# Patient Record
Sex: Female | Born: 1960
Health system: Southern US, Community
[De-identification: ages and names within clinical notes are randomized; demographics above are authoritative.]

## PROBLEM LIST (undated history)

## (undated) DIAGNOSIS — J449 Chronic obstructive pulmonary disease, unspecified: Secondary | ICD-10-CM

## (undated) DIAGNOSIS — I10 Essential (primary) hypertension: Secondary | ICD-10-CM

## (undated) DIAGNOSIS — I471 Supraventricular tachycardia, unspecified: Secondary | ICD-10-CM

## (undated) DIAGNOSIS — F32A Depression, unspecified: Secondary | ICD-10-CM

## (undated) DIAGNOSIS — J189 Pneumonia, unspecified organism: Secondary | ICD-10-CM

## (undated) DIAGNOSIS — R011 Cardiac murmur, unspecified: Secondary | ICD-10-CM

## (undated) DIAGNOSIS — Z86711 Personal history of pulmonary embolism: Secondary | ICD-10-CM

## (undated) DIAGNOSIS — F419 Anxiety disorder, unspecified: Secondary | ICD-10-CM

## (undated) DIAGNOSIS — F329 Major depressive disorder, single episode, unspecified: Secondary | ICD-10-CM

## (undated) DIAGNOSIS — I739 Peripheral vascular disease, unspecified: Secondary | ICD-10-CM

## (undated) DIAGNOSIS — I8290 Acute embolism and thrombosis of unspecified vein: Secondary | ICD-10-CM

## (undated) DIAGNOSIS — M545 Low back pain, unspecified: Secondary | ICD-10-CM

## (undated) DIAGNOSIS — M47817 Spondylosis without myelopathy or radiculopathy, lumbosacral region: Secondary | ICD-10-CM

## (undated) DIAGNOSIS — Z8739 Personal history of other diseases of the musculoskeletal system and connective tissue: Secondary | ICD-10-CM

## (undated) DIAGNOSIS — K219 Gastro-esophageal reflux disease without esophagitis: Secondary | ICD-10-CM

## (undated) DIAGNOSIS — M7661 Achilles tendinitis, right leg: Secondary | ICD-10-CM

## (undated) DIAGNOSIS — R740 Nonspecific elevation of levels of transaminase and lactic acid dehydrogenase [LDH]: Secondary | ICD-10-CM

## (undated) DIAGNOSIS — G8929 Other chronic pain: Secondary | ICD-10-CM

## (undated) DIAGNOSIS — R7401 Elevation of levels of liver transaminase levels: Secondary | ICD-10-CM

## (undated) DIAGNOSIS — I499 Cardiac arrhythmia, unspecified: Secondary | ICD-10-CM

## (undated) HISTORY — DX: Spondylosis without myelopathy or radiculopathy, lumbosacral region: M47.817

## (undated) HISTORY — DX: Nonspecific elevation of levels of transaminase and lactic acid dehydrogenase (ldh): R74.0

## (undated) HISTORY — DX: Low back pain: M54.5

## (undated) HISTORY — DX: Elevation of levels of liver transaminase levels: R74.01

## (undated) HISTORY — PX: TONSILLECTOMY: SUR1361

## (undated) HISTORY — PX: OTHER SURGICAL HISTORY: SHX169

## (undated) HISTORY — DX: Supraventricular tachycardia, unspecified: I47.10

## (undated) HISTORY — DX: Chronic obstructive pulmonary disease, unspecified: J44.9

## (undated) HISTORY — DX: Other chronic pain: G89.29

## (undated) HISTORY — PX: ELBOW SURGERY: SHX618

## (undated) HISTORY — DX: Achilles tendinitis, right leg: M76.61

## (undated) HISTORY — PX: ABDOMINAL HYSTERECTOMY: SHX81

## (undated) HISTORY — DX: Personal history of pulmonary embolism: Z86.711

## (undated) HISTORY — DX: Supraventricular tachycardia: I47.1

## (undated) HISTORY — DX: Low back pain, unspecified: M54.50

## (undated) HISTORY — DX: Personal history of other diseases of the musculoskeletal system and connective tissue: Z87.39

---

## 2002-02-18 ENCOUNTER — Emergency Department (HOSPITAL_COMMUNITY): Admission: EM | Admit: 2002-02-18 | Discharge: 2002-02-19 | Payer: Self-pay | Admitting: *Deleted

## 2005-05-14 ENCOUNTER — Emergency Department: Payer: Self-pay | Admitting: Emergency Medicine

## 2005-05-14 ENCOUNTER — Other Ambulatory Visit: Payer: Self-pay

## 2005-11-15 ENCOUNTER — Emergency Department: Payer: Self-pay | Admitting: Unknown Physician Specialty

## 2005-11-15 ENCOUNTER — Other Ambulatory Visit: Payer: Self-pay

## 2006-10-26 ENCOUNTER — Emergency Department: Payer: Self-pay | Admitting: Unknown Physician Specialty

## 2006-10-29 ENCOUNTER — Inpatient Hospital Stay: Payer: Self-pay | Admitting: Internal Medicine

## 2006-10-29 ENCOUNTER — Other Ambulatory Visit: Payer: Self-pay

## 2006-10-30 ENCOUNTER — Other Ambulatory Visit: Payer: Self-pay

## 2006-11-01 ENCOUNTER — Inpatient Hospital Stay: Payer: Self-pay | Admitting: Unknown Physician Specialty

## 2007-06-05 ENCOUNTER — Emergency Department: Payer: Self-pay | Admitting: Emergency Medicine

## 2007-06-05 ENCOUNTER — Other Ambulatory Visit: Payer: Self-pay

## 2007-06-10 ENCOUNTER — Emergency Department: Payer: Self-pay | Admitting: Emergency Medicine

## 2007-06-19 ENCOUNTER — Ambulatory Visit: Payer: Self-pay | Admitting: Pain Medicine

## 2007-08-02 ENCOUNTER — Ambulatory Visit: Payer: Self-pay | Admitting: Family Medicine

## 2008-09-25 ENCOUNTER — Emergency Department: Payer: Self-pay

## 2009-01-31 ENCOUNTER — Emergency Department: Payer: Self-pay | Admitting: Internal Medicine

## 2009-07-23 ENCOUNTER — Emergency Department: Payer: Self-pay | Admitting: Emergency Medicine

## 2009-09-03 ENCOUNTER — Ambulatory Visit: Payer: Self-pay | Admitting: Family Medicine

## 2010-08-17 ENCOUNTER — Inpatient Hospital Stay: Payer: Self-pay | Admitting: Internal Medicine

## 2010-09-04 ENCOUNTER — Emergency Department: Payer: Self-pay | Admitting: Emergency Medicine

## 2010-09-14 ENCOUNTER — Ambulatory Visit: Payer: Self-pay | Admitting: Internal Medicine

## 2010-10-21 ENCOUNTER — Ambulatory Visit: Payer: Self-pay | Admitting: Obstetrics and Gynecology

## 2010-11-02 ENCOUNTER — Ambulatory Visit: Payer: Self-pay

## 2010-12-24 ENCOUNTER — Emergency Department: Payer: Self-pay | Admitting: Unknown Physician Specialty

## 2011-01-20 ENCOUNTER — Ambulatory Visit: Payer: Self-pay | Admitting: Internal Medicine

## 2011-01-20 ENCOUNTER — Emergency Department: Payer: Self-pay | Admitting: Emergency Medicine

## 2011-05-02 ENCOUNTER — Ambulatory Visit: Payer: Self-pay | Admitting: Internal Medicine

## 2011-05-02 ENCOUNTER — Ambulatory Visit: Payer: Self-pay

## 2011-05-12 ENCOUNTER — Emergency Department: Payer: Self-pay | Admitting: Emergency Medicine

## 2011-05-16 ENCOUNTER — Ambulatory Visit: Payer: Self-pay | Admitting: Internal Medicine

## 2011-06-15 ENCOUNTER — Ambulatory Visit: Payer: Self-pay | Admitting: Internal Medicine

## 2011-07-16 ENCOUNTER — Ambulatory Visit: Payer: Self-pay | Admitting: Internal Medicine

## 2011-08-03 ENCOUNTER — Observation Stay: Payer: Self-pay | Admitting: Specialist

## 2011-09-14 ENCOUNTER — Ambulatory Visit: Payer: Self-pay | Admitting: Internal Medicine

## 2011-09-15 ENCOUNTER — Ambulatory Visit: Payer: Self-pay | Admitting: Internal Medicine

## 2011-10-15 ENCOUNTER — Ambulatory Visit: Payer: Self-pay | Admitting: Internal Medicine

## 2011-11-15 DIAGNOSIS — I739 Peripheral vascular disease, unspecified: Secondary | ICD-10-CM

## 2011-11-15 HISTORY — DX: Peripheral vascular disease, unspecified: I73.9

## 2012-05-17 ENCOUNTER — Emergency Department: Payer: Self-pay | Admitting: *Deleted

## 2012-05-20 ENCOUNTER — Emergency Department: Payer: Self-pay | Admitting: Emergency Medicine

## 2012-05-20 LAB — COMPREHENSIVE METABOLIC PANEL
Anion Gap: 7 (ref 7–16)
BUN: 9 mg/dL (ref 7–18)
Bilirubin,Total: 0.4 mg/dL (ref 0.2–1.0)
Chloride: 109 mmol/L — ABNORMAL HIGH (ref 98–107)
Creatinine: 1.13 mg/dL (ref 0.60–1.30)
EGFR (African American): >60
Osmolality: 276 (ref 275–301)
Potassium: 3.3 mmol/L — ABNORMAL LOW (ref 3.5–5.1)
SGPT (ALT): 39 U/L
Sodium: 139 mmol/L (ref 136–145)
Total Protein: 7 g/dL (ref 6.4–8.2)

## 2012-05-20 LAB — CBC
HCT: 35.1 % (ref 35.0–47.0)
MCHC: 34.1 g/dL (ref 32.0–36.0)
RBC: 3.88 10*6/uL (ref 3.80–5.20)
RDW: 13.9 % (ref 11.5–14.5)
WBC: 7.6 10*3/uL (ref 3.6–11.0)

## 2012-08-01 ENCOUNTER — Ambulatory Visit: Payer: Self-pay | Admitting: Urology

## 2012-08-02 ENCOUNTER — Ambulatory Visit: Payer: Self-pay

## 2012-08-10 ENCOUNTER — Ambulatory Visit: Payer: Self-pay | Admitting: Urology

## 2012-10-03 ENCOUNTER — Emergency Department: Payer: Self-pay | Admitting: Internal Medicine

## 2012-10-03 LAB — CBC
HCT: 36.4 % (ref 35.0–47.0)
HGB: 12.1 g/dL (ref 12.0–16.0)
MCH: 27.3 pg (ref 26.0–34.0)
MCHC: 33.3 g/dL (ref 32.0–36.0)
MCV: 82 fL (ref 80–100)
Platelet: 277 10*3/uL (ref 150–440)
RBC: 4.44 10*6/uL (ref 3.80–5.20)
RDW: 15.6 % — ABNORMAL HIGH (ref 11.5–14.5)
WBC: 8.3 10*3/uL (ref 3.6–11.0)

## 2012-10-03 LAB — URINALYSIS, COMPLETE
Nitrite: NEGATIVE
Protein: NEGATIVE
RBC,UR: 3 /HPF (ref 0–5)
Specific Gravity: 1.01 (ref 1.003–1.030)
WBC UR: 8 /HPF (ref 0–5)

## 2012-10-03 LAB — COMPREHENSIVE METABOLIC PANEL
Albumin: 3.7 g/dL (ref 3.4–5.0)
Alkaline Phosphatase: 130 U/L (ref 50–136)
BUN: 6 mg/dL — ABNORMAL LOW (ref 7–18)
Bilirubin,Total: 0.4 mg/dL (ref 0.2–1.0)
Co2: 21 mmol/L (ref 21–32)
Glucose: 128 mg/dL — ABNORMAL HIGH (ref 65–99)
Osmolality: 271 (ref 275–301)
SGOT(AST): 27 U/L (ref 15–37)
Sodium: 136 mmol/L (ref 136–145)
Total Protein: 7.9 g/dL (ref 6.4–8.2)

## 2012-10-03 LAB — DRUG SCREEN, URINE
Amphetamines, Ur Screen: NEGATIVE (ref ?–1000)
Barbiturates, Ur Screen: NEGATIVE (ref ?–200)
Cannabinoid 50 Ng, Ur ~~LOC~~: NEGATIVE (ref ?–50)
Methadone, Ur Screen: NEGATIVE (ref ?–300)
Opiate, Ur Screen: POSITIVE (ref ?–300)
Phencyclidine (PCP) Ur S: NEGATIVE (ref ?–25)
Tricyclic, Ur Screen: NEGATIVE (ref ?–1000)

## 2012-10-04 LAB — URINE CULTURE

## 2012-10-24 ENCOUNTER — Ambulatory Visit: Payer: Self-pay | Admitting: Urology

## 2012-10-24 LAB — POTASSIUM: Potassium: 4.1 mmol/L (ref 3.5–5.1)

## 2012-10-29 ENCOUNTER — Ambulatory Visit: Payer: Self-pay | Admitting: Urology

## 2012-11-02 ENCOUNTER — Emergency Department: Payer: Self-pay | Admitting: Emergency Medicine

## 2012-11-02 LAB — URINALYSIS, COMPLETE
Bilirubin,UR: NEGATIVE
Glucose,UR: NEGATIVE mg/dL (ref 0–75)
Ketone: NEGATIVE
Nitrite: POSITIVE
Protein: 30
RBC,UR: 105 /HPF (ref 0–5)
WBC UR: 56 /HPF (ref 0–5)

## 2012-11-02 LAB — COMPREHENSIVE METABOLIC PANEL
Albumin: 3.6 g/dL (ref 3.4–5.0)
Alkaline Phosphatase: 101 U/L (ref 50–136)
Anion Gap: 6 — ABNORMAL LOW (ref 7–16)
BUN: 9 mg/dL (ref 7–18)
Calcium, Total: 8.8 mg/dL (ref 8.5–10.1)
Co2: 21 mmol/L (ref 21–32)
EGFR (Non-African Amer.): >60
Glucose: 99 mg/dL (ref 65–99)
Osmolality: 280 (ref 275–301)
SGPT (ALT): 27 U/L (ref 12–78)

## 2012-11-02 LAB — CBC
MCV: 82 fL (ref 80–100)
Platelet: 375 10*3/uL (ref 150–440)
RBC: 4.14 10*6/uL (ref 3.80–5.20)
WBC: 8.3 10*3/uL (ref 3.6–11.0)

## 2012-11-02 LAB — LIPASE, BLOOD: Lipase: 89 U/L (ref 73–393)

## 2013-01-08 ENCOUNTER — Ambulatory Visit: Payer: Medicare Other | Admitting: Family Medicine

## 2013-01-08 DIAGNOSIS — Z0289 Encounter for other administrative examinations: Secondary | ICD-10-CM

## 2013-05-21 ENCOUNTER — Emergency Department: Payer: Self-pay | Admitting: Emergency Medicine

## 2013-05-21 LAB — URINALYSIS, COMPLETE
Bilirubin,UR: NEGATIVE
Glucose,UR: NEGATIVE mg/dL (ref 0–75)
Ketone: NEGATIVE
Ph: 5 (ref 4.5–8.0)
Protein: 30
RBC,UR: 4 /HPF (ref 0–5)
Specific Gravity: 1.017 (ref 1.003–1.030)
Squamous Epithelial: 2

## 2013-05-21 LAB — TROPONIN I: Troponin-I: <0.02 ng/mL

## 2013-05-21 LAB — COMPREHENSIVE METABOLIC PANEL
Albumin: 3.4 g/dL (ref 3.4–5.0)
Alkaline Phosphatase: 86 U/L (ref 50–136)
BUN: 10 mg/dL (ref 7–18)
Bilirubin,Total: 0.3 mg/dL (ref 0.2–1.0)
Co2: 26 mmol/L (ref 21–32)
EGFR (African American): >60
Glucose: 108 mg/dL — ABNORMAL HIGH (ref 65–99)
SGOT(AST): 17 U/L (ref 15–37)
SGPT (ALT): 15 U/L (ref 12–78)
Sodium: 138 mmol/L (ref 136–145)
Total Protein: 7.5 g/dL (ref 6.4–8.2)

## 2013-05-21 LAB — CBC
HCT: 26.3 % — ABNORMAL LOW (ref 35.0–47.0)
HGB: 7.9 g/dL — ABNORMAL LOW (ref 12.0–16.0)
MCH: 21.8 pg — ABNORMAL LOW (ref 26.0–34.0)
Platelet: 383 10*3/uL (ref 150–440)
RBC: 3.6 10*6/uL — ABNORMAL LOW (ref 3.80–5.20)
RDW: 18.7 % — ABNORMAL HIGH (ref 11.5–14.5)
WBC: 15.2 10*3/uL — ABNORMAL HIGH (ref 3.6–11.0)

## 2013-05-23 LAB — URINE CULTURE

## 2013-07-18 ENCOUNTER — Emergency Department: Payer: Self-pay | Admitting: Emergency Medicine

## 2013-07-18 LAB — BASIC METABOLIC PANEL
Anion Gap: 7 (ref 7–16)
BUN: 13 mg/dL (ref 7–18)
Calcium, Total: 7.8 mg/dL — ABNORMAL LOW (ref 8.5–10.1)
Chloride: 107 mmol/L (ref 98–107)
Co2: 25 mmol/L (ref 21–32)
EGFR (African American): >60
Glucose: 104 mg/dL — ABNORMAL HIGH (ref 65–99)
Potassium: 3.3 mmol/L — ABNORMAL LOW (ref 3.5–5.1)
Sodium: 139 mmol/L (ref 136–145)

## 2013-07-18 LAB — CBC
MCV: 69 fL — ABNORMAL LOW (ref 80–100)
Platelet: 351 10*3/uL (ref 150–440)
RBC: 3.38 10*6/uL — ABNORMAL LOW (ref 3.80–5.20)

## 2013-07-18 LAB — TROPONIN I: Troponin-I: <0.02 ng/mL

## 2013-08-16 ENCOUNTER — Emergency Department: Payer: Self-pay | Admitting: Emergency Medicine

## 2013-09-16 ENCOUNTER — Emergency Department: Payer: Self-pay | Admitting: Emergency Medicine

## 2013-09-17 LAB — ETHANOL
Ethanol %: 0.173 % — ABNORMAL HIGH (ref 0.000–0.080)
Ethanol: 173 mg/dL

## 2013-09-17 LAB — URINALYSIS, COMPLETE
Bilirubin,UR: NEGATIVE
Glucose,UR: NEGATIVE mg/dL (ref 0–75)
Ketone: NEGATIVE
Leukocyte Esterase: NEGATIVE
Ph: 5 (ref 4.5–8.0)
Protein: NEGATIVE
RBC,UR: <1 /HPF (ref 0–5)
Specific Gravity: 1.008 (ref 1.003–1.030)
Squamous Epithelial: <1
WBC UR: 1 /HPF (ref 0–5)

## 2013-09-17 LAB — COMPREHENSIVE METABOLIC PANEL
Albumin: 3.6 g/dL (ref 3.4–5.0)
Alkaline Phosphatase: 83 U/L (ref 50–136)
Anion Gap: 6 — ABNORMAL LOW (ref 7–16)
BUN: 8 mg/dL (ref 7–18)
Bilirubin,Total: 0.1 mg/dL — ABNORMAL LOW (ref 0.2–1.0)
Co2: 22 mmol/L (ref 21–32)
EGFR (African American): >60
Glucose: 82 mg/dL (ref 65–99)
Osmolality: 282 (ref 275–301)
Potassium: 3.8 mmol/L (ref 3.5–5.1)
SGPT (ALT): 20 U/L (ref 12–78)

## 2013-09-17 LAB — CBC
HCT: 24.9 % — ABNORMAL LOW (ref 35.0–47.0)
HGB: 8 g/dL — ABNORMAL LOW (ref 12.0–16.0)
MCH: 21 pg — ABNORMAL LOW (ref 26.0–34.0)
Platelet: 352 10*3/uL (ref 150–440)
RBC: 3.82 10*6/uL (ref 3.80–5.20)
WBC: 5.7 10*3/uL (ref 3.6–11.0)

## 2013-11-25 ENCOUNTER — Emergency Department: Payer: Self-pay | Admitting: Emergency Medicine

## 2013-11-25 LAB — CBC WITH DIFFERENTIAL/PLATELET
Basophil #: 0.1 10*3/uL (ref 0.0–0.1)
Basophil %: 1.1 %
Eosinophil #: 0 10*3/uL (ref 0.0–0.7)
Eosinophil %: 0.1 %
HCT: 26 % — AB (ref 35.0–47.0)
HGB: 8.2 g/dL — AB (ref 12.0–16.0)
Lymphocyte #: 3.1 10*3/uL (ref 1.0–3.6)
Lymphocyte %: 32.9 %
MCH: 20.1 pg — ABNORMAL LOW (ref 26.0–34.0)
MCHC: 31.6 g/dL — AB (ref 32.0–36.0)
MCV: 64 fL — ABNORMAL LOW (ref 80–100)
Monocyte #: 0.6 x10 3/mm (ref 0.2–0.9)
Monocyte %: 6.3 %
NEUTROS ABS: 5.6 10*3/uL (ref 1.4–6.5)
NEUTROS PCT: 59.6 %
PLATELETS: 354 10*3/uL (ref 150–440)
RBC: 4.1 10*6/uL (ref 3.80–5.20)
RDW: 20.2 % — ABNORMAL HIGH (ref 11.5–14.5)
WBC: 9.4 10*3/uL (ref 3.6–11.0)

## 2013-11-25 LAB — BASIC METABOLIC PANEL
ANION GAP: 6 — AB (ref 7–16)
BUN: 9 mg/dL (ref 7–18)
CALCIUM: 8.3 mg/dL — AB (ref 8.5–10.1)
Chloride: 109 mmol/L — ABNORMAL HIGH (ref 98–107)
Co2: 24 mmol/L (ref 21–32)
Creatinine: 0.73 mg/dL (ref 0.60–1.30)
EGFR (African American): >60
EGFR (Non-African Amer.): >60
Glucose: 102 mg/dL — ABNORMAL HIGH (ref 65–99)
OSMOLALITY: 276 (ref 275–301)
Potassium: 3.7 mmol/L (ref 3.5–5.1)
Sodium: 139 mmol/L (ref 136–145)

## 2013-11-25 LAB — RAPID INFLUENZA A&B ANTIGENS

## 2013-11-25 LAB — TROPONIN I

## 2013-11-27 ENCOUNTER — Emergency Department: Payer: Self-pay | Admitting: Emergency Medicine

## 2013-11-27 LAB — URINALYSIS, COMPLETE
BACTERIA: NONE SEEN
Bilirubin,UR: NEGATIVE
Blood: NEGATIVE
Glucose,UR: NEGATIVE mg/dL (ref 0–75)
Ketone: NEGATIVE
LEUKOCYTE ESTERASE: NEGATIVE
NITRITE: NEGATIVE
Ph: 6 (ref 4.5–8.0)
Protein: NEGATIVE
RBC,UR: <1 /HPF (ref 0–5)
Specific Gravity: 1.005 (ref 1.003–1.030)
Squamous Epithelial: 2
WBC UR: NONE SEEN /HPF (ref 0–5)

## 2013-11-27 LAB — CBC WITH DIFFERENTIAL/PLATELET
Basophil #: 0.1 10*3/uL (ref 0.0–0.1)
Basophil %: 0.5 %
Eosinophil #: 0 10*3/uL (ref 0.0–0.7)
Eosinophil %: 0 %
HCT: 24.9 % — ABNORMAL LOW (ref 35.0–47.0)
HGB: 7.7 g/dL — ABNORMAL LOW (ref 12.0–16.0)
LYMPHS ABS: 1.6 10*3/uL (ref 1.0–3.6)
Lymphocyte %: 10.9 %
MCH: 19.7 pg — ABNORMAL LOW (ref 26.0–34.0)
MCHC: 31 g/dL — AB (ref 32.0–36.0)
MCV: 64 fL — ABNORMAL LOW (ref 80–100)
MONOS PCT: 1.9 %
Monocyte #: 0.3 x10 3/mm (ref 0.2–0.9)
Neutrophil #: 13 10*3/uL — ABNORMAL HIGH (ref 1.4–6.5)
Neutrophil %: 86.7 %
Platelet: 424 10*3/uL (ref 150–440)
RBC: 3.92 10*6/uL (ref 3.80–5.20)
RDW: 20.2 % — ABNORMAL HIGH (ref 11.5–14.5)
WBC: 15 10*3/uL — ABNORMAL HIGH (ref 3.6–11.0)

## 2013-11-27 LAB — BASIC METABOLIC PANEL
Anion Gap: 7 (ref 7–16)
BUN: 7 mg/dL (ref 7–18)
CO2: 22 mmol/L (ref 21–32)
Calcium, Total: 8.8 mg/dL (ref 8.5–10.1)
Chloride: 108 mmol/L — ABNORMAL HIGH (ref 98–107)
Creatinine: 0.75 mg/dL (ref 0.60–1.30)
EGFR (African American): >60
Glucose: 88 mg/dL (ref 65–99)
OSMOLALITY: 271 (ref 275–301)
Potassium: 4.1 mmol/L (ref 3.5–5.1)
Sodium: 137 mmol/L (ref 136–145)

## 2013-11-27 LAB — TROPONIN I: Troponin-I: <0.02 ng/mL

## 2014-01-15 ENCOUNTER — Ambulatory Visit: Payer: Self-pay | Admitting: Internal Medicine

## 2014-01-15 LAB — CANCER CENTER HEMOGLOBIN: HGB: 8 g/dL — ABNORMAL LOW (ref 12.0–16.0)

## 2014-01-15 LAB — FERRITIN: Ferritin (ARMC): 4 ng/mL — ABNORMAL LOW (ref 8–388)

## 2014-01-24 ENCOUNTER — Emergency Department: Payer: Self-pay | Admitting: Emergency Medicine

## 2014-01-24 LAB — CBC WITH DIFFERENTIAL/PLATELET
BASOS ABS: 0.1 10*3/uL (ref 0.0–0.1)
BASOS PCT: 1.4 %
EOS PCT: 0 %
Eosinophil #: 0 10*3/uL (ref 0.0–0.7)
HCT: 29.5 % — AB (ref 35.0–47.0)
HGB: 9.3 g/dL — AB (ref 12.0–16.0)
Lymphocyte #: 1.8 10*3/uL (ref 1.0–3.6)
Lymphocyte %: 39.8 %
MCH: 21.8 pg — ABNORMAL LOW (ref 26.0–34.0)
MCHC: 31.4 g/dL — AB (ref 32.0–36.0)
MCV: 70 fL — ABNORMAL LOW (ref 80–100)
Monocyte #: 0.3 x10 3/mm (ref 0.2–0.9)
Monocyte %: 7.7 %
Neutrophil #: 2.3 10*3/uL (ref 1.4–6.5)
Neutrophil %: 51.1 %
PLATELETS: 359 10*3/uL (ref 150–440)
RBC: 4.24 10*6/uL (ref 3.80–5.20)
RDW: 28 % — AB (ref 11.5–14.5)
WBC: 4.5 10*3/uL (ref 3.6–11.0)

## 2014-01-24 LAB — COMPREHENSIVE METABOLIC PANEL
ALT: 21 U/L (ref 12–78)
Albumin: 4 g/dL (ref 3.4–5.0)
Alkaline Phosphatase: 73 U/L
Anion Gap: 6 — ABNORMAL LOW (ref 7–16)
BUN: 9 mg/dL (ref 7–18)
Bilirubin,Total: 0.2 mg/dL (ref 0.2–1.0)
CHLORIDE: 111 mmol/L — AB (ref 98–107)
Calcium, Total: 8.3 mg/dL — ABNORMAL LOW (ref 8.5–10.1)
Co2: 24 mmol/L (ref 21–32)
Creatinine: 0.78 mg/dL (ref 0.60–1.30)
EGFR (African American): >60
EGFR (Non-African Amer.): >60
Glucose: 84 mg/dL (ref 65–99)
OSMOLALITY: 279 (ref 275–301)
Potassium: 3.9 mmol/L (ref 3.5–5.1)
SGOT(AST): 21 U/L (ref 15–37)
Sodium: 141 mmol/L (ref 136–145)
Total Protein: 7.4 g/dL (ref 6.4–8.2)

## 2014-01-24 LAB — URINALYSIS, COMPLETE
Bilirubin,UR: NEGATIVE
Glucose,UR: NEGATIVE mg/dL (ref 0–75)
KETONE: NEGATIVE
Nitrite: NEGATIVE
PH: 5 (ref 4.5–8.0)
Protein: NEGATIVE
RBC,UR: 1 /HPF (ref 0–5)
Specific Gravity: 1.023 (ref 1.003–1.030)
Squamous Epithelial: 7

## 2014-01-24 LAB — CANCER CENTER HEMOGLOBIN: HGB: 8.6 g/dL — ABNORMAL LOW (ref 12.0–16.0)

## 2014-01-24 LAB — FERRITIN: Ferritin (ARMC): 104 ng/mL (ref 8–388)

## 2014-01-26 LAB — URINE CULTURE

## 2014-02-03 LAB — CANCER CENTER HEMOGLOBIN: HGB: 9.3 g/dL — AB (ref 12.0–16.0)

## 2014-02-03 LAB — FERRITIN: FERRITIN (ARMC): 37 ng/mL (ref 8–388)

## 2014-02-04 LAB — CBC CANCER CENTER
Basophil #: 0.1 x10 3/mm (ref 0.0–0.1)
Basophil %: 1.7 %
EOS PCT: 0 %
Eosinophil #: 0 x10 3/mm (ref 0.0–0.7)
HCT: 32.1 % — AB (ref 35.0–47.0)
HGB: 9.8 g/dL — ABNORMAL LOW (ref 12.0–16.0)
LYMPHS PCT: 42.6 %
Lymphocyte #: 2.3 x10 3/mm (ref 1.0–3.6)
MCH: 21.8 pg — AB (ref 26.0–34.0)
MCHC: 30.5 g/dL — ABNORMAL LOW (ref 32.0–36.0)
MCV: 72 fL — ABNORMAL LOW (ref 80–100)
Monocyte #: 0.4 x10 3/mm (ref 0.2–0.9)
Monocyte %: 7.2 %
NEUTROS PCT: 48.5 %
Neutrophil #: 2.6 x10 3/mm (ref 1.4–6.5)
Platelet: 538 x10 3/mm — ABNORMAL HIGH (ref 150–440)
RBC: 4.49 10*6/uL (ref 3.80–5.20)
RDW: 30 % — ABNORMAL HIGH (ref 11.5–14.5)
WBC: 5.4 x10 3/mm (ref 3.6–11.0)

## 2014-02-11 LAB — PATHOLOGY REPORT

## 2014-02-12 ENCOUNTER — Ambulatory Visit: Payer: Self-pay | Admitting: Internal Medicine

## 2014-02-13 ENCOUNTER — Ambulatory Visit: Payer: Self-pay | Admitting: Internal Medicine

## 2014-02-13 LAB — CANCER CENTER HEMOGLOBIN: HGB: 10.3 g/dL — ABNORMAL LOW (ref 12.0–16.0)

## 2014-02-13 LAB — FERRITIN: FERRITIN (ARMC): 14 ng/mL (ref 8–388)

## 2014-03-05 ENCOUNTER — Ambulatory Visit: Payer: Self-pay | Admitting: Gynecologic Oncology

## 2014-03-05 LAB — BASIC METABOLIC PANEL
Anion Gap: 6 — ABNORMAL LOW (ref 7–16)
BUN: 6 mg/dL — ABNORMAL LOW (ref 7–18)
CREATININE: 0.74 mg/dL (ref 0.60–1.30)
Calcium, Total: 9.4 mg/dL (ref 8.5–10.1)
Chloride: 109 mmol/L — ABNORMAL HIGH (ref 98–107)
Co2: 25 mmol/L (ref 21–32)
EGFR (Non-African Amer.): >60
GLUCOSE: 82 mg/dL (ref 65–99)
Osmolality: 276 (ref 275–301)
POTASSIUM: 3.7 mmol/L (ref 3.5–5.1)
Sodium: 140 mmol/L (ref 136–145)

## 2014-03-05 LAB — CBC
HCT: 37.6 % (ref 35.0–47.0)
HGB: 11.7 g/dL — ABNORMAL LOW (ref 12.0–16.0)
MCH: 23.2 pg — ABNORMAL LOW (ref 26.0–34.0)
MCHC: 31.1 g/dL — ABNORMAL LOW (ref 32.0–36.0)
MCV: 75 fL — ABNORMAL LOW (ref 80–100)
PLATELETS: 395 10*3/uL (ref 150–440)
RBC: 5.05 10*6/uL (ref 3.80–5.20)
RDW: 24.4 % — ABNORMAL HIGH (ref 11.5–14.5)
WBC: 7 10*3/uL (ref 3.6–11.0)

## 2014-03-11 ENCOUNTER — Ambulatory Visit: Payer: Self-pay | Admitting: Gynecologic Oncology

## 2014-03-13 LAB — PATHOLOGY REPORT

## 2014-03-14 ENCOUNTER — Ambulatory Visit: Payer: Self-pay | Admitting: Internal Medicine

## 2014-03-25 ENCOUNTER — Ambulatory Visit: Payer: Self-pay | Admitting: Gynecologic Oncology

## 2014-03-25 LAB — CBC
HCT: 33.7 % — AB (ref 35.0–47.0)
HGB: 10.6 g/dL — AB (ref 12.0–16.0)
MCH: 24 pg — ABNORMAL LOW (ref 26.0–34.0)
MCHC: 31.5 g/dL — ABNORMAL LOW (ref 32.0–36.0)
MCV: 76 fL — AB (ref 80–100)
Platelet: 354 10*3/uL (ref 150–440)
RBC: 4.43 10*6/uL (ref 3.80–5.20)
RDW: 21.8 % — AB (ref 11.5–14.5)
WBC: 5.7 10*3/uL (ref 3.6–11.0)

## 2014-03-25 LAB — BASIC METABOLIC PANEL
Anion Gap: 6 — ABNORMAL LOW (ref 7–16)
BUN: 8 mg/dL (ref 7–18)
CALCIUM: 8.6 mg/dL (ref 8.5–10.1)
CO2: 25 mmol/L (ref 21–32)
Chloride: 108 mmol/L — ABNORMAL HIGH (ref 98–107)
Creatinine: 1.01 mg/dL (ref 0.60–1.30)
EGFR (Non-African Amer.): >60
GLUCOSE: 85 mg/dL (ref 65–99)
Osmolality: 275 (ref 275–301)
POTASSIUM: 4.4 mmol/L (ref 3.5–5.1)
SODIUM: 139 mmol/L (ref 136–145)

## 2014-03-26 LAB — HEMOGLOBIN: HGB: 10 g/dL — AB (ref 12.0–16.0)

## 2014-03-31 LAB — PATHOLOGY REPORT

## 2014-04-08 ENCOUNTER — Ambulatory Visit: Payer: Self-pay | Admitting: Internal Medicine

## 2014-04-08 LAB — URINALYSIS, COMPLETE
BACTERIA: NONE SEEN
BILIRUBIN, UR: NEGATIVE
Glucose,UR: NEGATIVE mg/dL (ref 0–75)
KETONE: NEGATIVE
Nitrite: NEGATIVE
Ph: 5 (ref 4.5–8.0)
Protein: NEGATIVE
RBC,UR: 13 /HPF (ref 0–5)
Specific Gravity: 1.024 (ref 1.003–1.030)
Squamous Epithelial: 5

## 2014-04-10 LAB — URINE CULTURE

## 2014-04-14 ENCOUNTER — Ambulatory Visit: Payer: Self-pay | Admitting: Internal Medicine

## 2014-05-15 ENCOUNTER — Ambulatory Visit: Payer: Self-pay | Admitting: Internal Medicine

## 2014-06-09 ENCOUNTER — Observation Stay: Payer: Self-pay | Admitting: Internal Medicine

## 2014-06-09 LAB — BASIC METABOLIC PANEL
Anion Gap: 10 (ref 7–16)
BUN: 8 mg/dL (ref 7–18)
Calcium, Total: 8.6 mg/dL (ref 8.5–10.1)
Chloride: 107 mmol/L (ref 98–107)
Co2: 24 mmol/L (ref 21–32)
Creatinine: 0.77 mg/dL (ref 0.60–1.30)
EGFR (African American): >60
EGFR (Non-African Amer.): >60
GLUCOSE: 94 mg/dL (ref 65–99)
Osmolality: 279 (ref 275–301)
Potassium: 3.9 mmol/L (ref 3.5–5.1)
Sodium: 141 mmol/L (ref 136–145)

## 2014-06-09 LAB — TROPONIN I
Troponin-I: <0.02 ng/mL
Troponin-I: <0.02 ng/mL
Troponin-I: <0.02 ng/mL

## 2014-06-09 LAB — CBC
HCT: 33.3 % — ABNORMAL LOW (ref 35.0–47.0)
HGB: 10.7 g/dL — ABNORMAL LOW (ref 12.0–16.0)
MCH: 25.2 pg — ABNORMAL LOW (ref 26.0–34.0)
MCHC: 32.1 g/dL (ref 32.0–36.0)
MCV: 79 fL — ABNORMAL LOW (ref 80–100)
Platelet: 377 10*3/uL (ref 150–440)
RBC: 4.24 10*6/uL (ref 3.80–5.20)
RDW: 19.6 % — ABNORMAL HIGH (ref 11.5–14.5)
WBC: 10.5 10*3/uL (ref 3.6–11.0)

## 2014-06-09 LAB — PROTIME-INR
INR: 1.2
PROTHROMBIN TIME: 15.2 s — AB (ref 11.5–14.7)

## 2014-06-09 LAB — PRO B NATRIURETIC PEPTIDE: B-Type Natriuretic Peptide: 425 pg/mL — ABNORMAL HIGH (ref 0–125)

## 2014-06-10 DIAGNOSIS — I369 Nonrheumatic tricuspid valve disorder, unspecified: Secondary | ICD-10-CM

## 2014-06-10 LAB — BASIC METABOLIC PANEL
Anion Gap: 11 (ref 7–16)
BUN: 9 mg/dL (ref 7–18)
CHLORIDE: 106 mmol/L (ref 98–107)
Calcium, Total: 8 mg/dL — ABNORMAL LOW (ref 8.5–10.1)
Co2: 23 mmol/L (ref 21–32)
Creatinine: 0.91 mg/dL (ref 0.60–1.30)
EGFR (African American): >60
EGFR (Non-African Amer.): >60
Glucose: 87 mg/dL (ref 65–99)
Osmolality: 277 (ref 275–301)
POTASSIUM: 3.5 mmol/L (ref 3.5–5.1)
SODIUM: 140 mmol/L (ref 136–145)

## 2014-06-10 LAB — CBC WITH DIFFERENTIAL/PLATELET
BASOS ABS: 0.1 10*3/uL (ref 0.0–0.1)
BASOS PCT: 0.9 %
Eosinophil #: 0.1 10*3/uL (ref 0.0–0.7)
Eosinophil %: 0.9 %
HCT: 32.5 % — ABNORMAL LOW (ref 35.0–47.0)
HGB: 10.4 g/dL — ABNORMAL LOW (ref 12.0–16.0)
LYMPHS ABS: 2.3 10*3/uL (ref 1.0–3.6)
LYMPHS PCT: 35.6 %
MCH: 25.2 pg — ABNORMAL LOW (ref 26.0–34.0)
MCHC: 31.9 g/dL — ABNORMAL LOW (ref 32.0–36.0)
MCV: 79 fL — ABNORMAL LOW (ref 80–100)
Monocyte #: 0.4 x10 3/mm (ref 0.2–0.9)
Monocyte %: 6.6 %
NEUTROS PCT: 56 %
Neutrophil #: 3.7 10*3/uL (ref 1.4–6.5)
PLATELETS: 325 10*3/uL (ref 150–440)
RBC: 4.11 10*6/uL (ref 3.80–5.20)
RDW: 19.9 % — AB (ref 11.5–14.5)
WBC: 6.5 10*3/uL (ref 3.6–11.0)

## 2014-07-29 ENCOUNTER — Ambulatory Visit: Payer: Self-pay | Admitting: Internal Medicine

## 2014-09-19 ENCOUNTER — Inpatient Hospital Stay: Payer: Self-pay | Admitting: Internal Medicine

## 2014-09-19 LAB — COMPREHENSIVE METABOLIC PANEL
ALBUMIN: 3.8 g/dL (ref 3.4–5.0)
AST: 186 U/L — AB (ref 15–37)
Alkaline Phosphatase: 292 U/L — ABNORMAL HIGH
Anion Gap: 8 (ref 7–16)
BILIRUBIN TOTAL: 0.6 mg/dL (ref 0.2–1.0)
BUN: 8 mg/dL (ref 7–18)
CHLORIDE: 107 mmol/L (ref 98–107)
Calcium, Total: 8.8 mg/dL (ref 8.5–10.1)
Co2: 25 mmol/L (ref 21–32)
Creatinine: 0.94 mg/dL (ref 0.60–1.30)
EGFR (African American): >60
EGFR (Non-African Amer.): >60
GLUCOSE: 117 mg/dL — AB (ref 65–99)
Osmolality: 279 (ref 275–301)
Potassium: 3.7 mmol/L (ref 3.5–5.1)
SGPT (ALT): 275 U/L — ABNORMAL HIGH
SODIUM: 140 mmol/L (ref 136–145)
Total Protein: 7.9 g/dL (ref 6.4–8.2)

## 2014-09-19 LAB — CBC WITH DIFFERENTIAL/PLATELET
BASOS ABS: 0 10*3/uL (ref 0.0–0.1)
Basophil %: 0.3 %
Eosinophil #: 0 10*3/uL (ref 0.0–0.7)
Eosinophil %: 0.1 %
HCT: 42.8 % (ref 35.0–47.0)
HGB: 13.9 g/dL (ref 12.0–16.0)
LYMPHS ABS: 0.9 10*3/uL — AB (ref 1.0–3.6)
Lymphocyte %: 8 %
MCH: 27.8 pg (ref 26.0–34.0)
MCHC: 32.5 g/dL (ref 32.0–36.0)
MCV: 85 fL (ref 80–100)
MONO ABS: 0.3 x10 3/mm (ref 0.2–0.9)
Monocyte %: 2.9 %
Neutrophil #: 10 10*3/uL — ABNORMAL HIGH (ref 1.4–6.5)
Neutrophil %: 88.7 %
Platelet: 369 10*3/uL (ref 150–440)
RBC: 5.01 10*6/uL (ref 3.80–5.20)
RDW: 17.5 % — ABNORMAL HIGH (ref 11.5–14.5)
WBC: 11.3 10*3/uL — ABNORMAL HIGH (ref 3.6–11.0)

## 2014-09-19 LAB — PROTIME-INR
INR: 1
PROTHROMBIN TIME: 13.5 s (ref 11.5–14.7)

## 2014-09-19 LAB — TROPONIN I: Troponin-I: <0.02 ng/mL

## 2014-09-19 LAB — URINALYSIS, COMPLETE
BILIRUBIN, UR: NEGATIVE
Blood: NEGATIVE
GLUCOSE, UR: NEGATIVE mg/dL (ref 0–75)
Nitrite: NEGATIVE
PH: 7 (ref 4.5–8.0)
Protein: 30
Specific Gravity: 1.027 (ref 1.003–1.030)
Squamous Epithelial: 11
WBC UR: 5 /HPF (ref 0–5)

## 2014-09-19 LAB — LIPASE, BLOOD: LIPASE: 186 U/L (ref 73–393)

## 2014-09-19 LAB — APTT: Activated PTT: 28.8 secs (ref 23.6–35.9)

## 2014-09-19 LAB — ACETAMINOPHEN LEVEL: Acetaminophen: <2 ug/mL

## 2014-09-20 LAB — COMPREHENSIVE METABOLIC PANEL
ALT: 179 U/L — AB
ANION GAP: 9 (ref 7–16)
AST: 77 U/L — AB (ref 15–37)
Albumin: 3.3 g/dL — ABNORMAL LOW (ref 3.4–5.0)
Alkaline Phosphatase: 211 U/L — ABNORMAL HIGH
BUN: 7 mg/dL (ref 7–18)
Bilirubin,Total: 0.7 mg/dL (ref 0.2–1.0)
Calcium, Total: 8.1 mg/dL — ABNORMAL LOW (ref 8.5–10.1)
Chloride: 107 mmol/L (ref 98–107)
Co2: 23 mmol/L (ref 21–32)
Creatinine: 0.77 mg/dL (ref 0.60–1.30)
EGFR (African American): >60
EGFR (Non-African Amer.): >60
GLUCOSE: 103 mg/dL — AB (ref 65–99)
OSMOLALITY: 276 (ref 275–301)
Potassium: 3.1 mmol/L — ABNORMAL LOW (ref 3.5–5.1)
SODIUM: 139 mmol/L (ref 136–145)
Total Protein: 6.9 g/dL (ref 6.4–8.2)

## 2014-09-20 LAB — CBC WITH DIFFERENTIAL/PLATELET
Basophil #: 0 10*3/uL (ref 0.0–0.1)
Basophil %: 0.4 %
EOS ABS: 0 10*3/uL (ref 0.0–0.7)
Eosinophil %: 0.1 %
HCT: 39.5 % (ref 35.0–47.0)
HGB: 12.7 g/dL (ref 12.0–16.0)
Lymphocyte #: 1.3 10*3/uL (ref 1.0–3.6)
Lymphocyte %: 12.9 %
MCH: 27.6 pg (ref 26.0–34.0)
MCHC: 32.2 g/dL (ref 32.0–36.0)
MCV: 86 fL (ref 80–100)
MONOS PCT: 3.5 %
Monocyte #: 0.4 x10 3/mm (ref 0.2–0.9)
NEUTROS PCT: 83.1 %
Neutrophil #: 8.6 10*3/uL — ABNORMAL HIGH (ref 1.4–6.5)
Platelet: 334 10*3/uL (ref 150–440)
RBC: 4.61 10*6/uL (ref 3.80–5.20)
RDW: 17.4 % — AB (ref 11.5–14.5)
WBC: 10.4 10*3/uL (ref 3.6–11.0)

## 2014-09-20 LAB — MAGNESIUM: Magnesium: 2.1 mg/dL

## 2014-09-20 LAB — HEPARIN LEVEL (UNFRACTIONATED): ANTI-XA(UNFRACTIONATED): 0.6 [IU]/mL (ref 0.30–0.70)

## 2014-09-20 LAB — APTT
Activated PTT: 47.9 secs — ABNORMAL HIGH (ref 23.6–35.9)
Activated PTT: 69.4 secs — ABNORMAL HIGH (ref 23.6–35.9)

## 2014-09-21 LAB — CBC WITH DIFFERENTIAL/PLATELET
BASOS ABS: 0.1 10*3/uL (ref 0.0–0.1)
BASOS PCT: 0.7 %
EOS PCT: 2.5 %
Eosinophil #: 0.2 10*3/uL (ref 0.0–0.7)
HCT: 35.6 % (ref 35.0–47.0)
HGB: 11.8 g/dL — ABNORMAL LOW (ref 12.0–16.0)
Lymphocyte #: 2.4 10*3/uL (ref 1.0–3.6)
Lymphocyte %: 30.9 %
MCH: 28.1 pg (ref 26.0–34.0)
MCHC: 33.2 g/dL (ref 32.0–36.0)
MCV: 85 fL (ref 80–100)
MONO ABS: 0.5 x10 3/mm (ref 0.2–0.9)
MONOS PCT: 6.9 %
NEUTROS ABS: 4.5 10*3/uL (ref 1.4–6.5)
Neutrophil %: 59 %
Platelet: 285 10*3/uL (ref 150–440)
RBC: 4.19 10*6/uL (ref 3.80–5.20)
RDW: 17.2 % — AB (ref 11.5–14.5)
WBC: 7.6 10*3/uL (ref 3.6–11.0)

## 2014-09-21 LAB — HEPARIN LEVEL (UNFRACTIONATED): ANTI-XA(UNFRACTIONATED): 0.6 [IU]/mL (ref 0.30–0.70)

## 2014-09-22 LAB — CBC WITH DIFFERENTIAL/PLATELET
BASOS PCT: 0.8 %
Basophil #: 0.1 10*3/uL (ref 0.0–0.1)
Eosinophil #: 0.2 10*3/uL (ref 0.0–0.7)
Eosinophil %: 2.5 %
HCT: 32.5 % — ABNORMAL LOW (ref 35.0–47.0)
HGB: 10.9 g/dL — ABNORMAL LOW (ref 12.0–16.0)
Lymphocyte #: 2.7 10*3/uL (ref 1.0–3.6)
Lymphocyte %: 41.6 %
MCH: 28.2 pg (ref 26.0–34.0)
MCHC: 33.5 g/dL (ref 32.0–36.0)
MCV: 84 fL (ref 80–100)
Monocyte #: 0.4 x10 3/mm (ref 0.2–0.9)
Monocyte %: 6.5 %
Neutrophil #: 3.2 10*3/uL (ref 1.4–6.5)
Neutrophil %: 48.6 %
Platelet: 274 10*3/uL (ref 150–440)
RBC: 3.86 10*6/uL (ref 3.80–5.20)
RDW: 17.1 % — ABNORMAL HIGH (ref 11.5–14.5)
WBC: 6.6 10*3/uL (ref 3.6–11.0)

## 2014-09-22 LAB — HEPARIN LEVEL (UNFRACTIONATED): ANTI-XA(UNFRACTIONATED): 0.59 [IU]/mL (ref 0.30–0.70)

## 2014-09-22 LAB — COMPREHENSIVE METABOLIC PANEL
ALK PHOS: 214 U/L — AB
ALT: 106 U/L — AB
AST: 51 U/L — AB (ref 15–37)
Albumin: 3.1 g/dL — ABNORMAL LOW (ref 3.4–5.0)
Anion Gap: 8 (ref 7–16)
BUN: 5 mg/dL — ABNORMAL LOW (ref 7–18)
Bilirubin,Total: 0.2 mg/dL (ref 0.2–1.0)
CALCIUM: 8 mg/dL — AB (ref 8.5–10.1)
Chloride: 106 mmol/L (ref 98–107)
Co2: 28 mmol/L (ref 21–32)
Creatinine: 0.93 mg/dL (ref 0.60–1.30)
EGFR (African American): >60
EGFR (Non-African Amer.): >60
Glucose: 73 mg/dL (ref 65–99)
Osmolality: 279 (ref 275–301)
POTASSIUM: 3 mmol/L — AB (ref 3.5–5.1)
Sodium: 142 mmol/L (ref 136–145)
TOTAL PROTEIN: 5.6 g/dL — AB (ref 6.4–8.2)

## 2014-09-22 LAB — VANCOMYCIN, TROUGH: Vancomycin, Trough: 7 ug/mL — ABNORMAL LOW (ref 10–20)

## 2014-09-23 LAB — CREATININE, SERUM
CREATININE: 0.79 mg/dL (ref 0.60–1.30)
EGFR (African American): >60
EGFR (Non-African Amer.): >60

## 2014-09-23 LAB — CBC WITH DIFFERENTIAL/PLATELET
Basophil #: 0.1 10*3/uL (ref 0.0–0.1)
Basophil %: 0.9 %
Eosinophil #: 0.1 10*3/uL (ref 0.0–0.7)
Eosinophil %: 2.5 %
HCT: 31.9 % — ABNORMAL LOW (ref 35.0–47.0)
HGB: 10.6 g/dL — AB (ref 12.0–16.0)
LYMPHS ABS: 2.8 10*3/uL (ref 1.0–3.6)
LYMPHS PCT: 50 %
MCH: 28.1 pg (ref 26.0–34.0)
MCHC: 33.2 g/dL (ref 32.0–36.0)
MCV: 85 fL (ref 80–100)
MONOS PCT: 6.6 %
Monocyte #: 0.4 x10 3/mm (ref 0.2–0.9)
NEUTROS PCT: 40 %
Neutrophil #: 2.2 10*3/uL (ref 1.4–6.5)
Platelet: 252 10*3/uL (ref 150–440)
RBC: 3.77 10*6/uL — ABNORMAL LOW (ref 3.80–5.20)
RDW: 17 % — ABNORMAL HIGH (ref 11.5–14.5)
WBC: 5.5 10*3/uL (ref 3.6–11.0)

## 2014-09-23 LAB — HEPARIN LEVEL (UNFRACTIONATED): Anti-Xa(Unfractionated): 0.45 IU/mL (ref 0.30–0.70)

## 2014-09-24 LAB — COMPREHENSIVE METABOLIC PANEL
ALBUMIN: 3.1 g/dL — AB (ref 3.4–5.0)
ALK PHOS: 149 U/L — AB
ALT: 73 U/L — AB
AST: 23 U/L (ref 15–37)
Anion Gap: 9 (ref 7–16)
BILIRUBIN TOTAL: 0.3 mg/dL (ref 0.2–1.0)
BUN: 8 mg/dL (ref 7–18)
CALCIUM: 8.6 mg/dL (ref 8.5–10.1)
CHLORIDE: 106 mmol/L (ref 98–107)
CREATININE: 0.85 mg/dL (ref 0.60–1.30)
Co2: 26 mmol/L (ref 21–32)
EGFR (Non-African Amer.): >60
GLUCOSE: 104 mg/dL — AB (ref 65–99)
OSMOLALITY: 280 (ref 275–301)
POTASSIUM: 3.3 mmol/L — AB (ref 3.5–5.1)
Sodium: 141 mmol/L (ref 136–145)
TOTAL PROTEIN: 6.7 g/dL (ref 6.4–8.2)

## 2014-09-24 LAB — HEPARIN LEVEL (UNFRACTIONATED): Anti-Xa(Unfractionated): 0.23 IU/mL — ABNORMAL LOW (ref 0.30–0.70)

## 2014-10-04 ENCOUNTER — Emergency Department: Payer: Self-pay | Admitting: Emergency Medicine

## 2014-10-04 LAB — CBC WITH DIFFERENTIAL/PLATELET
BASOS ABS: 0.1 10*3/uL (ref 0.0–0.1)
Basophil %: 0.9 %
EOS ABS: 0.1 10*3/uL (ref 0.0–0.7)
Eosinophil %: 1.6 %
HCT: 37.9 % (ref 35.0–47.0)
HGB: 12.5 g/dL (ref 12.0–16.0)
LYMPHS PCT: 24.1 %
Lymphocyte #: 2.3 10*3/uL (ref 1.0–3.6)
MCH: 27.8 pg (ref 26.0–34.0)
MCHC: 32.9 g/dL (ref 32.0–36.0)
MCV: 84 fL (ref 80–100)
MONO ABS: 0.6 x10 3/mm (ref 0.2–0.9)
Monocyte %: 6 %
Neutrophil #: 6.4 10*3/uL (ref 1.4–6.5)
Neutrophil %: 67.4 %
PLATELETS: 341 10*3/uL (ref 150–440)
RBC: 4.49 10*6/uL (ref 3.80–5.20)
RDW: 16.8 % — ABNORMAL HIGH (ref 11.5–14.5)
WBC: 9.5 10*3/uL (ref 3.6–11.0)

## 2014-10-04 LAB — URINALYSIS, COMPLETE
Bacteria: NONE SEEN
Bilirubin,UR: NEGATIVE
Glucose,UR: NEGATIVE mg/dL (ref 0–75)
Ketone: NEGATIVE
Leukocyte Esterase: NEGATIVE
NITRITE: NEGATIVE
Ph: 6 (ref 4.5–8.0)
Protein: NEGATIVE
RBC,UR: 681 /HPF (ref 0–5)
Specific Gravity: 1.009 (ref 1.003–1.030)
Squamous Epithelial: 6

## 2014-10-04 LAB — COMPREHENSIVE METABOLIC PANEL
ALT: 28 U/L
AST: 19 U/L (ref 15–37)
Albumin: 3.4 g/dL (ref 3.4–5.0)
Alkaline Phosphatase: 83 U/L
Anion Gap: 9 (ref 7–16)
BUN: 11 mg/dL (ref 7–18)
Bilirubin,Total: 0.1 mg/dL — ABNORMAL LOW (ref 0.2–1.0)
CALCIUM: 8 mg/dL — AB (ref 8.5–10.1)
Chloride: 113 mmol/L — ABNORMAL HIGH (ref 98–107)
Co2: 22 mmol/L (ref 21–32)
Creatinine: 0.94 mg/dL (ref 0.60–1.30)
EGFR (African American): >60
EGFR (Non-African Amer.): >60
Glucose: 109 mg/dL — ABNORMAL HIGH (ref 65–99)
Osmolality: 287 (ref 275–301)
Potassium: 3.6 mmol/L (ref 3.5–5.1)
SODIUM: 144 mmol/L (ref 136–145)
TOTAL PROTEIN: 6.8 g/dL (ref 6.4–8.2)

## 2014-10-04 LAB — TROPONIN I

## 2015-01-09 ENCOUNTER — Observation Stay: Payer: Self-pay | Admitting: Internal Medicine

## 2015-01-22 ENCOUNTER — Encounter: Payer: Self-pay | Admitting: Internal Medicine

## 2015-01-26 ENCOUNTER — Emergency Department: Payer: Self-pay | Admitting: Emergency Medicine

## 2015-02-19 ENCOUNTER — Inpatient Hospital Stay: Payer: Medicare Other | Admitting: Internal Medicine

## 2015-02-19 ENCOUNTER — Encounter: Payer: Self-pay | Admitting: *Deleted

## 2015-03-03 NOTE — Op Note (Signed)
PATIENT NAME:  Rebecca Ramos, Rebecca Ramos MR#:  [id number] DATE OF BIRTH:  1961/02/04  DATE OF PROCEDURE:  10/29/2012  PREOPERATIVE DIAGNOSES: 1.  Chronic cystitis.  2.  Recurrent urinary tract infections. 3.  Pseudomembranous trigonitis.  4.  Urethral polyps. 5.  Urethral stenosis.   POSTOPERATIVE DIAGNOSIS:  1.  Chronic cystitis.  2.  Recurrent urinary tract infections. 3.  Pseudomembranous trigonitis.  4.  Urethral polyps. 5.  Urethral stenosis.   OPERATION: 1.  Cystoscopy.  2.  Hydrodilation.  3.  Fulguration of trigone.  4.  Fulguration of urethral polyps.  5.  Urethral dilatation under anesthesia.   SURGEON: Rica KoyanagiJohn Ramos. Talissa Apple, MD  ANESTHESIA:  General.  INDICATIONS: This 54 year old patient has recurrent UTIs which have not responded well to medication. On evaluation, she was found to have urethral stricture which was quite uncomfortable. Adequate dilatation was not possible without further anesthesia or analgesia. She was also noted to have pseudomembranous trigonitis and prominent urethral polyps. There is hyperemia and irritation of the bladder wall reminiscent of IC or allergic cystitis.   DESCRIPTION OF PROCEDURE: In the dorsal lithotomy position under general anesthesia, the lower abdomen and genital area was prepped and draped for endoscopic work. Calibration revealed hang to #22. A 22 cystoscope was introduced and the previously noted areas inspected. There is  hyperemia and injection of the bladder wall initially. Hydrodilation was then done, the bladder accepting 800, 825 and 900 mL, respectively. Glomerulations were prominent following dilatation; however, the number of glomerulations was not rampant. Following hydrodilation, the Tim[place]lake electrode was introduced and the trigone area carefully cauterized. The urethral polyps were then cauterized down to the mucosal surface. Following this, female sounds were used to dilate the urethra to 6[address] [place]. A Foley catheter was placed in the  bladder and left indwelling. The patient tolerated the procedure well. A B and O suppository was placed at the conclusion of the procedure. The patient returned to the recovery area in satisfactory condition.   ____________________________ Rica KoyanagiJohn Ramos. Hilary Milks, MD jsh:ct D: 10/29/2012 14:14:00 ET T: 10/30/2012 12:26:02 ET JOB#: [id number]  cc: Rica KoyanagiJohn Ramos. Tu Bayle, MD, <Dictator> Rica KoyanagiJOHN Ramos Nivea Wojdyla MD ELECTRONICALLY SIGNED 11/01/2012 13:48

## 2015-03-07 NOTE — Consult Note (Signed)
Chief Complaint:  Subjective/Chief Complaint Still with diffuse abdominal pain and distension. However, patient did have few hard stools last night and this AM, which makes SBO unlikely. Had a normal colonoscopy many years ago. Has been on oxycodone for decades. So, the question is what made her suddenly sick? Pt hungry.   VITAL SIGNS/ANCILLARY NOTES: **Vital Signs.:   08-Nov-15 08:00  Vital Signs Type Q 8hr  Temperature Temperature (F) 97.7  Celsius 36.5  Temperature Source oral  Pulse Pulse 57  Respirations Respirations 17  Systolic BP Systolic BP 114  Diastolic BP (mmHg) Diastolic BP (mmHg) 76  Mean BP 88  Pulse Ox % Pulse Ox % 98  Pulse Ox Activity Level  At rest  Oxygen Delivery Room Air/ 21 %   Brief Assessment:  GEN no acute distress   Cardiac Regular   Respiratory clear BS   Gastrointestinal distended with diffuse abdominal tenderness   Lab Results: Routine Hem:  08-Nov-15 05:16   WBC (CBC) 7.6  RBC (CBC) 4.19  Hemoglobin (CBC)  11.8  Hematocrit (CBC) 35.6  Platelet Count (CBC) 285  MCV 85  MCH 28.1  MCHC 33.2  RDW  17.2  Neutrophil % 59.0  Lymphocyte % 30.9  Monocyte % 6.9  Eosinophil % 2.5  Basophil % 0.7  Neutrophil # 4.5  Lymphocyte # 2.4  Monocyte # 0.5  Eosinophil # 0.2  Basophil # 0.1 (Result(s) reported on 21 Sep 2014 at [facility]06:02AM.)   Assessment/Plan:  Assessment/Plan:  Assessment Dilated CBD, Elevated LFT. Likely ileus, since patient having BM's now.   Plan Will start clear liquids. Give tap water enemas. Start miralax by mouth and see if we can start cleaning her out. If patient can be prepped, then perhaps we will start with colonoscopy 1st to evaluate cecum and TI. Can arrange ERCP afterwards. No urgency in ERCP since T.bili remains normal.   Electronic Signatures: Lutricia Feilh, Bonney Berres (MD)  (Signed [phone]-Nov-15 10:04)  Authored: Chief Complaint, VITAL SIGNS/ANCILLARY NOTES, Brief Assessment, Lab Results, Assessment/Plan   Last Updated: 08-Nov-15  10:04 by Lutricia Feilh, Shifa Brisbon (MD)

## 2015-03-07 NOTE — Op Note (Signed)
PATIENT NAME:  Rebecca Ramos, Rebecca Ramos MR#:  [id number] DATE OF BIRTH:  04-04-1961  DATE OF PROCEDURE:  03/11/2014  SURGEON: Maxine GlennBrigitte E. Timothey Dahlstrom, M.D.   PREOPERATIVE DIAGNOSIS: Severe uterine bleeding leading to anemia.   POSTOPERATIVE DIAGNOSIS: Irregular shedding endometrium.   PROCEDURES PERFORMED: Examination under anesthesia. Hysteroscopy, D and C.   ANESTHESIA: General.   COMPLICATIONS: None.   ESTIMATED BLOOD LOSS: 10 mL.   INDICATION FOR SURGERY: [name] is a 54 year old patient who has several months long history of severe dysfunctional uterine bleeding leading to severe anemia. Hormonal treatment was not effective. Initial endometrial biopsy revealed atypical cells and, therefore, decision was made to proceed with hysteroscopy, D and C.   FINDINGS AT TIME OF SURGERY: Normal external genitalia, urethral meatus, urethra, bladder and vagina. Cervix without lesion. Uterus of normal size to slightly enlarged, in midposition, mobile and tender. No adnexal masses or tenderness. Endometrial cavity with irregular shedding endometrium. No specific lesions were seen.   OPERATIVE REPORT: After adequate general anesthesia had been obtained, the patient was prepped and draped in high lithotomy position. Examination was done with the above-mentioned findings. The cervix was visualized and grasped with a single-tooth tenaculum. The uterus was then sounded to about 8 cm. The cervix was dilated.   The hysteroscope was introduced, and inspection was done with the above-mentioned findings. Then, using the sharp curette, a D and C was performed without complication. A moderate amount of tissue was received.   The tenaculum was removed. Adequate hemostasis was noted. Palpation after the procedure was unremarkable.   The patient tolerated the procedure well and was taken to the recovery room in stable condition. Lap, sponge, needle and instrument counts were correct x 2.    ____________________________ Maxine GlennBrigitte E. Haward Pope, MD bem:gb D: 03/18/2014 17:33:30 ET T: 03/19/2014 02:52:01 ET JOB#: [id number]  cc: Maxine GlennBrigitte E. Nhyira Leano, MD, <Dictator> Maxine GlennBRIGITTE E Ramata Strothman MD ELECTRONICALLY SIGNED 03/25/2014 10:31
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
## 2015-03-07 NOTE — Discharge Summary (Signed)
PATIENT NAME:  Rebecca Ramos, Rebecca Ramos MR#:  [id number] DATE OF BIRTH:  Nov 02, 1961  DATE OF ADMISSION:  09/19/2014 DATE OF DISCHARGE:  09/24/2014  ADMITTING PHYSICIAN: Dr. Luberta Mutter.    DISCHARGING PHYSICIAN: Dr. Enid Baas.    PRIMARY CARE PHYSICIAN:  [facility].    CONSULTATIONS IN THE HOSPITAL:  1.  GI consultation with Dr. Bluford Kaufmann.   2.  Surgical consultation by Dr. Anda Kraft.     DISCHARGE DIAGNOSES:  1.  Small bowel obstruction/ileus.  2.  Severe constipation which is chronic.   3.  Pulmonary embolism history.  4.  Elevated LFTs possibly secondary to gallstone ileus.  5.  Hypertension.  6.  Chronic pain syndrome.  7.  Anemia of chronic disease and B12 deficiency.  8.  Hypothyroidism.    MEDICATIONS ON DISCHARGE:   1. Ambien 10 mg p.o. at bedtime.  2. Amlodipine 5 mg p.o. daily.  3. Levothyroxine 25 mcg p.o. daily.   4. Omeprazole 40 mg p.o. daily.  5. Hydroxyzine 50 mg 4 times a day as needed.  6. Xanax 1 mg p.o. 4 times a day as needed for anxiety.  7. Eliquis 5 mg p.o. b.i.d.  8. Ferrous sulfate 325 mg p.o. daily.  9. Promethazine 25 mg q. 6 hours p.r.n.  10. Topamax 50 mg p.o. b.i.d.  11. Fluoxetine 40 mg p.o. daily.  12. Cyanocobalamin 1000 mcg per mL injectable solution 1 mL injected once a month.  13. Oxycodone 10 mg up to 3 times a day as needed for pain.  14. Prednisone taper over 4 days.  15. Colace 100 mg p.o. b.i.d.   16. Dulcolax 5 mg p.o. daily.  17. MiraLax powder daily p.r.n. for constipation.  18. Combivent Respimat 4 times a day as needed for shortness of breath and wheezing.   DISCHARGE DIET: Low-sodium diet.   DISCHARGE ACTIVITY: As tolerated.    FOLLOWUP INSTRUCTIONS:  1.  GI followup with Dr. Bluford Kaufmann in one week.  2.  PCP follow-up in 1-2 weeks.   3.  Liver function test check in 1 week. Advised to follow up with Dr. Bluford Kaufmann or PCP  for this blood work.   LABORATORY AND IMAGING STUDIES PRIOR TO DISCHARGE:  1.  Sodium 141, potassium 3.3,  chloride 106, bicarbonate 26, BUN 8, creatinine 0.85, glucose of 104, and calcium of 8.6.  2.  ALT 73, AST 23, alkaline phosphatase 149, total bilirubin 0.3, and albumin of 3.1.  3.  KUB showing mild distention of small bowel, stomach is nondistended, colonic gas pattern is unremarkable.  4.  WBC 5.4, hemoglobin 10.6, hematocrit 31.9, platelet count 252,000.   5.  On admission LFTs were elevated, alkaline phosphatase was as high as 292, AST 186, and ALT was 275.   BRIEF HOSPITAL COURSE: Miss Leppla is a 54 year old Caucasian female with past medical history significant for history of chronic pain on oxycodone and also history of pulmonary emboli, started on Eliquis a few months ago, comes to the hospital secondary to abdominal pain, nausea, and vomiting. X-ray showed possible ileus versus small bowel obstruction and the patient had elevated LFTs. Her CT of the abdomen also showed common bile duct dilatation with gradual tapering.    1.  Elevated LFTs. Could have been gallstone ileus. The patient is on medications, none of which were changed recently, less likely to be secondary to any medications. She still has her gallbladder and though there are no gallstones it could be gallstone ileus. However it is resolving by itself  and GI and surgery have been following the patient. She will need an outpatient ERCP once her ileus is resolved. She will follow up with Dr. Bluford Kaufmannh as an outpatient.    2.  Chronic severe constipation secondary to chronically being on pain medications. Required enemas in the hospital. Her bowel regimen has been changed to Colace b.i.d., Dulcolax every day, and MiraLax p.r.n. while she takes oxycodone. Initial x-ray showed large amounts of hard stool, after enemas that has resolved and the patient's abdominal pain, nausea, and vomiting improved and she is able to tolerate regular diet without any trouble. She will need a colonoscopy as an outpatient, for which she will follow up with Dr. Bluford Kaufmannh.    3.  Chronic anemia secondary to B12 deficiency and also anemia of chronic disease on iron supplements, B12 supplements, and baseline hemoglobin is around 10. She has been stable while in the hospital.   4.  Hypertension on Norvasc.  5.  Bipolar anxiety. The patient on Topamax and Xanax and fluoxetine, which will be continued without any changes.  6.  Reactive airway disease while in the hospital. She is on Combivent inhaler and improved with steroids, so she is being discharged on prednisone taper.  7.  Her course has been otherwise uneventful in the hospital.   DISCHARGE CONDITION: Stable.   DISCHARGE DISPOSITION: Home.   TIME SPENT ON DISCHARGE: 45 minutes.    ____________________________ Enid Baasadhika Leopoldo Mazzie, MD rk:bu D: 09/24/2014 13:19:52 ET T: 09/24/2014 14:07:08 ET JOB#: [id number]  cc: Enid Baasadhika Brayson Livesey, MD, <Dictator> Ezzard StandingPaul Y. Bluford Kaufmannh, MD [facility] Enid BaasADHIKA Annemarie Sebree MD ELECTRONICALLY SIGNED 10/04/2014 14:53

## 2015-03-07 NOTE — Consult Note (Signed)
Pt seen and examined. Full consult to follow. Pt with intractable nausea, vomiting, and generalized abdominal pain, though more noticeable in RUQ area. LFT elevated except for t.bili. CBD quite dilated. Pt with increasing abdominal distension. NO BM in 2 days. Normally, has BM's daily. Developed PE post hysterectomy earlier this year. CT suggests possible distal SB obstruction though no transition site seen. Abdomen is distended with diffuse abdominal tenderness. Decreased bowel sounds. With her abdominal surgery hx, she could have adhesions. She could potentially have gallstone ileus as well. Agree that patient will eventually need ERCP, which I described in detail with patient. However, eliquis need to be held for few days, heparin stopped for min 6 hours, AND this ileus/SBO resolved 1st. Otherwise, GI symptoms will worsen during ERCP when signif air insufflation has to be performed. Will await surgical input. Will follow. Thanks.  Electronic Signatures: Lutricia Feilh, Adriahna Shearman (MD)  (Signed on 07-Nov-15 09:02)  Authored  Last Updated: 07-Nov-15 09:02 by Lutricia Feilh, Kieran Nachtigal (MD)

## 2015-03-07 NOTE — Op Note (Signed)
PATIENT NAME:  Rebecca Ramos, Rebecca Ramos MR#:  [id number] DATE OF BIRTH:  03/25/1961  DATE OF PROCEDURE:  03/25/2014  PREOPERATIVE DIAGNOSIS: Persistent dysfunctional uterine bleeding not responding to conservative management.   POSTOPERATIVE DIAGNOSES: 1.  Moderate adhesions. 2.  Uterus of normal size with small cervical fibroid.   OPERATIVE REPORT: After adequate general anesthesia had been obtained, the patient was prepped and draped in ski position. The cervix was visualized, grasped with a single-tooth tenaculum and dilated. Holding stitch was placed.  VCare was inserted into the uterus and around the cervix, then a Foley catheter was inserted.  Attention was then directed towards the abdomen. A 1 cm incision was placed above the umbilicus. This was carried down to the fascia which was then incised. The peritoneum was identified and entered bluntly followed by the blunt trocar. Inspection was done with the above-mentioned findings. A robotic trocar was inserted into the left upper abdomen. Then, using the Harmonic scalpel, the adhesions between omentum and abdominal wall were lysed. Then the second robotic trocar was inserted into the right upper abdomen and a fourth one into the left lower quadrant. Finally, a VersaStep assistant port was inserted into the right lower quadrant. The patient was placed in deeper Trendelenburg position and attached to the robot.  The adhesions between large and small bowel and pelvis were lysed. The round ligament on the right side was cauterized and transected. The pelvic sidewall was entered. The fallopian tube was freed from the ovary. Then the utero-ovarian ligament was cauterized and transected. The same procedure was performed on the contralateral side. Then the anterior fold of the peritoneum was incised. The bladder was freed from uterus and upper vagina all the way around the Parks[place]. Dissection was carried around the fibroid on the lower cervix. The posterior peritoneum  was incised. The uterine vessels were identified on either side, cauterized and transected. Finally, the vaginal apex was completely freed and incised all the way around so that uterus and tubes could be removed through the vagina. The vaginal cuff was closed with a figure-of-eight stitch using 0 Vicryl on the left side and a running V-Loc suture starting from the right and ending on the left side.   Adequate hemostasis was confirmed in all areas. The patient was undocked from the robot.  The camera port was closed using 0 Vicryl for the fascia and 2 interrupted sutures. The subcutaneous tissue of all ports was reapproximated using 3-0 Vicryl and the skin was closed with Steri-Strips.  The patient tolerated the procedure well and was taken to recovery room in stable condition. Postoperative urine was clear. Pad, sponge, needle and instrument counts were correct x 2.   ____________________________ Rebecca GlennBrigitte E. Elizabth Palka, MD bem:ce D: 03/25/2014 16:25:26 ET T: 03/25/2014 18:59:50 ET JOB#: [id number]  cc: Rebecca GlennBrigitte E. Sunnie Odden, MD, <Dictator> Rebecca GlennBRIGITTE E Darrelle Wiberg MD ELECTRONICALLY SIGNED 04/01/2014 18:17

## 2015-03-07 NOTE — H&P (Signed)
PATIENT NAME:  Rebecca Ramos, Rebecca Ramos MR#:  [id number] DATE OF BIRTH:  [birth year]  DATE OF ADMISSION:  06/09/2014  PRIMARY CARE PHYSICIAN[facility]    REFERRING ER PHYSICIAN:  Loraine LericheMark R. Quale, MD  CHIEF COMPLAINT: Dizziness and chest pain.   HISTORY OF PRESENT ILLNESS: This is a 54 year old with past history of hypertension, chronic obstructive pulmonary disease, gastroesophageal reflux disease, hyperlipidemia, panic disorder, migraine, anemia and pulmonary embolism [facility]. [facility] and found having pulmonary embolism. They started her on Lovenox and discharged her on Lovenox and Coumadin. She continued taking it for almost 5 days and then she has a followup appointment with her primary care which was on last Thursday, 4 days ago, when PMD checked her INR level which was acceptable and normal so  told her to stop taking her Lovenox and just continue taking Coumadin, which she was doing for 3 days. Today, morning, she suddenly had an episode of feeling dizzy and had some chest pain which was more on the right side, started having panicky and short of breath and so called EMS. When EMS arrived, they told her to relax, gave her some oxygen and gave her nitroglycerin and aspirin tablets and brought her to the Emergency Room, and she felt a little better after that but she started having severe headache after receiving nitroglycerin. ER physician did not do pulmonary embolism workup again as she is already known for that but her INR level was 1.2 and so given to hospitalist team for further management.   REVIEW OF SYSTEMS:    CONSTITUTIONAL: Negative for fever, fatigue, weakness, pain or weight loss.  EYES: No blurring, double vision, discharge or redness.  EARS, NOSE, THROAT: No tinnitus, ear pain or hearing loss.  RESPIRATORY: The patient has shortness of breath. No cough or wheezing.  CARDIOVASCULAR: No chest pain but has pain on  the right side of the chest. No palpitations, edema, arrhythmia.  GASTROINTESTINAL: No nausea, vomiting, diarrhea, abdominal pain.  GENITOURINARY: No dysuria, hematuria or increased frequency.  ENDOCRINE: No heat or cold intolerance. No excessive sweating.  SKIN: No acne, rashes or lesions.  MUSCULOSKELETAL: No pain or swelling in the joints.  NEUROLOGICAL: No numbness, weakness, tremor or vertigo.  PSYCHIATRIC: The patient had panic attacks with this shortness of breath today.   PAST MEDICAL HISTORY: 1.  Hypertension.  2.  Chronic obstructive pulmonary disease.  3.  Gastroesophageal reflux disease.   4.  Hyperlipidemia.  5.  Panic disorder.  6.  Migraine.  7.  Anemia.  8.  Pulmonary embolism.   SOCIAL HISTORY: She denies any smoking. No alcohol. No illicit drug use. She is disabled because of fracture and surgeries in the back.   PAST SURGICAL HISTORY: Multiple joint surgeries and back surgery and had total hysterectomy 2 months ago because of bleeding.   FAMILY HISTORY: Negative for pulmonary embolus.   HOME MEDICATIONS:  1.  Symbicort 1 puff once a day.  2.  Promethazine 25 mg oral every 6 hours.  3.   ProAir 2 puffs inhalation 4 times a day.  4.  Omeprazole 40 mg oral once a day.  5.   Lovaza 1000 mg oral tablet once a day.  6.  Levothyroxine 25 mcg oral once a day.  7.  Hydroxyzine 1 tablet 4 times a day.  8.  Ferrous sulfate 324 mg oral 4 times a day.  9.  EpiPen  as needed.  10.  Diazepam 5 mg oral tablet once a day.  11.  Coumadin 7.5 mg oral once a day.  12.  Amlodipine 5 mg oral once a day.  13.  Ambien 10 mg oral once a day.  14.  Alprazolam 1 mg oral 4 times a day.  15.  Albuterol 3 mL inhalation 3 times a day.   PHYSICAL EXAMINATION: VITAL SIGNS: In ER, temperature 97.9, pulse 58, respirations 22, blood pressure 147/87, pulse oximetry 100% on room air.  GENERAL: The patient is fully alert and oriented.  HEENT: Head and neck atraumatic. Conjunctivae pink. Oral  mucosa moist.  NECK: Supple. No JVD.  RESPIRATORY: Bilateral equal and clear air entry.  CARDIOVASCULAR: S1, S2 present, regular. No murmur.  ABDOMEN: Soft, nontender. Bowel sounds present. No organomegaly.  SKIN: No rashes.  LEGS: No edema.  NEUROLOGICAL: Power 5 out of 5. Follows commands.  Move all 4 limbs. No numbness, tremor or rigidity.  PSYCHIATRIC: Does not appear in any acute psychiatric illness.   LABORATORY, DIAGNOSTIC AND RADIOLOGICAL DATA:   1.  Chest x-ray, portable, single view no acute cardiopulmonary abnormality.  2.  BNP is 425. BUN 8, creatinine 0.77, sodium 141, potassium is 3.9, chloride is 107, CO2 of 24, anion gap 10, calcium 8.6.  3.  Troponin less than 0.02.  4.  WBC 10.5, hemoglobin 10.7, platelet count 377, MCV 79.  5.  INR is 1.2, prothrombin time 15.2.  ASSESSMENT AND PLAN: A 54 year old female recently diagnosed with pulmonary embolism, came to hospital because of panic episode with dizziness and chest pain. INR is subtherapeutic.  1.  Pulmonary embolism with subtherapeutic INR on Coumadin. I counseled the patient and she finally, agreed after a long discussion, to switch from Coumadin to Eliquis and so I will start her on that now. Meanwhile, as she has complaint of right-sided chest pain and some dizziness, we will admit her on observation with telemetry and continue monitoring. We will also get echocardiogram to check strain on the heart.  2.  Complaint of chronic obstructive pulmonary disease. We will continue albuterol and Symbicort.  3.  Hypertension. Continue amlodipine.  4.  Anxiety and panic disorder. Continue alprazolam.  5.  Anemia. Continue ferrous sulfate and she is [facility] with [facility] for this issue.   TOTAL TIME SPENT ON THIS ADMISSION: 50 minutes.   ____________________________ Hope Pigeon Elisabeth Pigeon, MD vgv:cs D: 06/09/2014 17:08:00 ET T: 06/09/2014 19:07:48 ET JOB#: [id number]  cc: Hope Pigeon. Elisabeth Pigeon, MD, <Dictator> Knute Neu. Lorre Nick, MD Altamese Dilling MD ELECTRONICALLY SIGNED 06/10/2014 16:16

## 2015-03-07 NOTE — Discharge Summary (Signed)
PATIENT NAME:  Rebecca Ramos, Kerline S MR#:  [id number] DATE OF BIRTH:  Mar 18, 1961  DATE OF ADMISSION:  06/09/2014 DATE OF DISCHARGE:  06/10/2014  PRESENTING COMPLAINT: Dizziness along with chest pain.   DISCHARGE DIAGNOSES:  1.  Known history of pulmonary embolism.  2.  Dizziness, resolved.   Oxygen saturation on discharge was 98% on room air.   CODE STATUS: Full.   DISCHARGE MEDICATIONS:  1.  Ambien 10 mg p.o. at bedtime.  2.  Amlodipine 5 mg daily.  3.  Lovaza 1000 mg p.o. daily.  4.  Diazepam 5 mg at bedtime.  5.  Fluticasone 1 spray twice a day as needed.  6.  ProAir 2 puffs 4 times a day as needed.  7.  Symbicort 160/4.5 1 puff daily.  8.  Levothyroxine 25 mcg daily.  9.  Ferrous sulfate 324 mg p.o. 4 times a day.  10. Alprazolam 1 mg 4 times a day as needed.  11. Omeprazole 40 mg daily.  12. Hydroxyzine/hydrochloride 50 mg 1 tablet 4 times a day.  13. EpiPen as needed.  14. Promethazine 25 mg 1 tablet every 6 hours as needed.  15. Albuterol nebulizer 3 mL 3 times a day.  16. MiraLax once a day as needed.  17. Eliquis 5 mg 2 tablets b.i.d. for 7 days, then 1 tablet b.i.d.  18. Acetaminophen/oxycodone 325/5 mg 1 tablet every 8 hours as needed.   FOLLOWUP: [facility] appointment on this coming Friday.   LABORATORY DATA AND DIAGNOSTIC STUDIES:  Echo Doppler essentially was normal. No right ventricular strain.   H and H 10.4 and 32.5, platelet count was 325,000. Cardiac enzymes x 3 negative.   HOSPITAL COURSE:  Rebecca Ramos is a 54 year old female with recently-diagnosed pulmonary embolism came to the hospital with panic episode along with dizziness and chest pain. Her INR was subtherapeutic. She was admitted with:  1.  Known new diagnosis of right-sided pulmonary embolism with subtherapeutic INR on Coumadin. The patient was counseled and she agreed after a long discussion to switch from Coumadin to Eliquis. She was given a 30 day free trial card.  [facility]. Her oxygen saturation is 98% on room air. She was explained the importance of taking her Eliquis as recommended in order to avoid any more complications from pulmonary embolism.  2.  Chronic obstructive pulmonary disease, on albuterol and Symbicort.  3.  Hypertension. Continue amlodipine.  4.  Anxiety and panic disorder. Continue alprazolam and diazepam.  5.  Anemia. Continue ferrous sulfate. [facility].    Hospital stay otherwise remained stable. She remained a full code.   TIME SPENT: 40 minutes.     ____________________________ Wylie HailSona A. Allena KatzPatel, MD sap:lt D: 06/10/2014 14:48:42 ET T: 06/10/2014 15:14:36 ET JOB#: [id number]  cc: Chaniyah Jahr A. Allena KatzPatel, MD, <Dictator> [facility] [place] Willow OraSONA A Yasseen Salls MD ELECTRONICALLY SIGNED 06/25/2014 11:34
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
## 2015-03-07 NOTE — Consult Note (Signed)
Chief Complaint:  Subjective/Chief Complaint Feeling better. Less abdominal pain. More BM's. Wanted diet to be advanced. WOuld like to go home soon.   VITAL SIGNS/ANCILLARY NOTES: **Vital Signs.:   10-Nov-15 08:37  Vital Signs Type Q 8hr  Temperature Temperature (F) 97.3  Celsius 36.2  Temperature Source oral  Pulse Pulse 60  Respirations Respirations 18  Systolic BP Systolic BP 253  Diastolic BP (mmHg) Diastolic BP (mmHg) 72  Mean BP 87  Pulse Ox % Pulse Ox % 97  Pulse Ox Activity Level  At rest  Oxygen Delivery Room Air/ 21 %   Brief Assessment:  GEN no acute distress   Cardiac Regular   Respiratory clear BS   Gastrointestinal mild diffuse tenderness   Lab Results: Routine Chem:  10-Nov-15 04:51   Creatinine (comp) 0.79  eGFR (African American) >60  eGFR (Non-African American) >60 (eGFR values <28m/min/1.73 m2 may be an indication of chronic kidney disease (CKD). Calculated eGFR, using the MRDR Study equation, is useful in  patients with stable renal function. The eGFR calculation will not be reliable in acutely ill patients when serum creatinine is changing rapidly. It is not useful in patients on dialysis. The eGFR calculation may not be applicable to patients at the low and high extremes of body sizes, pregnant women, and vegetarians.)  Routine Hem:  10-Nov-15 04:51   WBC (CBC) 5.5  RBC (CBC)  3.77  Hemoglobin (CBC)  10.6  Hematocrit (CBC)  31.9  Platelet Count (CBC) 252  MCV 85  MCH 28.1  MCHC 33.2  RDW  17.0  Neutrophil % 40.0  Lymphocyte % 50.0  Monocyte % 6.6  Eosinophil % 2.5  Basophil % 0.9  Neutrophil # 2.2  Lymphocyte # 2.8  Monocyte # 0.4  Eosinophil # 0.1  Basophil # 0.1 (Result(s) reported on 23 Sep 2014 at 05:29AM.)   Radiology Results: XRay:    10-Nov-15 07:01, KUB - Kidney Ureter Bladder  KUB - Kidney Ureter Bladder   REASON FOR EXAM:    f/u constipation, bowel obstruction  COMMENTS:       PROCEDURE: DXR - DXR KIDNEY URETER  BLADDER  - Sep 23 2014  7:01AM     CLINICAL DATA:  Constipation.  Bowel obstruction.  Abdominal pain.    EXAM:  ABDOMEN - 1 VIEW    COMPARISON:  09/21/2014.  CT 09/19/2014.    FINDINGS:  Soft tissue structures are unremarkable. Calcified pelvic  phleboliths. Persistent mild distention of small bowel. Colonic gas  pattern is normal. No free air. Degenerative changes thoracolumbar  spine.     IMPRESSION:  Persistent mild distention of small bowel. Stomach is nondistended.  Colonic gas pattern is unremarkable. No free air.      Electronically Signed    By: TMarcello Moores Register    On: 09/23/2014 07:38         Verified By: TOsa Craver M.D., MD   Assessment/Plan:  Assessment/Plan:  Assessment Dilated CBD, elevated LDFT, ileus/constipation.   Plan Ok for discharge by tomorrow if tolerates solids. Have patient f/u in office in 1-2 weeks. Discharge on either miralax bid or miralax qd plus colace bid. If patient needs to go back on eliquis on discharge, will set up colonscopy and ERCP on back to back days off xarelto x min 48hrs. I will be out tomorrow. Thanks.   Electronic Signatures: OVerdie Shire(MD)  (Signed [phone])  Authored: Chief Complaint, VITAL SIGNS/ANCILLARY NOTES, Brief Assessment, Lab Results, Radiology Results, Assessment/Plan   Last  Updated: 10-Nov-15 12:45 by Verdie Shire (MD)

## 2015-03-07 NOTE — Consult Note (Signed)
Chief Complaint:  Subjective/Chief Complaint Some more BM's with tap water enemas and miralax. Less abdominal pain. Abdomen still distended.   VITAL SIGNS/ANCILLARY NOTES: **Vital Signs.:   09-Nov-15 08:14  Vital Signs Type Q 8hr  Temperature Temperature (F) 97.3  Celsius 36.2  Temperature Source oral  Pulse Pulse 64  Respirations Respirations 18  Systolic BP Systolic BP 116  Diastolic BP (mmHg) Diastolic BP (mmHg) 73  Mean BP 87  Pulse Ox % Pulse Ox % 97  Pulse Ox Activity Level  At rest  Oxygen Delivery Room Air/ 21 %   Brief Assessment:  GEN no acute distress   Cardiac Regular   Respiratory clear BS   Gastrointestinal distended with diffuse tenderness   Lab Results: Routine Hem:  08-Nov-15 05:16   WBC (CBC) 7.6  RBC (CBC) 4.19  Hemoglobin (CBC)  11.8  Hematocrit (CBC) 35.6  Platelet Count (CBC) 285  MCV 85  MCH 28.1  MCHC 33.2  RDW  17.2  Neutrophil % 59.0  Lymphocyte % 30.9  Monocyte % 6.9  Eosinophil % 2.5  Basophil % 0.7  Neutrophil # 4.5  Lymphocyte # 2.4  Monocyte # 0.5  Eosinophil # 0.2  Basophil # 0.1 (Result(s) reported on 21 Sep 2014 at [facility]:02AM.)   Radiology Results: XRay:    08-Nov-15 11:01, Abdomen Flat and Erect  Abdomen Flat and Erect   REASON FOR EXAM:    Ileus  COMMENTS:       PROCEDURE: DXR - DXR ABDOMEN 2 V FLAT AND ERECT  - Sep 21 2014 11:01AM     CLINICAL DATA:  Epigastric abdominal pain    EXAM:  ABDOMEN - 2 VIEW    COMPARISON:  09/19/2014 CT    FINDINGS:  Moderate stool burden throughout nondilated colon. No dilated  gas-filled loop of colon is identified. Left mid abdominal loops of  small bowel measure upper limits of normal in size, 2.8 cm maximally  over the left mid abdomen. No differential air-fluid level. No  abnormal radiopacity.     IMPRESSION:  Upper limits of normal nonspecific 1-2 loops of small bowel but no  overall pattern suggesting obstruction otherwise.      Electronically Signed    By: Christiana PellantGretchen   Green M.D.    On: 09/21/2014 11:10         Verified By: Harrel LemonGRETCHEN E. GREEN, M.D.,   Assessment/Plan:  Assessment/Plan:  Assessment Severe constipation. Ileus. Dilated CBD.   Plan Add colace bid and dulcolax daily to daily regimen. If she can tolerate, then will order bowel prep later.   Electronic Signatures: Lutricia Feilh, Amere Iott (MD)  (Signed [phone]-Nov-15 13:04)  Authored: Chief Complaint, VITAL SIGNS/ANCILLARY NOTES, Brief Assessment, Lab Results, Radiology Results, Assessment/Plan   Last Updated: 09-Nov-15 13:04 by Lutricia Feilh, Daisee Centner (MD)

## 2015-03-09 ENCOUNTER — Inpatient Hospital Stay: Payer: Medicare Other | Admitting: Internal Medicine

## 2015-03-15 NOTE — H&P (Signed)
PATIENT NAME:  Rebecca Ramos, Rebecca Ramos MR#:  [id number] DATE OF BIRTH:  [birth year]  DATE OF ADMISSION:  01/09/2015  REFERRING PHYSICIAN: Enedina Finnerandolph N. Manson PasseyBrown, MD  PRIMARY CARE PHYSICIAN: Vickie F. Reed BreechIngledue, MD, at [facility].   ADMISSION DIAGNOSIS: Acute on chronic respiratory failure with hypoxemia.   HISTORY OF PRESENT ILLNESS: This is a 54 year old Caucasian female who presents to the Emergency Department complaining of worsening cough for 2 days. The patient states that she has had subjective fevers at home and a cough productive of thick white phlegm. She complains of some pain between her ribs when she coughs and she has had a few episodes of posttussive emesis. Both episodes were nonbloody and nonbilious. The patient states that she has been feeling more short of breath, particularly after paroxysm of cough. In the Emergency Department, the patient was found to have significant wheezing and did not improve significantly after 3 breathing treatments, which prompted them to call for admission.   REVIEW OF SYSTEMS:  CONSTITUTIONAL: The patient admits to subjective fever and generalized malaise.  EYES: Denies blurred vision or inflammation.  EARS, NOSE AND THROAT: Denies tinnitus or sore throat.  RESPIRATORY: Admits to cough and shortness of breath.  CARDIOVASCULAR: Admits to chest wall pain but denies orthopnea, palpitations, or paroxysmal nocturnal dyspnea.  GASTROINTESTINAL: Admits to nausea and vomiting but denies abdominal pain or diarrhea.  GENITOURINARY: Denies dysuria, increased frequency, or hesitancy of urination.  ENDOCRINE: Denies polyuria or polydipsia.  HEMATOLOGIC AND LYMPHATIC: Denies easy bruising or bleeding.  INTEGUMENTARY: Denies rashes or lesions.  MUSCULOSKELETAL: Denies arthralgias or myalgias.  NEUROLOGIC: Denies numbness in her extremities or dysarthria.  PSYCHIATRIC: Denies depression or suicidal ideation.   PAST MEDICAL HISTORY: COPD, hypertension, and  hypothyroidism.   PAST SURGICAL HISTORY: Hysterectomy, cesarean section, tonsillectomy and adenoidectomy, and bilateral tubal ligation.   SOCIAL HISTORY: The patient does not smoke, drink, or do any drugs. She admits that she has been exposed a significant amount of secondhand smoke throughout most of her life.   FAMILY HISTORY: Her mother is deceased of a heart disease.   MEDICATIONS:  1.  Albuterol with ipratropium inhaler 100 mcg/20 mcg/inhalation 1 puff inhaled 4 times a day as needed for shortness of breath.  2.  Alprazolam 2 mg 1/2 tablet p.o. 4 times a day as needed for anxiety.  3.  Ambien 10 mg 1 tablet p.o. at bedtime.  4.  Amlodipine 5 mg 1 tablet p.o. daily.  5.  Bentyl 20 mg 1 tablet p.o. 4 times a day as needed for abdominal cramping.  6.  B12 injections 1 injection per month.  7.  Ferrous sulfate 324 mg delayed-release tablet 1 tablet p.o. daily.  8.  Fluoxetine 40 mg 1 capsule p.o. daily.  9.  Hydroxyzine 50 mg 1 tablet p.o. 4 times a day as needed.  10.  Levothyroxine 25 mcg 1 tablet p.o. daily.  11.  Omeprazole 40 mg 1 tablet p.o. daily.  12.  Oxycodone 10 mg 1 tablet p.o. t.i.d. as needed for pain.  13.  Polyethylene glycol 17 grams p.o. daily as needed for constipation.  14.  Promethazine 25 mg 1 tablet p.o. every 6 hours as needed.  15.  Topiramate 50 mg 1 tablet p.o. b.i.d.   ALLERGIES: CODEINE, LEVAQUIN, SEPTRA, AND SULFA DRUGS.   PERTINENT LABORATORY RESULTS AND RADIOGRAPHIC FINDINGS: Serum glucose is 88, BUN is 24, creatinine 1.73, serum sodium 133, potassium is 3.4, chloride is 99, bicarbonate is 23, calcium is 8.2. Serum  albumin is 3.6, alkaline phosphatase 68, AST is 31, ALT is 22. Troponin is negative. White blood cell count is 7.1, hemoglobin is 11.2, hematocrit 34.1, platelet count 222,000, MCV is 85. Arterial blood gas shows a pH of 7.41, pCO2 of 36, pO2 of 57 on 21% FiO2 with a base excess of -1. Chest x-ray shows no active cardiopulmonary disease.    PHYSICAL EXAMINATION:  VITAL SIGNS: Temperature is 98.2, pulse 69 respirations 18, blood pressure 89/58, pulse oximetry is 95% on room air.  GENERAL: The patient is alert, oriented x 3 in no apparent distress.  HEENT: Normocephalic, atraumatic. Pupils equal, round, and reactive to light and accommodation. Extraocular movements are intact. Mucous membranes are moist.  NECK: Trachea is midline. No adenopathy. Thyroid is nonpalpable and nontender.  CHEST: Symmetric and atraumatic.  CARDIOVASCULAR: Regular rate and rhythm. Normal S1, S2. No rubs, clicks, or murmurs appreciated.  LUNGS: There are wheezes in bilaterally with rhonchi that clear with cough. There is moderate air movement.  ABDOMEN: Positive bowel sounds. Soft, nontender, nondistended. No hepatosplenomegaly.  GENITOURINARY: Deferred.  MUSCULOSKELETAL: The patient moves all 4 extremities equally. I have not tested the patient'Ramos gait.  SKIN: No rashes or lesions. It is warm and dry.  EXTREMITIES: No clubbing, cyanosis, but there is trace edema of lower extremities bilaterally.  NEUROLOGIC: Cranial nerves II-XII are grossly intact.  PSYCHIATRIC: Mood is normal. Affect is congruent. The patient has fair judgment and insight into her medical condition.   ASSESSMENT AND PLAN: This is a 54 year old female admitted for acute on chronic respiratory failure with hypoxemia.  1.  Acute on chronic respiratory failure: The patient is only mildly hypoxemic, which is actually within acceptable range for chronic obstructive pulmonary disease. Her cough and wheezing are consistent with chronic obstructive pulmonary disease exacerbation. I have started the patient on a prednisone taper as well as azithromycin.  2.  Chronic obstructive pulmonary disease: We will do albuterol breathing treatments as needed. I will continue her inhaled corticosteroid and add Spiriva to her inhaler regimen.  3.  Acute kidney injury: I will gently hydrate the patient and  replace her potassium. We will avoid nephrotoxic agents.  4.  Hypothyroidism: Continue Synthroid.  5.  Hypertension: Continue amlodipine.  6.  Deep vein thrombosis prophylaxis: Subcutaneous heparin.  7.  Gastrointestinal prophylaxis: Ranitidine as needed.   CODE STATUS: The patient is a full code.   TIME SPENT ON ADMISSION ORDERS AND PATIENT CARE: Approximately 35 minutes.     ____________________________ Kelton Pillar. Sheryle Hail, MD msd:bm D: 01/09/2015 02:39:11 ET T: 01/09/2015 03:22:17 ET JOB#: [id number]  cc: Kelton Pillar. Sheryle Hail, MD, <Dictator> Kelton Pillar Jacques Fife MD ELECTRONICALLY SIGNED 01/10/2015 19:23

## 2015-03-15 NOTE — Discharge Summary (Signed)
PATIENT NAME:  Rebecca Ramos, Titania S MR#:  [id number] DATE OF BIRTH:  07/03/1961  DATE OF ADMISSION:  01/09/2015 DATE OF DISCHARGE:  01/11/2015  DISCHARGE DIAGNOSES:  1.  Acute on chronic respiratory failure.  2.  Community-acquired pneumonia.   CONSULTATIONS: Pulmonology, Dr. Dema SeverinMungal.    BRIEF HISTORY AND HOSPITAL COURSE BASED ON THE PROBLEM: Please review history and physical for details. Acute on chronic respiratory failure secondary to community-acquired pneumonia, probably underlying asthmatic COPD. The patient is a 54 year old Caucasian female who came into the ED with a chief complaint of worsening of cough for 2 days. Also, she had subjective fevers at home. The patient was given 3 breathing treatments in the ED. She was still wheezing. The patient was admitted to the hospitalist team.  1.  Acute on chronic respiratory failure. The patient was mildly hypoxemic. She was placed on oxygen. It was thought to be from COPD exacerbation. The patient was placed on steroids and nebulizer treatments. She was also seen by pulmonology, Dr. Dema SeverinMungal. The patient had a CT chest. Pulmonary embolism was ruled out, but it has revealed right upper lobe pneumonia. Rocephin and Zithromax were given for pneumonia and her steroids were tapered. The patient's Symbicort was stopped and she was started on Pulmicort nebulizer treatments during the hospital course. Also, DuoNeb treatments were given. CAT scan of the chest has revealed small nodule and lymphadenopathy, which can be acute phase from underlying inflammation and they have recommended to repeat the CT chest in 3 months. The patient seemed significantly improved and decision is made to discharge her home with Augmentin 500 mg p.o. b.i.d. for 7 days and azithromycin for 5 more days. Steroids were switched to p.o. prednisone; a tapering course of steroids was given for approximately 10 days at the time of discharge. Though sputum culture was ordered, the patient was unable to  bring up any phlegm, so hypertonic saline induced sputum culture was ordered to prior to discharge by respiratory therapist. At the time of discharge, home inhaler, Symbicort and albuterol, were started. The patient was recommended to follow up with [facility] in 1 month after discharge.  2.  Asthmatic COPD versus sarcoidosis versus underlying lymphoma per CAT scan of the chest. The plan is to follow up with pulmonology as an outpatient and to repeat CAT scan of the chest in 2-3 months at pulmonology discretion.  3.  Acute kidney injury. IV fluids were given. Her potassium was replaced. Nephrotoxic agents were avoided and acute kidney injury as was resolved. Overall, her condition was stable at the time of discharge.   PHYSICAL EXAMINATION:  VITAL SIGNS: Temperature 97.6, pulse 60, respirations 18, blood pressure of 109/70, pulse oximetry 90% on room air with exertion.  GENERAL: The patient is not in any acute distress. Moderately built and nourished.  HEENT: Normocephalic, atraumatic. Pupils are equally reactive to light and accommodation. No scleral icterus. No conjunctivitis. No sinus tenderness. No postnasal drip. Moist mucous membranes.  NECK: Supple. No JVD. No thyromegaly. Range of motion is intact.  LUNGS: Positive crackles. No rhonchi.  CARDIAC: S1, S2 normal. Regular rate and rhythm. No murmurs.  GASTROINTESTINAL: Soft. Bowel sounds are positive in all 4 quadrants. Nontender, nondistended. No masses felt.  NEUROLOGIC: Awake, alert, and oriented x 3. Cranial nerves II-XII are grossly intact. Motor and sensory are intact. Reflexes are 2+.  EXTREMITIES: No edema. No cyanosis. No clubbing.  SKIN: Warm to touch. Normal turgor. No rashes. No lesions.  MUSCULOSKELETAL: No joint effusion, tenderness, or edema.  PSYCHIATRIC: Normal mood and affect.   LABORATORY AND IMAGING STUDIES: Creatinine at the time of admission on February 26 was 1.73, on February 27 it was back to normal. Magnesium  level was normal. LFTs and troponin were normal. WBC 7.1, hemoglobin and hematocrit are 11.2 and 34.1, platelet count is normal. CAT scan of the chest with CT angiogram has revealed no evidence of PE, mediastinal and left hilar adenopathy are associated with central lobular nodules in the superior segment of the left lower lobe with tree-in-bud opacities. Findings suggest inflammatory process such as mycobacterium infection. Malignancy such as lymphoma and sarcoidosis are not excluded, consider a fungal disorder.   MEDICATIONS AT THE TIME OF DISCHARGE: Levothyroxine 25 mcg p.o. once daily; hydroxyzine/hydrochloride 50 mg 1 tablet p.o. 4 times a day as needed; alprazolam 2 mg 1/2 tablet p.o. 4 times a day as needed and 1/2 tablet at bedtime as needed for anxiety; apixaban 5 mg p.o. 2 times a day; iron sulfate 325 mg 1 tablet p.o. once daily; promethazine 25 mg p.o. q. 6 hours as needed; cyanocobalamin 1000 mcg/1 mL at 1 mL injection once a month; oxycodone 10 mg p.o. 3 times a day as needed; Colace 100 mg p.o. 2 times a day; polyethylene glycol 17 grams p.o. once daily as needed for constipation; Bentyl 20 mg 1 tablet p.o. 3 times a day with meals as needed for abdominal spasm; fluoxetine 20 mg capsule 3 capsules p.o. once daily; omeprazole 40 mg 1 capsule p.o. 2 times a day; albuterol with ipratropium 1 puff inhalation 2 times a day as needed for shortness of breath or wheezing; prednisone tapering 50 mg p.o. once a day for 2 days, then 40 mg p.o. once a day for 2 days, 30 mg p.o. once a day for 2 days, then 20 mg p.o. once a day for 2 days, then 10 mg p.o. once a day for 2 days, and stop; topiramate 50 mg 1 tablet p.o. 2 times a day; azithromycin 250 mg 1 tablet p.o. once daily; codeine with guaifenesin 10 mL p.o. every 8 hours as needed for cough; care management was consulted and the coupon was provided for medication assistance; budesonide formoterol 2 puff inhalation 2 times a day; Augmentin 500/125 one  tablet p.o. q. 8 hours; Florastor 1 capsule p.o. 2 times a day; Proventil 2 puff inhalation 4 times a day as needed for wheezing.   DIET: Low-sodium, regular consistency.   ACTIVITY: As tolerated.   FOLLOWUP: With primary care primary care physician in a week and [place] pulmonology in 1 month. Repeat CAT scan of the chest in 3 months.   The plan of care was discussed in detail with the patient. All questions were answered. She verbalized understanding of the plan.   TOTAL TIME SPENT ON DISCHARGE: 45 minutes.    ____________________________ Ramonita Lab, MD ag:bm D: 01/15/2015 16:35:10 ET T: 01/16/2015 00:29:47 ET JOB#: [id number]  cc: Ramonita Lab, MD, <Dictator> Stephanie Acre, MD Primary Care Physician  Ramonita Lab MD ELECTRONICALLY SIGNED 01/27/2015 20:42

## 2015-03-18 ENCOUNTER — Emergency Department
Admission: EM | Admit: 2015-03-18 | Discharge: 2015-03-18 | Disposition: A | Discharge status: Home / Self Care | Payer: Medicare Other | Source: Home / Self Care | Attending: Emergency Medicine | Admitting: Emergency Medicine

## 2015-03-18 ENCOUNTER — Encounter: Payer: Self-pay | Admitting: Emergency Medicine

## 2015-03-18 DIAGNOSIS — Z79899 Other long term (current) drug therapy: Secondary | ICD-10-CM

## 2015-03-18 DIAGNOSIS — Z7952 Long term (current) use of systemic steroids: Secondary | ICD-10-CM

## 2015-03-18 DIAGNOSIS — L03319 Cellulitis of trunk, unspecified: Secondary | ICD-10-CM | POA: Diagnosis present

## 2015-03-18 DIAGNOSIS — K219 Gastro-esophageal reflux disease without esophagitis: Secondary | ICD-10-CM | POA: Diagnosis present

## 2015-03-18 DIAGNOSIS — Z87891 Personal history of nicotine dependence: Secondary | ICD-10-CM

## 2015-03-18 DIAGNOSIS — L03115 Cellulitis of right lower limb: Principal | ICD-10-CM | POA: Diagnosis present

## 2015-03-18 DIAGNOSIS — I10 Essential (primary) hypertension: Secondary | ICD-10-CM

## 2015-03-18 DIAGNOSIS — N764 Abscess of vulva: Secondary | ICD-10-CM

## 2015-03-18 DIAGNOSIS — E039 Hypothyroidism, unspecified: Secondary | ICD-10-CM | POA: Diagnosis present

## 2015-03-18 DIAGNOSIS — Z8249 Family history of ischemic heart disease and other diseases of the circulatory system: Secondary | ICD-10-CM

## 2015-03-18 DIAGNOSIS — F419 Anxiety disorder, unspecified: Secondary | ICD-10-CM | POA: Diagnosis present

## 2015-03-18 DIAGNOSIS — J449 Chronic obstructive pulmonary disease, unspecified: Secondary | ICD-10-CM | POA: Diagnosis present

## 2015-03-18 HISTORY — DX: Gastro-esophageal reflux disease without esophagitis: K21.9

## 2015-03-18 HISTORY — DX: Essential (primary) hypertension: I10

## 2015-03-18 MED ORDER — TRAMADOL HCL 50 MG PO TABS: 50.0000 mg | ORAL_TABLET | Freq: Four times a day (QID) | ORAL | Status: DC | PRN

## 2015-03-18 MED ORDER — DOXYCYCLINE HYCLATE 100 MG PO TABS: 100.0000 mg | ORAL_TABLET | Freq: Two times a day (BID) | ORAL | Status: DC

## 2015-03-18 NOTE — Discharge Instructions (Signed)
Abscess An abscess is an infected area that contains a collection of pus and debris.It can occur in almost any part of the body. An abscess is also known as a furuncle or boil. CAUSES  An abscess occurs when tissue gets infected. This can occur from blockage of oil or sweat glands, infection of hair follicles, or a minor injury to the skin. As the body tries to fight the infection, pus collects in the area and creates pressure under the skin. This pressure causes pain. People with weakened immune systems have difficulty fighting infections and get certain abscesses more often.  SYMPTOMS Usually an abscess develops on the skin and becomes a painful mass that is red, warm, and tender. If the abscess forms under the skin, you may feel a moveable soft area under the skin. Some abscesses break open (rupture) on their own, but most will continue to get worse without care. The infection can spread deeper into the body and eventually into the bloodstream, causing you to feel ill.  DIAGNOSIS  Your caregiver will take your medical history and perform a physical exam. A sample of fluid may also be taken from the abscess to determine what is causing your infection. TREATMENT  Your caregiver may prescribe antibiotic medicines to fight the infection. However, taking antibiotics alone usually does not cure an abscess. Your caregiver may need to make a small cut (incision) in the abscess to drain the pus. In some cases, gauze is packed into the abscess to reduce pain and to continue draining the area. HOME CARE INSTRUCTIONS   Only take over-the-counter or prescription medicines for pain, discomfort, or fever as directed by your caregiver.  If you were prescribed antibiotics, take them as directed. Finish them even if you start to feel better.  If gauze is used, follow your caregiver's directions for changing the gauze.  To avoid spreading the infection:  Keep your draining abscess covered with a  bandage.  Wash your hands well.  Do not share personal care items, towels, or whirlpools with others.  Avoid skin contact with others.  Keep your skin and clothes clean around the abscess.  Keep all follow-up appointments as directed by your caregiver. SEEK MEDICAL CARE IF:   You have increased pain, swelling, redness, fluid drainage, or bleeding.  You have muscle aches, chills, or a general ill feeling.  You have a fever. MAKE SURE YOU:   Understand these instructions.  Will watch your condition.  Will get help right away if you are not doing well or get worse. Document Released: 08/10/2005 Document Revised: 05/01/2012 Document Reviewed: 01/13/2012 [facility] Patient Information 2015 [place]. This information is not intended to replace advice given to you by your health care provider. Make sure you discuss any questions you have with your health care provider.  Your abscess was not surgically treated today.  Continue to apply warm compresses and take the prescription meds as directed.  Return to the ED or your provider in 3-5 days for wound check and probable incision & drainage.

## 2015-03-18 NOTE — ED Notes (Signed)
Boils, rash x2 weeks , ares will clear up and leave a scar, pt feels she may have MRSA

## 2015-03-18 NOTE — ED Provider Notes (Signed)
[facility] Emergency Department Provider Note  ____________________________________________  Time seen: [id number]  I have reviewed the triage vital signs and the nursing notes.  HISTORY  Chief Complaint Recurrent Skin Infections  HPI Rebecca Ramos is a 54 y.o. female with a two-week complaining of a left vulvar tender area. She has been applying Neosporin without improvement. She gives a history of previous skin infections including MRSA. She notes that this firm lesion on the left labia has an stable and unchanged in 2 weeks. She denies spontaneous rupture, drainage, or open lesion. She denies fever, chills, or sweats.  Past Medical History  Diagnosis Date  . Hypertension   . Acid reflux     There are no active problems to display for this patient.   Past Surgical History  Procedure Laterality Date  . Abdominal hysterectomy    . Cesarean section Bilateral   . Tonsillectomy    . Cesarean section Bilateral     Current Outpatient Rx  Name  Route  Sig  Dispense  Refill  . alprazolam (XANAX) 2 MG tablet   Oral   Take 2 mg by mouth at bedtime as needed for sleep.         Marland Kitchen. amLODipine (NORVASC) 10 MG tablet   Oral   Take 10 mg by mouth daily.         . cyanocobalamin 1000 MCG tablet   Intramuscular   Inject 1,000 mcg into the muscle every 30 (thirty) days.         Marland Kitchen. esomeprazole (NEXIUM) 40 MG capsule   Oral   Take 40 mg by mouth daily at 12 noon.         . fluticasone (FLONASE) 50 MCG/ACT nasal spray   Each Nare   Place into both nostrils daily.         Marland Kitchen. lisinopril (PRINIVIL,ZESTRIL) 10 MG tablet   Oral   Take 10 mg by mouth daily.         Marland Kitchen. zolpidem (AMBIEN) 10 MG tablet   Oral   Take 10 mg by mouth at bedtime as needed for sleep.         Marland Kitchen. doxycycline (VIBRA-TABS) 100 MG tablet   Oral   Take 1 tablet (100 mg total) by mouth 2 (two) times daily.   20 tablet   0   . traMADol (ULTRAM) 50 MG tablet   Oral   Take 1 tablet  (50 mg total) by mouth every 6 (six) hours as needed.   10 tablet   0     Allergies Sulfa antibiotics  History reviewed. No pertinent family history.  Social History History  Substance Use Topics  . Smoking status: Former Games developermoker  . Smokeless tobacco: Not on file  . Alcohol Use: No    Review of Systems  Constitutional: Negative for fever. Eyes: Negative for visual changes. ENT: Negative for sore throat. Cardiovascular: Negative for chest pain. Respiratory: Negative for shortness of breath. Gastrointestinal: Negative for abdominal pain, vomiting and diarrhea. Genitourinary: Negative for dysuria. Musculoskeletal: Negative for back pain. Skin: Negative for rash. Neurological: Negative for headaches, focal weakness or numbness.  10-point ROS otherwise negative.  ____________________________________________   PHYSICAL EXAM:  VITAL SIGNS: ED Triage Vitals  Enc Vitals Group     BP 03/18/15 0817 110/73 mmHg     Pulse Rate 03/18/15 0817 70     Resp 03/18/15 0817 18     Temp 03/18/15 0817 98 F (36.7 C)  Temp Source 03/18/15 0817 Oral     SpO2 03/18/15 0817 100 %     Weight 03/18/15 0817 200 lb (90.719 kg)     Height 03/18/15 0817 5\' 2"  (1.575 m)     Head Cir --      Peak Flow --      Pain Score 03/18/15 0819 8     Pain Loc --      Pain Edu? --      Excl. in GC? --    Constitutional: Alert and oriented. Well appearing and in no distress. Eyes: Conjunctivae are normal. PERRL. Normal extraocular movements. ENT   Head: Normocephalic and atraumatic.   Nose: No congestion/rhinnorhea.   Mouth/Throat: Mucous membranes are moist.   Neck: No stridor. Hematological/Lymphatic/Immunilogical: No cervical lymphadenopathy. Cardiovascular: Normal rate, regular rhythm. Normal and symmetric distal pulses are present in all extremities. No murmurs, rubs, or gallops. Respiratory: Normal respiratory effort without tachypnea nor retractions. Breath sounds are clear  and equal bilaterally. No wheezes/rales/rhonchi. Gastrointestinal: Soft and nontender. No distention. No abdominal bruits. There is no CVA tenderness. Genitourinary: normal external GU exam. Left labia with deep, firm, mobile, cystic lesion.  No overlying erythema, punctum, or pointing fluctuance. Musculoskeletal: Nontender with normal range of motion in all extremities. No joint effusions.  No lower extremity tenderness nor edema. Neurologic:  Normal speech and language. No gross focal neurologic deficits are appreciated. Speech is normal. No gait instability. Skin:  Skin is warm, dry and intact. No rash noted. Psychiatric: Mood and affect are normal. Speech and behavior are normal. Patient exhibits appropriate insight and judgment. ____________________________________________   PROCEDURES  Procedure(s) performed: None  Critical Care performed: No  ____________________________________________   INITIAL IMPRESSION / ASSESSMENT AND PLAN / ED COURSE  Patient with firm, deep, abscess without erythema, fluctuance, or pointing.  Suggest medication management and close follow-up for presumed I&D.   Pertinent labs & imaging results that were available during my care of the patient were reviewed by me and considered in my medical decision making (see chart for details). ___________________________________________  FINAL CLINICAL IMPRESSION(S) / ED DIAGNOSES  Final diagnoses:  Vulvar abscess    Lissa HoardJenise V Bacon Darrol Brandenburg, PA-C 03/18/15 1648  Governor Rooksebecca Lord, MD 03/20/15 [phone]

## 2015-03-20 ENCOUNTER — Inpatient Hospital Stay
Admission: EM | Admit: 2015-03-20 | Discharge: 2015-03-20 | DRG: 603 | Discharge status: Against Medical Advice | Payer: Medicare Other | Attending: Internal Medicine | Admitting: Internal Medicine

## 2015-03-20 ENCOUNTER — Emergency Department: Payer: Medicare Other

## 2015-03-20 DIAGNOSIS — K219 Gastro-esophageal reflux disease without esophagitis: Secondary | ICD-10-CM | POA: Diagnosis present

## 2015-03-20 DIAGNOSIS — Z8249 Family history of ischemic heart disease and other diseases of the circulatory system: Secondary | ICD-10-CM | POA: Diagnosis not present

## 2015-03-20 DIAGNOSIS — L03115 Cellulitis of right lower limb: Secondary | ICD-10-CM | POA: Diagnosis present

## 2015-03-20 DIAGNOSIS — I10 Essential (primary) hypertension: Secondary | ICD-10-CM | POA: Diagnosis present

## 2015-03-20 DIAGNOSIS — R234 Changes in skin texture: Secondary | ICD-10-CM

## 2015-03-20 DIAGNOSIS — L03319 Cellulitis of trunk, unspecified: Secondary | ICD-10-CM | POA: Diagnosis present

## 2015-03-20 DIAGNOSIS — Z79899 Other long term (current) drug therapy: Secondary | ICD-10-CM | POA: Diagnosis not present

## 2015-03-20 DIAGNOSIS — J449 Chronic obstructive pulmonary disease, unspecified: Secondary | ICD-10-CM | POA: Diagnosis present

## 2015-03-20 DIAGNOSIS — F419 Anxiety disorder, unspecified: Secondary | ICD-10-CM | POA: Diagnosis present

## 2015-03-20 DIAGNOSIS — Z87891 Personal history of nicotine dependence: Secondary | ICD-10-CM | POA: Diagnosis not present

## 2015-03-20 DIAGNOSIS — E039 Hypothyroidism, unspecified: Secondary | ICD-10-CM | POA: Diagnosis present

## 2015-03-20 DIAGNOSIS — L0390 Cellulitis, unspecified: Secondary | ICD-10-CM

## 2015-03-20 HISTORY — DX: Essential (primary) hypertension: I10

## 2015-03-20 LAB — COMPREHENSIVE METABOLIC PANEL
ALBUMIN: 4 g/dL (ref 3.5–5.0)
ALT: 22 U/L (ref 14–54)
AST: 18 U/L (ref 15–41)
Alkaline Phosphatase: 81 U/L (ref 38–126)
Anion gap: 8 (ref 5–15)
BUN: 9 mg/dL (ref 6–20)
CALCIUM: 9.1 mg/dL (ref 8.9–10.3)
CO2: 26 mmol/L (ref 22–32)
CREATININE: 0.91 mg/dL (ref 0.44–1.00)
Chloride: 104 mmol/L (ref 101–111)
GFR calc non Af Amer: >60 mL/min (ref >60)
Glucose, Bld: 95 mg/dL (ref 65–99)
Potassium: 3.4 mmol/L — ABNORMAL LOW (ref 3.5–5.1)
SODIUM: 138 mmol/L (ref 135–145)
Total Bilirubin: 0.4 mg/dL (ref 0.3–1.2)
Total Protein: 7.4 g/dL (ref 6.5–8.1)

## 2015-03-20 LAB — CBC WITH DIFFERENTIAL/PLATELET
BASOS ABS: 0.1 10*3/uL (ref 0–0.1)
Basophils Relative: 0 %
EOS PCT: 1 %
Eosinophils Absolute: 0.2 10*3/uL (ref 0–0.7)
HEMATOCRIT: 35.6 % (ref 35.0–47.0)
Hemoglobin: 12 g/dL (ref 12.0–16.0)
LYMPHS PCT: 16 %
Lymphs Abs: 2.2 10*3/uL (ref 1.0–3.6)
MCH: 29 pg (ref 26.0–34.0)
MCHC: 33.7 g/dL (ref 32.0–36.0)
MCV: 85.9 fL (ref 80.0–100.0)
MONOS PCT: 5 %
Monocytes Absolute: 0.7 10*3/uL (ref 0.2–0.9)
NEUTROS ABS: 10.7 10*3/uL — AB (ref 1.4–6.5)
Neutrophils Relative %: 78 %
Platelets: 314 10*3/uL (ref 150–440)
RBC: 4.14 MIL/uL (ref 3.80–5.20)
RDW: 14.8 % — AB (ref 11.5–14.5)
WBC: 13.8 10*3/uL — AB (ref 3.6–11.0)

## 2015-03-20 MED ORDER — ONDANSETRON HCL 4 MG/2ML IJ SOLN
INTRAMUSCULAR | Status: AC
  Filled 2015-03-20: qty 2

## 2015-03-20 MED ORDER — ONDANSETRON HCL 4 MG/2ML IJ SOLN
4.0000 mg | Freq: Once | INTRAMUSCULAR | Status: AC
  Administered 2015-03-20: 4 mg via INTRAVENOUS

## 2015-03-20 MED ORDER — HYDROCODONE-ACETAMINOPHEN 5-325 MG PO TABS
ORAL_TABLET | ORAL | Status: AC
  Administered 2015-03-20: 1 via ORAL
  Filled 2015-03-20: qty 1

## 2015-03-20 MED ORDER — MORPHINE SULFATE 4 MG/ML IJ SOLN
4.0000 mg | Freq: Once | INTRAMUSCULAR | Status: AC
  Administered 2015-03-20: 4 mg via INTRAVENOUS

## 2015-03-20 MED ORDER — CLINDAMYCIN PHOSPHATE 600 MG/50ML IV SOLN
600.0000 mg | Freq: Once | INTRAVENOUS | Status: AC
  Administered 2015-03-20: 600 mg via INTRAVENOUS

## 2015-03-20 MED ORDER — CLINDAMYCIN PHOSPHATE 600 MG/50ML IV SOLN
INTRAVENOUS | Status: AC
  Filled 2015-03-20: qty 50

## 2015-03-20 MED ORDER — OXYCODONE-ACETAMINOPHEN 5-325 MG PO TABS
ORAL_TABLET | ORAL | Status: AC
  Filled 2015-03-20: qty 1

## 2015-03-20 MED ORDER — MORPHINE SULFATE 4 MG/ML IJ SOLN
INTRAMUSCULAR | Status: AC
  Filled 2015-03-20: qty 1

## 2015-03-20 MED ORDER — HYDROCODONE-ACETAMINOPHEN 5-325 MG PO TABS
1.0000 | ORAL_TABLET | ORAL | Status: DC | PRN
  Administered 2015-03-20: 1 via ORAL

## 2015-03-20 MED ORDER — SODIUM CHLORIDE 0.9 % IV SOLN
Freq: Once | INTRAVENOUS | Status: AC
  Administered 2015-03-20: 16:00:00 via INTRAVENOUS

## 2015-03-20 NOTE — H&P (Signed)
[facility] [place] [facility]   PATIENT NAME: Rebecca Ramos    MR#:  [id number]  DATE OF BIRTH:  [birth year]  DATE OF ADMISSION:  03/20/2015  PRIMARY CARE PHYSICIAN: Jackson LatinoINGLEDUE,VICKI F, MD   REQUESTING/REFERRING PHYSICIAN: Chari ManningErica Gayle  CHIEF COMPLAINT:   Chief Complaint  Patient presents with  . Abscess    HISTORY OF PRESENT ILLNESS: Rebecca Ramos  is a 54 y.o. female with a known history of COPD hypertension and hypothyroidism. The last 2 weeks she started having multiple boils on the skin in different areas which was spreading all over. She came to emergency room 3 days ago and was given doxycycline. At that time she had reddening and severe pain on her inner aspect of right thigh. Then she started having more pain and redness on her pubic area also on her labia and it was severely painful. She tried to go to her PMD but could not get in touch, called urgent care Center but they told she already had been to the emergency room so she should go back to Hospital. Social he came to the emergency room, given the hospitalist for admission as cellulitis and failed outpatient therapy. ER physician also spoke to GYN on-call doctor and also after having an ultrasound of her left side labia which did not show any localized fluid collection but just soft tissue swelling.  GYN doctor suggested as the patient have multiple superficial cellulitis she needed to be admitted to medicine.  PAST MEDICAL HISTORY:   Past Medical History  Diagnosis Date  . Hypertension   . Acid reflux   . HTN (hypertension) 03/20/2015    PAST SURGICAL HISTORY:  Past Surgical History  Procedure Laterality Date  . Abdominal hysterectomy    . Cesarean section Bilateral   . Tonsillectomy    . Cesarean section Bilateral     SOCIAL HISTORY:  History  Substance Use Topics  . Smoking status: Former Games developermoker  . Smokeless tobacco: Not on file  . Alcohol Use: No    FAMILY HISTORY:Mother-  CAD. DRUG ALLERGIES:  Allergies  Allergen Reactions  . Sulfa Antibiotics Hives and Swelling    REVIEW OF SYSTEMS:   CONSTITUTIONAL: No fever, fatigue or weakness.  EYES: No blurred or double vision.  EARS, NOSE, AND THROAT: No tinnitus or ear pain.  RESPIRATORY: No cough, shortness of breath, wheezing or hemoptysis.  CARDIOVASCULAR: No chest pain, orthopnea, edema.  GASTROINTESTINAL: No nausea, vomiting, diarrhea or abdominal pain.  GENITOURINARY: No dysuria, hematuria. Pain on labia. ENDOCRINE: No polyuria, nocturia,  HEMATOLOGY: No anemia, easy bruising or bleeding SKIN: lesions as above, itching and painful- multiple sites. MUSCULOSKELETAL: No joint pain or arthritis.   NEUROLOGIC: No tingling, numbness, weakness.  PSYCHIATRY: No anxiety or depression.   MEDICATIONS AT HOME:  Prior to Admission medications   Medication Sig Start Date End Date Taking? Authorizing Provider  alprazolam Prudy Feeler(XANAX) 2 MG tablet Take 2 mg by mouth at bedtime as needed for sleep.    Historical Provider, MD  amLODipine (NORVASC) 10 MG tablet Take 10 mg by mouth daily.    Historical Provider, MD  doxycycline (VIBRA-TABS) 100 MG tablet Take 1 tablet (100 mg total) by mouth 2 (two) times daily. 03/18/15   Jenise V Bacon Menshew, PA-C  esomeprazole (NEXIUM) 40 MG capsule Take 40 mg by mouth daily at 12 noon.    Historical Provider, MD  fluticasone (FLONASE) 50 MCG/ACT nasal spray Place into both nostrils daily.    Historical Provider,  MD  lisinopril (PRINIVIL,ZESTRIL) 10 MG tablet Take 10 mg by mouth daily.    Historical Provider, MD  traMADol (ULTRAM) 50 MG tablet Take 1 tablet (50 mg total) by mouth every 6 (six) hours as needed. 03/18/15 03/17/16  Jenise V Bacon Menshew, PA-C  zolpidem (AMBIEN) 10 MG tablet Take 10 mg by mouth at bedtime as needed for sleep.    Historical Provider, MD      PHYSICAL EXAMINATION:   VITAL SIGNS: Blood pressure 105/69, pulse 65, temperature 97.4 F (36.3 C), temperature source  Oral, resp. rate 16, height 5\' 2"  (1.575 m), weight 200 lb (90.719 kg), SpO2 100 %.  GENERAL:  54 y.o.-year-old patient lying in the bed with no acute distress.  EYES: Pupils equal, round, reactive to light and accommodation. No scleral icterus. Extraocular muscles intact.  HEENT: Head atraumatic, normocephalic. Oropharynx and nasopharynx clear.  NECK:  Supple, no jugular venous distention. No thyroid enlargement, no tenderness.  LUNGS: Normal breath sounds bilaterally, no wheezing, rales,rhonchi or crepitation. No use of accessory muscles of respiration.  CARDIOVASCULAR: S1, S2 normal. No murmurs, rubs, or gallops.  ABDOMEN: Soft, nontender, nondistended. Bowel sounds present. No organomegaly or mass.  EXTREMITIES: No pedal edema, cyanosis, or clubbing.  NEUROLOGIC: Cranial nerves II through XII are intact. Muscle strength 5/5 in all extremities. Sensation intact. Gait not checked.  PSYCHIATRIC: The patient is alert and oriented x 3.  SKIN: on right side- medial thigh, left side labia both - red, warm and tender and swelling.     Multiple small 1-2 cm size red and tender areas on skin- On abdomen, face, Buttocks, arms.  LABORATORY PANEL:   CBC  Recent Labs Lab 03/20/15 1546  WBC 13.8*  HGB 12.0  HCT 35.6  PLT 314  MCV 85.9  MCH 29.0  MCHC 33.7  RDW 14.8*  LYMPHSABS 2.2  MONOABS 0.7  EOSABS 0.2  BASOSABS 0.1   ------------------------------------------------------------------------------------------------------------------  Chemistries   Recent Labs Lab 03/20/15 1546  NA 138  K 3.4*  CL 104  CO2 26  GLUCOSE 95  BUN 9  CREATININE 0.91  CALCIUM 9.1  AST 18  ALT 22  ALKPHOS 81  BILITOT 0.4   ------------------------------------------------------------------------------------------------------------------ estimated creatinine clearance is 76.6 mL/min (by C-G formula based on Cr of  0.91). ------------------------------------------------------------------------------------------------------------------ No results for input(s): TSH, T4TOTAL, T3FREE, THYROIDAB in the last 72 hours.  Invalid input(s): FREET3   Coagulation profile No results for input(s): INR, PROTIME in the last 168 hours. ------------------------------------------------------------------------------------------------------------------- No results for input(s): DDIMER in the last 72 hours. -------------------------------------------------------------------------------------------------------------------  Cardiac Enzymes No results for input(s): CKMB, TROPONINI, MYOGLOBIN in the last 168 hours.  Invalid input(s): CK ------------------------------------------------------------------------------------------------------------------ Invalid input(s): POCBNP  ---------------------------------------------------------------------------------------------------------------  Urinalysis No results found for: COLORURINE, APPEARANCEUR, LABSPEC, PHURINE, GLUCOSEU, HGBUR, BILIRUBINUR, KETONESUR, PROTEINUR, UROBILINOGEN, NITRITE, LEUKOCYTESUR   RADIOLOGY: [place] Misc Soft Tissue  03/20/2015   CLINICAL DATA:  Inflammation in the region of the left all blood, labia majora and symphysis pubis. Clinical concern for abscess.  EXAM: SOFT TISSUE ULTRASOUND - MISCELLANEOUS  TECHNIQUE: Ultrasound of the area of concern was performed.  COMPARISON:  None.  FINDINGS: Mild soft tissue edema.  No fluid collections seen.  IMPRESSION: Mild soft tissue edema without abscess.   Electronically Signed   By: Beckie SaltsSteven  Reid M.D.   On: 03/20/2015 17:13   IMPRESSION AND PLAN:  * Cellulitis  As there are multiple skin lesions this might be MRSA infection. Patient failed outpatient treatment with oral doxycycline. We will give IV  vancomycin here and wait for response.  * COPD Currently no active wheezing continue Flonase.  * Hypertension   Continue amlodipine and lisinopril.  * Gastroesophageal reflux disease Continue pantoprazole.  *Anxiety Continue Xanax.  All the records are reviewed and case discussed with ED provider. Management plans discussed with the patient & she is in agreement.  CODE STATUS:    TOTAL TIME TAKING CARE OF THIS PATIENT: 55 minutes.    Altamese Dilling M.D on 03/20/2015 at 7:39 PM  Between 7am to 6pm - Pager - [phone]  After 6pm go to [email or web address] - password [email or web address] [facility]  [facility]  Office  [phone]  CC: Primary care physician; Jackson Latino, MD

## 2015-03-20 NOTE — ED Notes (Signed)
Pt c/o increased pain and swelling to abscess on perineal area since being seen here on Wednesday and has been taking abx but states it is upsetting her stomach..Marland Kitchen

## 2015-03-20 NOTE — ED Notes (Signed)
Pt updated on delay of admission process and room assignment. Pt verbalizes understanding.

## 2015-03-20 NOTE — ED Notes (Signed)
Report to jasmine , rn on floor.

## 2015-03-20 NOTE — ED Provider Notes (Signed)
[facility] Emergency Department Provider Note ____________________________________________  Time seen: ----------------------------------------- 3:37 PM on 03/20/2015 -----------------------------------------   I have reviewed the triage vital signs and the nursing notes.   HISTORY  Chief Complaint Abscess    HPI Rebecca Ramos is a 54 y.o. female presents to the ER with complaints of multiple abscesses. States seen in ER 2 days ago for similar complaint. States at that time she was started on doxycycline antibiotic 100 mg by mouth twice a day. States has been taking the antibiotic however causing her to feel nauseated. States even while taking the antibiotic the area has increased in size and hardness and increasing pain. Also in that timeframe she has had multiple new "abscesses" appear. Reports pain to abscess and left vaginal area 10 out of 10 sharp pain. Denies any drainage. Reports chills and possible fever at home.     Past Medical History  Diagnosis Date  . Hypertension   . Acid reflux     There are no active problems to display for this patient.   Past Surgical History  Procedure Laterality Date  . Abdominal hysterectomy    . Cesarean section Bilateral   . Tonsillectomy    . Cesarean section Bilateral     Current Outpatient Rx  Name  Route  Sig  Dispense  Refill  . alprazolam (XANAX) 2 MG tablet   Oral   Take 2 mg by mouth at bedtime as needed for sleep.         Marland Kitchen. amLODipine (NORVASC) 10 MG tablet   Oral   Take 10 mg by mouth daily.         . cyanocobalamin 1000 MCG tablet   Intramuscular   Inject 1,000 mcg into the muscle every 30 (thirty) days.         Marland Kitchen. doxycycline (VIBRA-TABS) 100 MG tablet   Oral   Take 1 tablet (100 mg total) by mouth 2 (two) times daily.   20 tablet   0   . esomeprazole (NEXIUM) 40 MG capsule   Oral   Take 40 mg by mouth daily at 12 noon.         . fluticasone (FLONASE) 50 MCG/ACT nasal  spray   Each Nare   Place into both nostrils daily.         Marland Kitchen. lisinopril (PRINIVIL,ZESTRIL) 10 MG tablet   Oral   Take 10 mg by mouth daily.         . traMADol (ULTRAM) 50 MG tablet   Oral   Take 1 tablet (50 mg total) by mouth every 6 (six) hours as needed.   10 tablet   0   . zolpidem (AMBIEN) 10 MG tablet   Oral   Take 10 mg by mouth at bedtime as needed for sleep.           Allergies Sulfa antibiotics  No family history on file.  Social History History  Substance Use Topics  . Smoking status: Former Games developermoker  . Smokeless tobacco: Not on file  . Alcohol Use: No    Review of Systems Constitutional: Negative for fever. Eyes: Negative for visual changes. ENT: Negative for sore throat. Cardiovascular: Negative for chest pain. Respiratory: Negative for shortness of breath. Gastrointestinal: Negative for abdominal pain, vomiting and diarrhea. Genitourinary: Negative for dysuria. Musculoskeletal: Negative for back pain. Skin: as above Neurological: Negative for headaches, focal weakness or numbness.   10-point ROS otherwise negative.  ____________________________________________   PHYSICAL EXAM:  VITAL  SIGNS: ED Triage Vitals  Enc Vitals Group     BP 03/20/15 1457 110/70 mmHg     Pulse --      Resp 03/20/15 1457 16     Temp 03/20/15 1457 97.4 F (36.3 C)     Temp Source 03/20/15 1457 Oral     SpO2 03/20/15 1457 97 %     Weight 03/20/15 1457 200 lb (90.719 kg)     Height 03/20/15 1457 5\' 2"  (1.575 m)     Head Cir --      Peak Flow --      Pain Score 03/20/15 1458 8     Pain Loc --      Pain Edu? --      Excl. in GC? --      Constitutional: Alert and oriented. Well appearing and in no distress. Eyes: Conjunctivae are normal. PERRL. Normal extraocular movements. ENT   Head: Normocephalic and atraumatic.   Nose: No congestion/rhinnorhea.   Mouth/Throat: Mucous membranes are moist.   Neck: No  stridor. Hematological/Lymphatic/Immunilogical: No cervical lymphadenopathy. Cardiovascular: Normal rate, regular rhythm. Normal and symmetric distal pulses are present in all extremities. No murmurs, rubs, or gallops. Respiratory: Normal respiratory effort without tachypnea nor retractions. Breath sounds are clear and equal bilaterally. No wheezes/rales/rhonchi. Gastrointestinal: Soft and nontender. No distention. No abdominal bruits. There is no CVA tenderness. Genitourinary: With RN at bedside. No intravaginal induration or abscesses seen or palpated.  Musculoskeletal: Nontender with normal range of motion in all extremities. No joint effusions.  No lower extremity tenderness nor edema. Neurologic:  Normal speech and language. No gross focal neurologic deficits are appreciated. Speech is normal. No gait instability. Skin:   Left mons pubis indurated area, firm, 9 x 6.5 cm with mod surrounding erythema. Mod TTP. No fluctuance.   Right upper inner thigh with 3 x 3 indurated area with moderate surrounding erythema. Mod TTP. No fluctuance.   Multiple other <1cm superficial abscess scattered on buttocks, face and extremities.  Psychiatric: Mood and affect are normal. Speech and behavior are normal. Patient exhibits appropriate insight and judgment.  ____________________________________________    LABS (pertinent positives/negatives)  Labs Reviewed  CBC WITH DIFFERENTIAL/PLATELET - Abnormal; Notable for the following:    WBC 13.8 (*)    RDW 14.8 (*)    Neutro Abs 10.7 (*)    All other components within normal limits  COMPREHENSIVE METABOLIC PANEL - Abnormal; Notable for the following:    Potassium 3.4 (*)    All other components within normal limits  CULTURE, BLOOD (SINGLE)     _________________________________________    RADIOLOGY  EXAM: SOFT TISSUE ULTRASOUND - MISCELLANEOUS  TECHNIQUE: Ultrasound of the area of concern was performed.  COMPARISON: None.  FINDINGS: Mild  soft tissue edema. No fluid collections seen.  IMPRESSION: Mild soft tissue edema without abscess.   Electronically Signed By: Beckie SaltsSteven Reid M.D. On: 03/20/2015 17:13  ____________________________________________   ____________________________________________   INITIAL IMPRESSION / ASSESSMENT AND PLAN / ED COURSE  Pertinent labs & imaging results that were available during my care of the patient were reviewed by me and considered in my medical decision making (see chart for details). blood cultures obtained, clindamycin IV given in ER.  Presents for worsening cellulitis/abscess after being on doxycycline 2 days. Large firm indurated area to mons pubis, no abscess on ultrasound with surrounding cellulitis. Multiple other indurated areas with cellulitis. Plan to admit for worsening and failed outpatient treatment. Pt agreed to plan. Treat with IV antibiotics.  Discussed pt and plan with Dr Inocencio Homes who also saw pt.   1900: Discussed with Dr Bonney Aid OBGYN who recommend to be admitted through medical as no intravaginal abscess. Bonney Aid states will consult as needed.   Admit to hospitalist.  ____________________________________________   FINAL CLINICAL IMPRESSION(S) / ED DIAGNOSES  Final diagnoses:  Induration of skin  Cellulitis, unspecified cellulitis site, unspecified extremity site, unspecified laterality    Renford Dills, NP 03/20/15 1918  Gayla Doss, MD 03/20/15 [phone]

## 2015-03-20 NOTE — ED Notes (Signed)
Pt not in room. IV catheter in trash can with blood paper towel. Pt and pt's belongings with spouse are gone. Dr. Margaretmary Eddywhitting notified. Helene KelpLindsay miller, np notified, charge nurse Lea notified. Attempted to call pt at two numbers listed on chart, one says "you have the wrong number", the other number states "the person you are trying to reach is not available, the voice mailbox is full."

## 2015-03-20 NOTE — Progress Notes (Signed)
Patient ID: Rebecca DoppCarol S Pethtel, female   DOB: [birth year], 54 y.o.   MRN: [id number]  Notified by nursing staff that this patient left from the ER. As per nursing staff the patient was asking for dilaudid which was not ordered.

## 2015-03-25 LAB — CULTURE, BLOOD (SINGLE): Culture: NO GROWTH

## 2015-04-02 ENCOUNTER — Inpatient Hospital Stay: Payer: Medicare Other | Admitting: Internal Medicine

## 2015-04-02 ENCOUNTER — Encounter: Payer: Self-pay | Admitting: *Deleted

## 2015-05-04 ENCOUNTER — Other Ambulatory Visit: Payer: Self-pay | Admitting: Family Medicine

## 2015-05-04 DIAGNOSIS — R911 Solitary pulmonary nodule: Secondary | ICD-10-CM

## 2015-05-08 ENCOUNTER — Ambulatory Visit: Admission: RE | Admit: 2015-05-08 | Payer: Medicare Other | Source: Ambulatory Visit

## 2015-05-28 ENCOUNTER — Ambulatory Visit
Admission: RE | Admit: 2015-05-28 | Discharge: 2015-05-28 | Disposition: A | Discharge status: Home / Self Care | Payer: Medicare Other | Source: Ambulatory Visit | Attending: Family Medicine | Admitting: Family Medicine

## 2015-05-28 DIAGNOSIS — R911 Solitary pulmonary nodule: Secondary | ICD-10-CM | POA: Diagnosis present

## 2015-06-05 ENCOUNTER — Telehealth: Payer: Self-pay | Admitting: *Deleted

## 2015-06-05 NOTE — Telephone Encounter (Signed)
Message faxed to Fernande Boyden, MD with [facility] that pt was referred to the [facility], but never showed up for her consult visit. This was regarding a pulmonary embolus post hysterectomy; and does Dr. Reed Breech still need the pt to be seen at the [facility] by one of our hematologists for anticoagulation maintenance?

## 2015-06-10 ENCOUNTER — Other Ambulatory Visit: Payer: Self-pay

## 2015-06-10 ENCOUNTER — Encounter: Payer: Self-pay | Admitting: Emergency Medicine

## 2015-06-10 ENCOUNTER — Emergency Department
Admission: EM | Admit: 2015-06-10 | Discharge: 2015-06-10 | Disposition: A | Discharge status: Home / Self Care | Payer: Medicare Other | Attending: Emergency Medicine | Admitting: Emergency Medicine

## 2015-06-10 DIAGNOSIS — Z79899 Other long term (current) drug therapy: Secondary | ICD-10-CM | POA: Diagnosis not present

## 2015-06-10 DIAGNOSIS — Y998 Other external cause status: Secondary | ICD-10-CM | POA: Diagnosis not present

## 2015-06-10 DIAGNOSIS — R42 Dizziness and giddiness: Secondary | ICD-10-CM | POA: Insufficient documentation

## 2015-06-10 DIAGNOSIS — T679XXA Effect of heat and light, unspecified, initial encounter: Secondary | ICD-10-CM | POA: Insufficient documentation

## 2015-06-10 DIAGNOSIS — I10 Essential (primary) hypertension: Secondary | ICD-10-CM | POA: Diagnosis not present

## 2015-06-10 DIAGNOSIS — Y9389 Activity, other specified: Secondary | ICD-10-CM | POA: Diagnosis not present

## 2015-06-10 DIAGNOSIS — Z87891 Personal history of nicotine dependence: Secondary | ICD-10-CM | POA: Diagnosis not present

## 2015-06-10 DIAGNOSIS — X30XXXA Exposure to excessive natural heat, initial encounter: Secondary | ICD-10-CM | POA: Diagnosis not present

## 2015-06-10 DIAGNOSIS — Z792 Long term (current) use of antibiotics: Secondary | ICD-10-CM | POA: Diagnosis not present

## 2015-06-10 DIAGNOSIS — Y9289 Other specified places as the place of occurrence of the external cause: Secondary | ICD-10-CM | POA: Insufficient documentation

## 2015-06-10 LAB — BASIC METABOLIC PANEL
Anion gap: 11 (ref 5–15)
BUN: 16 mg/dL (ref 6–20)
CO2: 21 mmol/L — AB (ref 22–32)
CREATININE: 1.09 mg/dL — AB (ref 0.44–1.00)
Calcium: 9.1 mg/dL (ref 8.9–10.3)
Chloride: 106 mmol/L (ref 101–111)
GFR calc Af Amer: >60 mL/min (ref >60)
GFR, EST NON AFRICAN AMERICAN: 58 mL/min — AB (ref >60)
Glucose, Bld: 88 mg/dL (ref 65–99)
POTASSIUM: 3.8 mmol/L (ref 3.5–5.1)
Sodium: 138 mmol/L (ref 135–145)

## 2015-06-10 LAB — CBC WITH DIFFERENTIAL/PLATELET
Basophils Absolute: 0.1 10*3/uL (ref 0–0.1)
Basophils Relative: 1 %
Eosinophils Absolute: 0.1 10*3/uL (ref 0–0.7)
Eosinophils Relative: 1 %
HCT: 41.2 % (ref 35.0–47.0)
Hemoglobin: 14.5 g/dL (ref 12.0–16.0)
Lymphocytes Relative: 24 %
Lymphs Abs: 2 10*3/uL (ref 1.0–3.6)
MCH: 30.4 pg (ref 26.0–34.0)
MCHC: 35.3 g/dL (ref 32.0–36.0)
MCV: 86.2 fL (ref 80.0–100.0)
MONO ABS: 0.5 10*3/uL (ref 0.2–0.9)
Monocytes Relative: 6 %
NEUTROS PCT: 68 %
Neutro Abs: 5.7 10*3/uL (ref 1.4–6.5)
Platelets: 370 10*3/uL (ref 150–440)
RBC: 4.78 MIL/uL (ref 3.80–5.20)
RDW: 14.2 % (ref 11.5–14.5)
WBC: 8.3 10*3/uL (ref 3.6–11.0)

## 2015-06-10 MED ORDER — SODIUM CHLORIDE 0.9 % IV BOLUS (SEPSIS)
1000.0000 mL | Freq: Once | INTRAVENOUS | Status: AC
  Administered 2015-06-10: 1000 mL via INTRAVENOUS

## 2015-06-10 NOTE — ED Notes (Signed)
Brought in via ems from home   States she had been outside this afternoon helping with getting cabinets in .Marland Kitchen Became hot and dizzy

## 2015-06-10 NOTE — ED Provider Notes (Signed)
[facility] Emergency Department Provider Note   ____________________________________________  Time seen: 1505  I have reviewed the triage vital signs and the nursing notes.   HISTORY  Chief Complaint No chief complaint on file.   History limited by: Not Limited   HPI Rebecca Ramos is a 54 y.o. female who presents to the emergency department today via EMS because of concerns for heat exposure and dizziness. The patient states she was outside in the heat all day waiting for some cabinets to be delivered. She started becoming hot and feeling dizzy. She went inside to lie down. She states that the only thing she had to drink today or 2 beers. She was given Zofran by EMS and states that that did help resolve her nauseous feeling.     Past Medical History  Diagnosis Date  . Hypertension   . Acid reflux   . HTN (hypertension) 03/20/2015    Patient Active Problem List   Diagnosis Date Noted  . Cellulitis 03/20/2015  . HTN (hypertension) 03/20/2015    Past Surgical History  Procedure Laterality Date  . Abdominal hysterectomy    . Cesarean section Bilateral   . Tonsillectomy    . Cesarean section Bilateral     Current Outpatient Rx  Name  Route  Sig  Dispense  Refill  . alprazolam (XANAX) 2 MG tablet   Oral   Take 1 mg by mouth 4 (four) times daily.          Marland Kitchen amLODipine (NORVASC) 10 MG tablet   Oral   Take 10 mg by mouth daily.         Marland Kitchen doxycycline (VIBRA-TABS) 100 MG tablet   Oral   Take 1 tablet (100 mg total) by mouth 2 (two) times daily.   20 tablet   0   . esomeprazole (NEXIUM) 40 MG capsule   Oral   Take 40 mg by mouth daily.          . fluticasone (FLONASE) 50 MCG/ACT nasal spray   Each Nare   Place 2 sprays into both nostrils daily as needed for rhinitis.          Marland Kitchen lisinopril (PRINIVIL,ZESTRIL) 10 MG tablet   Oral   Take 10 mg by mouth daily.         . Oxycodone HCl 10 MG TABS   Oral   Take 10 mg by mouth 3  (three) times daily as needed (for pain).         . traMADol (ULTRAM) 50 MG tablet   Oral   Take 1 tablet (50 mg total) by mouth every 6 (six) hours as needed. Patient taking differently: Take 50 mg by mouth every 6 (six) hours as needed for moderate pain.    10 tablet   0   . zolpidem (AMBIEN) 10 MG tablet   Oral   Take 10 mg by mouth at bedtime as needed for sleep.           Allergies Sulfa antibiotics  No family history on file.  Social History History  Substance Use Topics  . Smoking status: Former Games developer  . Smokeless tobacco: Not on file  . Alcohol Use: No    Review of Systems  Constitutional: Negative for fever. Cardiovascular: Negative for chest pain. Respiratory: Negative for shortness of breath. Gastrointestinal: Negative for abdominal pain, vomiting and diarrhea. Genitourinary: Negative for dysuria. Musculoskeletal: Negative for back pain. Skin: Negative for rash. Neurological: Positive for dizziness  10-point ROS otherwise negative.  ____________________________________________   PHYSICAL EXAM:  VITAL SIGNS:  98.7 F (37.1 C)  75  18  109/74 mmHg  98 %   Constitutional: Alert and oriented. Well appearing and in no distress. Eyes: Conjunctivae are normal. PERRL. Normal extraocular movements. ENT   Head: Normocephalic and atraumatic.   Nose: No congestion/rhinnorhea.   Mouth/Throat: Mucous membranes are moist.   Neck: No stridor. Hematological/Lymphatic/Immunilogical: No cervical lymphadenopathy. Cardiovascular: Normal rate, regular rhythm.  No murmurs, rubs, or gallops. Respiratory: Normal respiratory effort without tachypnea nor retractions. Breath sounds are clear and equal bilaterally. No wheezes/rales/rhonchi. Gastrointestinal: Soft and nontender. No distention. There is no CVA tenderness. Genitourinary: Deferred Musculoskeletal: Normal range of motion in all extremities. No joint effusions.  No lower extremity tenderness  nor edema. Neurologic:  Normal speech and language. No gross focal neurologic deficits are appreciated. Speech is normal.  Skin:  Skin is warm, dry and intact. No rash noted. Psychiatric: Mood and affect are normal. Speech and behavior are normal. Patient exhibits appropriate insight and judgment.  ____________________________________________    LABS (pertinent positives/negatives)  Labs Reviewed  BASIC METABOLIC PANEL - Abnormal; Notable for the following:    CO2 21 (*)    Creatinine, Ser 1.09 (*)    GFR calc non Af Amer 58 (*)    All other components within normal limits  CBC WITH DIFFERENTIAL/PLATELET     ____________________________________________   EKG  I, Phineas Semen, attending physician, personally viewed and interpreted this EKG  EKG Time: 1518 Rate: 76 Rhythm: NSR Axis: left axis deviation Intervals: qtc 443 QRS: narrow ST changes: no st elevation    ____________________________________________    RADIOLOGY  None  ____________________________________________   PROCEDURES  Procedure(s) performed: None  Critical Care performed: No  ____________________________________________   INITIAL IMPRESSION / ASSESSMENT AND PLAN / ED COURSE  Pertinent labs & imaging results that were available during my care of the patient were reviewed by me and considered in my medical decision making (see chart for details).  Patient presents to the emergency department because of concerns for feeling nausea and dizzy. Patient had been outside for a prolonged time. On exam patient awake, alert no focal neuro deficits are physical findings. Patient felt better after fluid bolus. Likely patient symptoms related to heat exhaustion. Dehydration. Will discharge home.  ____________________________________________   FINAL CLINICAL IMPRESSION(S) / ED DIAGNOSES  Final diagnoses:  Heat exposure, initial encounter     Phineas Semen, MD 06/10/15 1950

## 2015-06-10 NOTE — ED Notes (Signed)
States she became hot while being outside this am. Then felt nauseated and dizzy.. Then went inside had may have passed out . Does not remember how she got in the bed

## 2015-06-10 NOTE — Discharge Instructions (Signed)
Please seek medical attention for any high fevers, chest pain, shortness of breath, change in behavior, persistent vomiting, bloody stool or any other new or concerning symptoms.  Heat-Related Illness Heat-related illnesses occur when the body is unable to properly cool itself. The body normally cools itself by sweating. However, under some conditions sweating is not enough. In these cases, a person's body temperature rises rapidly. Very high body temperatures may damage the brain or other vital organs. Some examples of heat-related illnesses include:  Heat stroke. This occurs when the body is unable to regulate its temperature. The body's temperature rises rapidly, the sweating mechanism fails, and the body is unable to cool down. Body temperature may rise to 106 F (41 C) or higher within 10 to 15 minutes. Heat stroke can cause death or permanent disability if emergency treatment is not provided.  Heat exhaustion. This is a milder form of heat-related illness that can develop after several days of exposure to high temperatures and not enough fluids. It is the body's response to an excessive loss of the water and salt contained in sweat.  Heat cramps. These usually affect people who sweat a lot during heavy activity. This sweating drains the body's salt and moisture. The low salt level in the muscles causes painful cramps. Heat cramps may also be a symptom of heat exhaustion. Heat cramps usually occur in the abdomen, arms, or legs. Get medical attention for cramps if you have heart problems or are on a low-sodium diet. Those that are at greatest risk for heat-related illnesses include:   The elderly.  Infant and the very young.  People with mental illness and chronic diseases.  People who are overweight (obese).  Young and healthy people can even succumb to heat if they participate in strenuous physical activities during hot weather. CAUSES  Several factors affect the body's ability to cool  itself during extremely hot weather. When the humidity is high, sweat will not evaporate as quickly. This prevents the body from releasing heat quickly. Other factors that can affect the body's ability to cool down include:   Age.  Obesity.  Fever.  Dehydration.  Heart disease.  Mental illness.  Poor circulation.  Sunburn.  Prescription drug use.  Alcohol use. SYMPTOMS  Heat stroke: Warning signs of heat stroke vary, but may include:  An extremely high body temperature (above 103F orally).  A fast, strong pulse.  Dizziness.  Confusion.  Red, hot, and dry skin.  No sweating.  Throbbing headache.  Feeling sick to your stomach (nauseous).  Unconsciousness. Heat exhaustion: Warning signs of heat exhaustion include:  Heavy sweating.  Tiredness.  Headache.  Paleness.  Weakness.  Feeling sick to your stomach (nauseous) or vomiting.  Muscle cramps. Heat cramps  Muscle pains or spasms. TREATMENT  Heat stroke  Get into a cool environment. An indoor place that is air-conditioned may be best.  Take a cool shower or bath. Have someone around to make sure you are okay.  Take your temperature. Make sure it is going down. Heat exhaustion  Drink plenty of fluids. Do not drink liquids that contain caffeine, alcohol, or large amounts of sugar. These cause you to lose more body fluid. Also, avoid very cold drinks. They can cause stomach cramps.  Get into a cool environment. An indoor place that is air-conditioned may be best.  Take a cool shower or bath. Have someone around to make sure you are okay.  Put on lightweight clothing. Heat cramps  Stop whatever activity you  were doing. Do not attempt to do that activity for at least 3 hours after the cramps have gone away.  Get into a cool environment. An indoor place that is air-conditioned may be best. HOME CARE INSTRUCTIONS  To protect your health when temperatures are extremely high, follow these  tips:  During heavy exercise in a hot environment, drink two to four glasses (16-32 ounces) of cool fluids each hour. Do not wait until you are thirsty to drink. Warning: If your caregiver limits the amount of fluid you drink or has you on water pills, ask how much you should drink while the weather is hot.  Do not drink liquids that contain caffeine, alcohol, or large amounts of sugar. These cause you to lose more body fluid.  Avoid very cold drinks. They can cause stomach cramps.  Wear appropriate clothing. Choose lightweight, light-colored, loose-fitting clothing.  If you must be outdoors, try to limit your outdoor activity to morning and evening hours. Try to rest often in shady areas.  If you are not used to working or exercising in a hot environment, start slowly and pick up the pace gradually.  Stay cool in an air-conditioned place if possible. If your home does not have air conditioning, go to the shopping mall or [employer].  Taking a cool shower or bath may help you cool off. SEEK MEDICAL CARE IF:   You see any of the symptoms listed above. You may be dealing with a life-threatening emergency.  Symptoms worsen or last longer than 1 hour.  Heat cramps do not get better in 1 hour. MAKE SURE YOU:   Understand these instructions.  Will watch your condition.  Will get help right away if you are not doing well or get worse. Document Released: 08/09/2008 Document Revised: 01/23/2012 Document Reviewed: 08/09/2008 [facility] Patient Information 2015 [place], [place]. This information is not intended to replace advice given to you by your health care provider. Make sure you discuss any questions you have with your health care provider.

## 2015-07-28 ENCOUNTER — Emergency Department
Admission: EM | Admit: 2015-07-28 | Discharge: 2015-07-28 | Discharge status: Against Medical Advice | Payer: Medicare Other | Attending: Emergency Medicine | Admitting: Emergency Medicine

## 2015-07-28 ENCOUNTER — Encounter: Payer: Self-pay | Admitting: Emergency Medicine

## 2015-07-28 ENCOUNTER — Telehealth: Payer: Self-pay | Admitting: Emergency Medicine

## 2015-07-28 DIAGNOSIS — I10 Essential (primary) hypertension: Secondary | ICD-10-CM | POA: Diagnosis not present

## 2015-07-28 DIAGNOSIS — R140 Abdominal distension (gaseous): Secondary | ICD-10-CM | POA: Insufficient documentation

## 2015-07-28 DIAGNOSIS — R109 Unspecified abdominal pain: Secondary | ICD-10-CM | POA: Insufficient documentation

## 2015-07-28 DIAGNOSIS — Z87891 Personal history of nicotine dependence: Secondary | ICD-10-CM | POA: Diagnosis not present

## 2015-07-28 LAB — URINALYSIS COMPLETE WITH MICROSCOPIC (ARMC ONLY)
BILIRUBIN URINE: NEGATIVE
Glucose, UA: NEGATIVE mg/dL
Hgb urine dipstick: NEGATIVE
Ketones, ur: NEGATIVE mg/dL
LEUKOCYTES UA: NEGATIVE
NITRITE: NEGATIVE
Protein, ur: NEGATIVE mg/dL
Specific Gravity, Urine: 1.026 (ref 1.005–1.030)
pH: 5 (ref 5.0–8.0)

## 2015-07-28 NOTE — ED Notes (Signed)
Pt presents with bloating for two weeks now with some pain.

## 2015-07-28 NOTE — ED Notes (Signed)
Called patient due to lwot to inquire about condition and follow up plans. Pt has iv recorded and is not in lobby .  Attempted to call and phone says unavailable.

## 2015-08-03 ENCOUNTER — Emergency Department
Admission: EM | Admit: 2015-08-03 | Discharge: 2015-08-03 | Disposition: A | Discharge status: Home / Self Care | Payer: Medicare Other | Attending: Student | Admitting: Student

## 2015-08-03 ENCOUNTER — Encounter: Payer: Self-pay | Admitting: Emergency Medicine

## 2015-08-03 DIAGNOSIS — K120 Recurrent oral aphthae: Secondary | ICD-10-CM | POA: Diagnosis not present

## 2015-08-03 DIAGNOSIS — Z79899 Other long term (current) drug therapy: Secondary | ICD-10-CM | POA: Insufficient documentation

## 2015-08-03 DIAGNOSIS — L739 Follicular disorder, unspecified: Secondary | ICD-10-CM | POA: Diagnosis not present

## 2015-08-03 DIAGNOSIS — I10 Essential (primary) hypertension: Secondary | ICD-10-CM | POA: Insufficient documentation

## 2015-08-03 DIAGNOSIS — F419 Anxiety disorder, unspecified: Secondary | ICD-10-CM | POA: Diagnosis not present

## 2015-08-03 DIAGNOSIS — Z87891 Personal history of nicotine dependence: Secondary | ICD-10-CM | POA: Insufficient documentation

## 2015-08-03 DIAGNOSIS — Z792 Long term (current) use of antibiotics: Secondary | ICD-10-CM | POA: Insufficient documentation

## 2015-08-03 DIAGNOSIS — R21 Rash and other nonspecific skin eruption: Secondary | ICD-10-CM | POA: Diagnosis present

## 2015-08-03 MED ORDER — CEPHALEXIN 500 MG PO CAPS: 500.0000 mg | ORAL_CAPSULE | Freq: Four times a day (QID) | ORAL | Status: AC

## 2015-08-03 MED ORDER — LIDOCAINE VISCOUS 2 % MT SOLN
15.0000 mL | Freq: Once | OROMUCOSAL | Status: AC
  Administered 2015-08-03: 15 mL via OROMUCOSAL
  Filled 2015-08-03: qty 15

## 2015-08-03 MED ORDER — MAGIC MOUTHWASH W/LIDOCAINE: 5.0000 mL | Freq: Four times a day (QID) | ORAL | Status: DC

## 2015-08-03 NOTE — ED Notes (Signed)
Poss abscess to groin area  And blisters in mouth for couple of days

## 2015-08-03 NOTE — Discharge Instructions (Signed)
Canker Sores  Canker sores are painful, open sores on the inside of the mouth and cheek. They may be white or yellow. The sores usually heal in 1 to 2 weeks. Women are more likely than men to have recurrent canker sores. CAUSES The cause of canker sores is not well understood. More than one cause is likely. Canker sores do not appear to be caused by certain types of germs (viruses or bacteria). Canker sores may be caused by:  An allergic reaction to certain foods.  Digestive problems.  Not having enough vitamin B12, folic acid, and iron.  Female sex hormones. Sores may come only during certain phases of a menstrual cycle. Often, there is improvement during pregnancy.  Genetics. Some people seem to inherit canker sore problems. Emotional stress and injuries to the mouth may trigger outbreaks, but not cause them.  DIAGNOSIS Canker sores are diagnosed by exam.  TREATMENT  Patients who have frequent bouts of canker sores may have cultures taken of the sores, blood tests, or allergy tests. This helps determine if their sores are caused by a poor diet, an allergy, or some other preventable or treatable disease.  Vitamins may prevent recurrences or reduce the severity of canker sores in people with poor nutrition.  Numbing ointments can relieve pain. These are available in drug stores without a prescription.  Anti-inflammatory steroid mouth rinses or gels may be prescribed by your caregiver for severe sores.  Oral steroids may be prescribed if you have severe, recurrent canker sores. These strong medicines can cause many side effects and should be used only under the close direction of a dentist or physician.  Mouth rinses containing the antibiotic medicine may be prescribed. They may lessen symptoms and speed healing. Healing usually happens in about 1 or 2 weeks with or without treatment. Certain antibiotic mouth rinses given to pregnant women and young children can permanently stain teeth.  Talk to your caregiver about your treatment. HOME CARE INSTRUCTIONS   Avoid foods that cause canker sores for you.  Avoid citrus juices, spicy or salty foods, and coffee until the sores are healed.  Use a soft-bristled toothbrush.  Chew your food carefully to avoid biting your cheek.  Apply topical numbing medicine to the sore to help relieve pain.  Apply a thin paste of baking soda and water to the sore to help heal the sore.  Only use mouth rinses or medicines for pain or discomfort as directed by your caregiver. SEEK MEDICAL CARE IF:   Your symptoms are not better in 1 week.  Your sores are still present after 2 weeks.  Your sores are very painful.  You have trouble breathing or swallowing.  Your sores come back frequently. Document Released: 02/25/2011 Document Revised: 02/25/2013 Document Reviewed: 02/25/2011 [facility] Patient Information 2015 [place], [place]. This information is not intended to replace advice given to you by your health care provider. Make sure you discuss any questions you have with your health care provider. Folliculitis  Folliculitis is redness, soreness, and swelling (inflammation) of the hair follicles. This condition can occur anywhere on the body. People with weakened immune systems, diabetes, or obesity have a greater risk of getting folliculitis. CAUSES  Bacterial infection. This is the most common cause.  Fungal infection.  Viral infection.  Contact with certain chemicals, especially oils and tars. Long-term folliculitis can result from bacteria that live in the nostrils. The bacteria may trigger multiple outbreaks of folliculitis over time. SYMPTOMS Folliculitis most commonly occurs on the scalp, thighs,  legs, back, buttocks, and areas where hair is shaved frequently. An early sign of folliculitis is a small, white or yellow, pus-filled, itchy lesion (pustule). These lesions appear on a red, inflamed follicle. They are usually less than 0.2  inches (5 mm) wide. When there is an infection of the follicle that goes deeper, it becomes a boil or furuncle. A group of closely packed boils creates a larger lesion (carbuncle). Carbuncles tend to occur in hairy, sweaty areas of the body. DIAGNOSIS  Your caregiver can usually tell what is wrong by doing a physical exam. A sample may be taken from one of the lesions and tested in a lab. This can help determine what is causing your folliculitis. TREATMENT  Treatment may include:  Applying warm compresses to the affected areas.  Taking antibiotic medicines orally or applying them to the skin.  Draining the lesions if they contain a large amount of pus or fluid.  Laser hair removal for cases of long-lasting folliculitis. This helps to prevent regrowth of the hair. HOME CARE INSTRUCTIONS  Apply warm compresses to the affected areas as directed by your caregiver.  If antibiotics are prescribed, take them as directed. Finish them even if you start to feel better.  You may take over-the-counter medicines to relieve itching.  Do not shave irritated skin.  Follow up with your caregiver as directed. SEEK IMMEDIATE MEDICAL CARE IF:   You have increasing redness, swelling, or pain in the affected area.  You have a fever. MAKE SURE YOU:  Understand these instructions.  Will watch your condition.  Will get help right away if you are not doing well or get worse. Document Released: 01/09/2002 Document Revised: 05/01/2012 Document Reviewed: 01/31/2012 [facility] Patient Information 2015 [place], [place]. This information is not intended to replace advice given to you by your health care provider. Make sure you discuss any questions you have with your health care provider.

## 2015-08-03 NOTE — ED Notes (Signed)
Pt to ed with c/o ? Abscess to groin area and also c/o blisters in mouth.

## 2015-08-03 NOTE — ED Provider Notes (Signed)
[facility] Emergency Department Provider Note  ____________________________________________  Time seen: Approximately 11:04 AM  I have reviewed the triage vital signs and the nursing notes.   HISTORY  Chief Complaint Rash    HPI Rebecca Ramos is a 54 y.o. female presenting with a 3 day history of mouth sores and a one day history of a sore in her groin area. Patient has a history of abscesses and sores but denies history of HSV, STDs, and HPV. Current mouth sores are located on inside of bottom and upper lip, 2 on each. They are painful 7/10 dull pain, made worse when touched. Pain makes it difficult to eat and drink. Denies itchiness, blood, or pus. Pain unrelieved with BC powder. Patient has history of dental caries and has regular dental care.   Patient also states that she noticed a spot on the superior aspect of her groin (mons pubis) described as small, slightly erythematous and painful. She has noticed no blood or pus from the lesion. Denies itchiness, radiation of pain, dysuria, frequency, urgency, or vaginal discharge.    Past Medical History  Diagnosis Date  . Hypertension   . Acid reflux   . HTN (hypertension) 03/20/2015    Patient Active Problem List   Diagnosis Date Noted  . Cellulitis 03/20/2015  . HTN (hypertension) 03/20/2015    Past Surgical History  Procedure Laterality Date  . Abdominal hysterectomy    . Cesarean section Bilateral   . Tonsillectomy    . Cesarean section Bilateral     Current Outpatient Rx  Name  Route  Sig  Dispense  Refill  . alprazolam (XANAX) 2 MG tablet   Oral   Take 1 mg by mouth 4 (four) times daily.          Marland Kitchen amLODipine (NORVASC) 10 MG tablet   Oral   Take 10 mg by mouth daily.         Marland Kitchen doxycycline (VIBRA-TABS) 100 MG tablet   Oral   Take 1 tablet (100 mg total) by mouth 2 (two) times daily.   20 tablet   0   . esomeprazole (NEXIUM) 40 MG capsule   Oral   Take 40 mg by mouth daily.           . fluticasone (FLONASE) 50 MCG/ACT nasal spray   Each Nare   Place 2 sprays into both nostrils daily as needed for rhinitis.          Marland Kitchen lisinopril (PRINIVIL,ZESTRIL) 10 MG tablet   Oral   Take 10 mg by mouth daily.         . Oxycodone HCl 10 MG TABS   Oral   Take 10 mg by mouth 3 (three) times daily as needed (for pain).         . traMADol (ULTRAM) 50 MG tablet   Oral   Take 1 tablet (50 mg total) by mouth every 6 (six) hours as needed. Patient taking differently: Take 50 mg by mouth every 6 (six) hours as needed for moderate pain.    10 tablet   0   . zolpidem (AMBIEN) 10 MG tablet   Oral   Take 10 mg by mouth at bedtime as needed for sleep.           Allergies Sulfa antibiotics  History reviewed. No pertinent family history.  Social History Social History  Substance Use Topics  . Smoking status: Former Games developer  . Smokeless tobacco: None  . Alcohol Use:  No    Review of Systems Constitutional: No fever/chills Eyes: No visual changes. ENT: No sore throat. Cardiovascular: Denies chest pain. Respiratory: Denies shortness of breath. Gastrointestinal: No abdominal pain.  No nausea, no vomiting.  No diarrhea.  No constipation. Genitourinary: Negative for dysuria. Musculoskeletal: Negative for back pain. Skin: Oral lesions inside lower lip. Rash in the groin area. Neurological: Negative for headaches, focal weakness or numbness. Psychiatric:Anxiety Endocrine:Hypertension Hematological/Lymphatic: Allergic/Immunilogical: Sulfur antibodies  10-point ROS otherwise negative.  ____________________________________________   PHYSICAL EXAM:  VITAL SIGNS: ED Triage Vitals  Enc Vitals Group     BP 08/03/15 0952 138/66 mmHg     Pulse Rate 08/03/15 0952 66     Resp 08/03/15 0952 20     Temp 08/03/15 0952 98.2 F (36.8 C)     Temp Source 08/03/15 0952 Oral     SpO2 08/03/15 0952 100 %     Weight 08/03/15 0952 190 lb (86.183 kg)     Height 08/03/15  0952  (1.575 m)     Head Cir --      Peak Flow --      Pain Score 08/03/15 0952 7     Pain Loc --      Pain Edu? --      Excl. in GC? --     Constitutional: Alert and oriented. Well appearing and in no acute distress. Eyes: Conjunctivae are normal. PERRL. EOMI. Head: Atraumatic. Nose: No congestion/rhinnorhea. Mouth/Throat: Mucous membranes are moist.  Oropharynx non-erythematous. Neck: No stridor.   Hematological/Lymphatic/Immunilogical: No cervical lymphadenopathy. Cardiovascular: Normal rate, regular rhythm. Grossly normal heart sounds.  Good peripheral circulation. Respiratory: Normal respiratory effort.  No retractions. Lungs CTAB. Gastrointestinal: Soft and nontender. No distention. No abdominal bruits. No CVA tenderness. Musculoskeletal: No lower extremity tenderness nor edema.  No joint effusions. Neurologic:  Normal speech and language. No gross focal neurologic deficits are appreciated. No gait instability. Skin: Oval lesions inside lower lip. Papular lesion on erythematous base left groin area Psychiatric: Mood and affect are normal. Speech and behavior are normal.  ____________________________________________   LABS (all labs ordered are listed, but only abnormal results are displayed)  Labs Reviewed - No data to display ____________________________________________  EKG  None ____________________________________________  RADIOLOGY  None ____________________________________________   PROCEDURES  Procedure(s) performed: None  Critical Care performed: No  ____________________________________________   INITIAL IMPRESSION / ASSESSMENT AND PLAN / ED COURSE  Pertinent labs & imaging results that were available during my care of the patient were reviewed by me and considered in my medical decision making (see chart for details).  Aphthous ulcers of mouth. Viscous lidocaine given for pain relief in the ED. Rx Magic Mouthwash.   ____________________________________________   FINAL CLINICAL IMPRESSION(S) / ED DIAGNOSES  Final diagnoses:  Aphthous ulcer of mouth  Folliculitis     Joni Reining, PA-C 08/03/15 1120  Gayla Doss, MD 08/13/15 1945

## 2015-08-12 ENCOUNTER — Inpatient Hospital Stay: Payer: Medicare Other | Attending: Family Medicine

## 2015-08-12 ENCOUNTER — Inpatient Hospital Stay: Payer: Medicare Other | Admitting: Family Medicine

## 2015-08-30 ENCOUNTER — Encounter: Payer: Self-pay | Admitting: Emergency Medicine

## 2015-08-30 ENCOUNTER — Emergency Department
Admission: EM | Admit: 2015-08-30 | Discharge: 2015-08-30 | Disposition: A | Discharge status: Home / Self Care | Payer: Medicare Other | Attending: Emergency Medicine | Admitting: Emergency Medicine

## 2015-08-30 DIAGNOSIS — Z792 Long term (current) use of antibiotics: Secondary | ICD-10-CM | POA: Insufficient documentation

## 2015-08-30 DIAGNOSIS — Z87891 Personal history of nicotine dependence: Secondary | ICD-10-CM | POA: Insufficient documentation

## 2015-08-30 DIAGNOSIS — Z79899 Other long term (current) drug therapy: Secondary | ICD-10-CM | POA: Insufficient documentation

## 2015-08-30 DIAGNOSIS — I10 Essential (primary) hypertension: Secondary | ICD-10-CM | POA: Insufficient documentation

## 2015-08-30 DIAGNOSIS — Z7951 Long term (current) use of inhaled steroids: Secondary | ICD-10-CM | POA: Insufficient documentation

## 2015-08-30 DIAGNOSIS — K122 Cellulitis and abscess of mouth: Secondary | ICD-10-CM | POA: Insufficient documentation

## 2015-08-30 DIAGNOSIS — R220 Localized swelling, mass and lump, head: Secondary | ICD-10-CM | POA: Diagnosis present

## 2015-08-30 MED ORDER — TRAMADOL HCL 50 MG PO TABS: 50.0000 mg | ORAL_TABLET | Freq: Two times a day (BID) | ORAL | Status: DC

## 2015-08-30 MED ORDER — AMOXICILLIN-POT CLAVULANATE 875-125 MG PO TABS: 1.0000 | ORAL_TABLET | Freq: Two times a day (BID) | ORAL | Status: DC

## 2015-08-30 NOTE — Discharge Instructions (Signed)
Abscess An abscess (boil or furuncle) is an infected area on or under the skin. This area is filled with yellowish-white fluid (pus) and other material (debris). HOME CARE   Only take medicines as told by your doctor.  If you were given antibiotic medicine, take it as directed. Finish the medicine even if you start to feel better.  If gauze is used, follow your doctor's directions for changing the gauze.  To avoid spreading the infection:  Keep your abscess covered with a bandage.  Wash your hands well.  Do not share personal care items, towels, or whirlpools with others.  Avoid skin contact with others.  Keep your skin and clothes clean around the abscess.  Keep all doctor visits as told. GET HELP RIGHT AWAY IF:   You have more pain, puffiness (swelling), or redness in the wound site.  You have more fluid or blood coming from the wound site.  You have muscle aches, chills, or you feel sick.  You have a fever. MAKE SURE YOU:   Understand these instructions.  Will watch your condition.  Will get help right away if you are not doing well or get worse.   This information is not intended to replace advice given to you by your health care provider. Make sure you discuss any questions you have with your health care provider.   Document Released: 04/18/2008 Document Revised: 05/01/2012 Document Reviewed: 01/14/2012 Elsevier Interactive Patient Education [employer]2016 [employer]  Take the antibiotic as directed.  Follow-up with your dental provider or primary care provider as planned. Apply warm compresses to reduce pain and swelling.

## 2015-08-30 NOTE — ED Notes (Signed)
Pt reports repeat mouth abscess over the past year. Denies fever. Pt report fever Swelling to left side of face. Denies drainage from abscess.

## 2015-08-30 NOTE — ED Provider Notes (Signed)
[facility] Emergency Department Provider Note ____________________________________________  Time seen: 0758  I have reviewed the triage vital signs and the nursing notes.  HISTORY  Chief Complaint  Oral Swelling  HPI Rebecca Ramos is a 54 y.o. female reports to the ED for evaluation of felt swelling to the left lower jaw over the last to 3 days. The patient was seen here about a month prior and placed on clindamycin, for a cutaneous abscess in the groin.In the interim she apparently is follow-up with her primary care provider as well as had [facility]. She claims now to be on a 7 day course of clindamycin from her primary care provider. She is currently doesn't the medication twice a day. Despite that regimen she has developed this acute buccal abscess. She denies any interim fever, chills, sweats, or spontaneous drainage from the abscess. She also denies any poor dentition or cavities, despite having several extractions to the lower jaw. She rates her pain at 9/10 in triage.  Past Medical History  Diagnosis Date  . Hypertension   . Acid reflux   . HTN (hypertension) 03/20/2015    Patient Active Problem List   Diagnosis Date Noted  . Cellulitis 03/20/2015  . HTN (hypertension) 03/20/2015    Past Surgical History  Procedure Laterality Date  . Abdominal hysterectomy    . Cesarean section Bilateral   . Tonsillectomy    . Cesarean section Bilateral   . Elbow surgery      bilateral  . Thumb surgery      right     Current Outpatient Rx  Name  Route  Sig  Dispense  Refill  . alprazolam (XANAX) 2 MG tablet   Oral   Take 1 mg by mouth 4 (four) times daily.          Marland Kitchen. amLODipine (NORVASC) 10 MG tablet   Oral   Take 10 mg by mouth daily.         Marland Kitchen. amoxicillin-clavulanate (AUGMENTIN) 875-125 MG tablet   Oral   Take 1 tablet by mouth 2 (two) times daily.   20 tablet   0   . doxycycline (VIBRA-TABS) 100 MG tablet   Oral  Take 1 tablet (100 mg total) by mouth 2 (two) times daily.   20 tablet   0   . esomeprazole (NEXIUM) 40 MG capsule   Oral   Take 40 mg by mouth daily.          . fluticasone (FLONASE) 50 MCG/ACT nasal spray   Each Nare   Place 2 sprays into both nostrils daily as needed for rhinitis.          Marland Kitchen. lisinopril (PRINIVIL,ZESTRIL) 10 MG tablet   Oral   Take 10 mg by mouth daily.         . magic mouthwash w/lidocaine SOLN   Oral   Take 5 mLs by mouth 4 (four) times daily.   100 mL   0   . Oxycodone HCl 10 MG TABS   Oral   Take 10 mg by mouth 3 (three) times daily as needed (for pain).         . traMADol (ULTRAM) 50 MG tablet   Oral   Take 1 tablet (50 mg total) by mouth 2 (two) times daily.   10 tablet   0   . zolpidem (AMBIEN) 10 MG tablet   Oral   Take 10 mg by mouth at bedtime as needed  for sleep.          Allergies Sulfa antibiotics  No family history on file.  Social History Social History  Substance Use Topics  . Smoking status: Former Games developer  . Smokeless tobacco: None  . Alcohol Use: No   Review of Systems  Constitutional: Negative for fever. Eyes: Negative for visual changes. ENT: Negative for sore throat. Lower jaw swelling as above. Cardiovascular: Negative for chest pain. Respiratory: Negative for shortness of breath. Gastrointestinal: Negative for abdominal pain, vomiting and diarrhea. Genitourinary: Negative for dysuria. Musculoskeletal: Negative for back pain. Skin: Negative for rash. Neurological: Negative for headaches, focal weakness or numbness. ____________________________________________  PHYSICAL EXAM:  VITAL SIGNS: ED Triage Vitals  Enc Vitals Group     BP 08/30/15 0719 122/59 mmHg     Pulse Rate 08/30/15 0719 101     Resp 08/30/15 0719 18     Temp 08/30/15 0719 98.3 F (36.8 C)     Temp Source 08/30/15 0719 Oral     SpO2 08/30/15 0719 97 %     Weight 08/30/15 0719 200 lb (90.719 kg)     Height 08/30/15 0719   (1.575 m)     Head Cir --      Peak Flow --      Pain Score 08/30/15 0733 9     Pain Loc --      Pain Edu? --      Excl. in GC? --    Constitutional: Alert and oriented. Well appearing and in no distress. Head: Normocephalic and atraumatic.      Eyes: Conjunctivae are normal. PERRL. Normal extraocular movements      Ears: Canals clear. TMs intact bilaterally.   Nose: No congestion/rhinorrhea.   Mouth/Throat: Mucous membranes are moist. Lower jaw with left-sided buccal cellulitis & abscess. No focal gum erythema, edema, or pointing abscess. Good dentition to the remaining mandibular teeth.    Neck: Supple. No thyromegaly. Hematological/Lymphatic/Immunological: No cervical lymphadenopathy. Cardiovascular: Normal rate, regular rhythm.  Respiratory: Normal respiratory effort. No wheezes/rales/rhonchi. Gastrointestinal: Soft and nontender. No distention. Musculoskeletal: Nontender with normal range of motion in all extremities.  Neurologic:  Normal gait without ataxia. Normal speech and language. No gross focal neurologic deficits are appreciated. Skin:  Skin is warm, dry and intact. No rash noted. Psychiatric: Mood and affect are normal. Patient exhibits appropriate insight and judgment. ____________________________________________  INITIAL IMPRESSION / ASSESSMENT AND PLAN / ED COURSE  Buccal abscess. Prescription Augmentin and Ultram provided.  Follow-up with PCP or dental provider as needed. Return to the ED for worsening symptoms.  ____________________________________________  FINAL CLINICAL IMPRESSION(S) / ED DIAGNOSES  Final diagnoses:  Abscess of buccal space of mouth      Lissa Hoard, PA-C 08/30/15 1630  Loleta Rose, MD 08/31/15 1353

## 2015-08-30 NOTE — ED Notes (Addendum)
For the past year she has been on atbx off and on for abscesses that are in different places. Had [facility]. States as soon as she finishes an atbx, it comes back.  Now she has an abscess in her mouth on the left side that has caused facial swelling to the left side. Was going to make an appt with MD tomorrow, but cannot wait.

## 2015-09-12 ENCOUNTER — Encounter: Payer: Self-pay | Admitting: Emergency Medicine

## 2015-09-12 ENCOUNTER — Emergency Department
Admission: EM | Admit: 2015-09-12 | Discharge: 2015-09-12 | Disposition: A | Discharge status: Home / Self Care | Payer: Medicare Other | Attending: Emergency Medicine | Admitting: Emergency Medicine

## 2015-09-12 ENCOUNTER — Emergency Department: Payer: Medicare Other

## 2015-09-12 DIAGNOSIS — S161XXA Strain of muscle, fascia and tendon at neck level, initial encounter: Secondary | ICD-10-CM | POA: Insufficient documentation

## 2015-09-12 DIAGNOSIS — S79912A Unspecified injury of left hip, initial encounter: Secondary | ICD-10-CM | POA: Insufficient documentation

## 2015-09-12 DIAGNOSIS — S39012A Strain of muscle, fascia and tendon of lower back, initial encounter: Secondary | ICD-10-CM | POA: Insufficient documentation

## 2015-09-12 DIAGNOSIS — Y998 Other external cause status: Secondary | ICD-10-CM | POA: Insufficient documentation

## 2015-09-12 DIAGNOSIS — S8992XA Unspecified injury of left lower leg, initial encounter: Secondary | ICD-10-CM | POA: Insufficient documentation

## 2015-09-12 DIAGNOSIS — T148 Other injury of unspecified body region: Secondary | ICD-10-CM | POA: Insufficient documentation

## 2015-09-12 DIAGNOSIS — Z87891 Personal history of nicotine dependence: Secondary | ICD-10-CM | POA: Insufficient documentation

## 2015-09-12 DIAGNOSIS — Y9389 Activity, other specified: Secondary | ICD-10-CM | POA: Diagnosis not present

## 2015-09-12 DIAGNOSIS — Z79899 Other long term (current) drug therapy: Secondary | ICD-10-CM | POA: Diagnosis not present

## 2015-09-12 DIAGNOSIS — S79911A Unspecified injury of right hip, initial encounter: Secondary | ICD-10-CM | POA: Diagnosis not present

## 2015-09-12 DIAGNOSIS — I10 Essential (primary) hypertension: Secondary | ICD-10-CM | POA: Insufficient documentation

## 2015-09-12 DIAGNOSIS — Y92410 Unspecified street and highway as the place of occurrence of the external cause: Secondary | ICD-10-CM | POA: Insufficient documentation

## 2015-09-12 DIAGNOSIS — S0990XA Unspecified injury of head, initial encounter: Secondary | ICD-10-CM | POA: Diagnosis not present

## 2015-09-12 DIAGNOSIS — S299XXA Unspecified injury of thorax, initial encounter: Secondary | ICD-10-CM | POA: Diagnosis not present

## 2015-09-12 DIAGNOSIS — S8991XA Unspecified injury of right lower leg, initial encounter: Secondary | ICD-10-CM | POA: Diagnosis not present

## 2015-09-12 DIAGNOSIS — Z792 Long term (current) use of antibiotics: Secondary | ICD-10-CM | POA: Diagnosis not present

## 2015-09-12 DIAGNOSIS — T148XXA Other injury of unspecified body region, initial encounter: Secondary | ICD-10-CM

## 2015-09-12 DIAGNOSIS — S199XXA Unspecified injury of neck, initial encounter: Secondary | ICD-10-CM | POA: Diagnosis present

## 2015-09-12 MED ORDER — CYCLOBENZAPRINE HCL 10 MG PO TABS: 10.0000 mg | ORAL_TABLET | Freq: Three times a day (TID) | ORAL | Status: DC | PRN

## 2015-09-12 MED ORDER — HYDROCODONE-ACETAMINOPHEN 5-325 MG PO TABS: 1.0000 | ORAL_TABLET | ORAL | Status: DC | PRN

## 2015-09-12 MED ORDER — HYDROCODONE-ACETAMINOPHEN 5-325 MG PO TABS
1.0000 | ORAL_TABLET | Freq: Once | ORAL | Status: AC
  Administered 2015-09-12: 1 via ORAL
  Filled 2015-09-12: qty 1

## 2015-09-12 MED ORDER — MORPHINE SULFATE (PF) 4 MG/ML IV SOLN
4.0000 mg | Freq: Once | INTRAVENOUS | Status: AC
Start: 2015-09-12 — End: 2015-09-12
  Administered 2015-09-12: 4 mg via INTRAMUSCULAR
  Filled 2015-09-12: qty 1

## 2015-09-12 MED ORDER — ONDANSETRON 4 MG PO TBDP
4.0000 mg | ORAL_TABLET | Freq: Once | ORAL | Status: AC
  Administered 2015-09-12: 4 mg via ORAL
  Filled 2015-09-12: qty 1

## 2015-09-12 MED ORDER — IBUPROFEN 800 MG PO TABS: 800.0000 mg | ORAL_TABLET | Freq: Three times a day (TID) | ORAL | Status: DC | PRN

## 2015-09-12 MED ORDER — IBUPROFEN 800 MG PO TABS
800.0000 mg | ORAL_TABLET | Freq: Once | ORAL | Status: AC
  Administered 2015-09-12: 800 mg via ORAL
  Filled 2015-09-12: qty 1

## 2015-09-12 MED ORDER — OXYCODONE-ACETAMINOPHEN 5-325 MG PO TABS: 1.0000 | ORAL_TABLET | Freq: Four times a day (QID) | ORAL | Status: DC | PRN
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
## 2015-09-12 NOTE — Discharge Instructions (Signed)
Cervical Sprain A cervical sprain is when the tissues (ligaments) that hold the neck bones in place stretch or tear. HOME CARE   Put ice on the injured area.  Put ice in a plastic bag.  Place a towel between your skin and the bag.  Leave the ice on for 15-20 minutes, 3-4 times a day.  You may have been given a collar to wear. This collar keeps your neck from moving while you heal.  Do not take the collar off unless told by your doctor.  If you have long hair, keep it outside of the collar.  Ask your doctor before changing the position of your collar. You may need to change its position over time to make it more comfortable.  If you are allowed to take off the collar for cleaning or bathing, follow your doctor's instructions on how to do it safely.  Keep your collar clean by wiping it with mild soap and water. Dry it completely. If the collar has removable pads, remove them every 1-2 days to hand wash them with soap and water. Allow them to air dry. They should be dry before you wear them in the collar.  Do not drive while wearing the collar.  Only take medicine as told by your doctor.  Keep all doctor visits as told.  Keep all physical therapy visits as told.  Adjust your work station so that you have good posture while you work.  Avoid positions and activities that make your problems worse.  Warm up and stretch before being active. GET HELP IF:  Your pain is not controlled with medicine.  You cannot take less pain medicine over time as planned.  Your activity level does not improve as expected. GET HELP RIGHT AWAY IF:   You are bleeding.  Your stomach is upset.  You have an allergic reaction to your medicine.  You develop new problems that you cannot explain.  You lose feeling (become numb) or you cannot move any part of your body (paralysis).  You have tingling or weakness in any part of your body.  Your symptoms get worse. Symptoms include:  Pain,  soreness, stiffness, puffiness (swelling), or a burning feeling in your neck.  Pain when your neck is touched.  Shoulder or upper back pain.  Limited ability to move your neck.  Headache.  Dizziness.  Your hands or arms feel week, lose feeling, or tingle.  Muscle spasms.  Difficulty swallowing or chewing. MAKE SURE YOU:   Understand these instructions.  Will watch your condition.  Will get help right away if you are not doing well or get worse.   This information is not intended to replace advice given to you by your health care provider. Make sure you discuss any questions you have with your health care provider.   Document Released: 04/18/2008 Document Revised: 07/03/2013 Document Reviewed: 05/08/2013 [employer] Interactive Patient Education 2016 [employer]  Low Back Strain With Rehab A strain is an injury in which a tendon or muscle is torn. The muscles and tendons of the lower back are vulnerable to strains. However, these muscles and tendons are very strong and require a great force to be injured. Strains are classified into three categories. Grade 1 strains cause pain, but the tendon is not lengthened. Grade 2 strains include a lengthened ligament, due to the ligament being stretched or partially ruptured. With grade 2 strains there is still function, although the function may be decreased. Grade 3 strains involve a complete  tear of the tendon or muscle, and function is usually impaired. SYMPTOMS   Pain in the lower back.  Pain that affects one side more than the other.  Pain that gets worse with movement and may be felt in the hip, buttocks, or back of the thigh.  Muscle spasms of the muscles in the back.  Swelling along the muscles of the back.  Loss of strength of the back muscles.  Crackling sound (crepitation) when the muscles are touched. CAUSES  Lower back strains occur when a force is placed on the muscles or tendons that is greater than they can  handle. Common causes of injury include:  Prolonged overuse of the muscle-tendon units in the lower back, usually from incorrect posture.  A single violent injury or force applied to the back. RISK INCREASES WITH:  Sports that involve twisting forces on the spine or a lot of bending at the waist (football, rugby, weightlifting, bowling, golf, tennis, speed skating, racquetball, swimming, running, gymnastics, diving).  Poor strength and flexibility.  Failure to warm up properly before activity.  Family history of lower back pain or disk disorders.  Previous back injury or surgery (especially fusion).  Poor posture with lifting, especially heavy objects.  Prolonged sitting, especially with poor posture. PREVENTION   Learn and use proper posture when sitting or lifting (maintain proper posture when sitting, lift using the knees and legs, not at the waist).  Warm up and stretch properly before activity.  Allow for adequate recovery between workouts.  Maintain physical fitness:  Strength, flexibility, and endurance.  Cardiovascular fitness. PROGNOSIS  If treated properly, lower back strains usually heal within 6 weeks. RELATED COMPLICATIONS   Recurring symptoms, resulting in a chronic problem.  Chronic inflammation, scarring, and partial muscle-tendon tear.  Delayed healing or resolution of symptoms.  Prolonged disability. TREATMENT  Treatment first involves the use of ice and medicine, to reduce pain and inflammation. The use of strengthening and stretching exercises may help reduce pain with activity. These exercises may be performed at home or with a therapist. Severe injuries may require referral to a therapist for further evaluation and treatment, such as ultrasound. Your caregiver may advise that you wear a back brace or corset, to help reduce pain and discomfort. Often, prolonged bed rest results in greater harm then benefit. Corticosteroid injections may be  recommended. However, these should be reserved for the most serious cases. It is important to avoid using your back when lifting objects. At night, sleep on your back on a firm mattress with a pillow placed under your knees. If non-surgical treatment is unsuccessful, surgery may be needed.  MEDICATION   If pain medicine is needed, nonsteroidal anti-inflammatory medicines (aspirin and ibuprofen), or other minor pain relievers (acetaminophen), are often advised.  Do not take pain medicine for 7 days before surgery.  Prescription pain relievers may be given, if your caregiver thinks they are needed. Use only as directed and only as much as you need.  Ointments applied to the skin may be helpful.  Corticosteroid injections may be given by your caregiver. These injections should be reserved for the most serious cases, because they may only be given a certain number of times. HEAT AND COLD  Cold treatment (icing) should be applied for 10 to 15 minutes every 2 to 3 hours for inflammation and pain, and immediately after activity that aggravates your symptoms. Use ice packs or an ice massage.  Heat treatment may be used before performing stretching and strengthening activities  prescribed by your caregiver, physical therapist, or athletic trainer. Use a heat pack or a warm water soak. SEEK MEDICAL CARE IF:   Symptoms get worse or do not improve in 2 to 4 weeks, despite treatment.  You develop numbness, weakness, or loss of bowel or bladder function.  New, unexplained symptoms develop. (Drugs used in treatment may produce side effects.) EXERCISES  RANGE OF MOTION (ROM) AND STRETCHING EXERCISES - Low Back Strain Most people with lower back pain will find that their symptoms get worse with excessive bending forward (flexion) or arching at the lower back (extension). The exercises which will help resolve your symptoms will focus on the opposite motion.  Your physician, physical therapist or athletic  trainer will help you determine which exercises will be most helpful to resolve your lower back pain. Do not complete any exercises without first consulting with your caregiver. Discontinue any exercises which make your symptoms worse until you speak to your caregiver.  If you have pain, numbness or tingling which travels down into your buttocks, leg or foot, the goal of the therapy is for these symptoms to move closer to your back and eventually resolve. Sometimes, these leg symptoms will get better, but your lower back pain may worsen. This is typically an indication of progress in your rehabilitation. Be very alert to any changes in your symptoms and the activities in which you participated in the 24 hours prior to the change. Sharing this information with your caregiver will allow him/her to most efficiently treat your condition.  These exercises may help you when beginning to rehabilitate your injury. Your symptoms may resolve with or without further involvement from your physician, physical therapist or athletic trainer. While completing these exercises, remember:  Restoring tissue flexibility helps normal motion to return to the joints. This allows healthier, less painful movement and activity.  An effective stretch should be held for at least 30 seconds.  A stretch should never be painful. You should only feel a gentle lengthening or release in the stretched tissue. FLEXION RANGE OF MOTION AND STRETCHING EXERCISES: STRETCH - Flexion, Single Knee to Chest   Lie on a firm bed or floor with both legs extended in front of you.  Keeping one leg in contact with the floor, bring your opposite knee to your chest. Hold your leg in place by either grabbing behind your thigh or at your knee.  Pull until you feel a gentle stretch in your lower back. Hold __________ seconds.  Slowly release your grasp and repeat the exercise with the opposite side. Repeat __________ times. Complete this exercise  __________ times per day.  STRETCH - Flexion, Double Knee to Chest   Lie on a firm bed or floor with both legs extended in front of you.  Keeping one leg in contact with the floor, bring your opposite knee to your chest.  Tense your stomach muscles to support your back and then lift your other knee to your chest. Hold your legs in place by either grabbing behind your thighs or at your knees.  Pull both knees toward your chest until you feel a gentle stretch in your lower back. Hold __________ seconds.  Tense your stomach muscles and slowly return one leg at a time to the floor. Repeat __________ times. Complete this exercise __________ times per day.  STRETCH - Low Trunk Rotation  Lie on a firm bed or floor. Keeping your legs in front of you, bend your knees so they are both pointed toward  the ceiling and your feet are flat on the floor.  Extend your arms out to the side. This will stabilize your upper body by keeping your shoulders in contact with the floor.  Gently and slowly drop both knees together to one side until you feel a gentle stretch in your lower back. Hold for __________ seconds.  Tense your stomach muscles to support your lower back as you bring your knees back to the starting position. Repeat the exercise to the other side. Repeat __________ times. Complete this exercise __________ times per day  EXTENSION RANGE OF MOTION AND FLEXIBILITY EXERCISES: STRETCH - Extension, Prone on Elbows   Lie on your stomach on the floor, a bed will be too soft. Place your palms about shoulder width apart and at the height of your head.  Place your elbows under your shoulders. If this is too painful, stack pillows under your chest.  Allow your body to relax so that your hips drop lower and make contact more completely with the floor.  Hold this position for __________ seconds.  Slowly return to lying flat on the floor. Repeat __________ times. Complete this exercise __________ times  per day.  RANGE OF MOTION - Extension, Prone Press Ups  Lie on your stomach on the floor, a bed will be too soft. Place your palms about shoulder width apart and at the height of your head.  Keeping your back as relaxed as possible, slowly straighten your elbows while keeping your hips on the floor. You may adjust the placement of your hands to maximize your comfort. As you gain motion, your hands will come more underneath your shoulders.  Hold this position __________ seconds.  Slowly return to lying flat on the floor. Repeat __________ times. Complete this exercise __________ times per day.  RANGE OF MOTION- Quadruped, Neutral Spine   Assume a hands and knees position on a firm surface. Keep your hands under your shoulders and your knees under your hips. You may place padding under your knees for comfort.  Drop your head and point your tail bone toward the ground below you. This will round out your lower back like an angry cat. Hold this position for __________ seconds.  Slowly lift your head and release your tail bone so that your back sags into a large arch, like an old horse.  Hold this position for __________ seconds.  Repeat this until you feel limber in your lower back.  Now, find your "sweet spot." This will be the most comfortable position somewhere between the two previous positions. This is your neutral spine. Once you have found this position, tense your stomach muscles to support your lower back.  Hold this position for __________ seconds. Repeat __________ times. Complete this exercise __________ times per day.  STRENGTHENING EXERCISES - Low Back Strain These exercises may help you when beginning to rehabilitate your injury. These exercises should be done near your "sweet spot." This is the neutral, low-back arch, somewhere between fully rounded and fully arched, that is your least painful position. When performed in this safe range of motion, these exercises can be used  for people who have either a flexion or extension based injury. These exercises may resolve your symptoms with or without further involvement from your physician, physical therapist or athletic trainer. While completing these exercises, remember:   Muscles can gain both the endurance and the strength needed for everyday activities through controlled exercises.  Complete these exercises as instructed by your physician, physical therapist or  Product/process development scientist. Increase the resistance and repetitions only as guided.  You may experience muscle soreness or fatigue, but the pain or discomfort you are trying to eliminate should never worsen during these exercises. If this pain does worsen, stop and make certain you are following the directions exactly. If the pain is still present after adjustments, discontinue the exercise until you can discuss the trouble with your caregiver. STRENGTHENING - Deep Abdominals, Pelvic Tilt  Lie on a firm bed or floor. Keeping your legs in front of you, bend your knees so they are both pointed toward the ceiling and your feet are flat on the floor.  Tense your lower abdominal muscles to press your lower back into the floor. This motion will rotate your pelvis so that your tail bone is scooping upwards rather than pointing at your feet or into the floor.  With a gentle tension and even breathing, hold this position for __________ seconds. Repeat __________ times. Complete this exercise __________ times per day.  STRENGTHENING - Abdominals, Crunches   Lie on a firm bed or floor. Keeping your legs in front of you, bend your knees so they are both pointed toward the ceiling and your feet are flat on the floor. Cross your arms over your chest.  Slightly tip your chin down without bending your neck.  Tense your abdominals and slowly lift your trunk high enough to just clear your shoulder blades. Lifting higher can put excessive stress on the lower back and does not further  strengthen your abdominal muscles.  Control your return to the starting position. Repeat __________ times. Complete this exercise __________ times per day.  STRENGTHENING - Quadruped, Opposite UE/LE Lift   Assume a hands and knees position on a firm surface. Keep your hands under your shoulders and your knees under your hips. You may place padding under your knees for comfort.  Find your neutral spine and gently tense your abdominal muscles so that you can maintain this position. Your shoulders and hips should form a rectangle that is parallel with the floor and is not twisted.  Keeping your trunk steady, lift your right hand no higher than your shoulder and then your left leg no higher than your hip. Make sure you are not holding your breath. Hold this position __________ seconds.  Continuing to keep your abdominal muscles tense and your back steady, slowly return to your starting position. Repeat with the opposite arm and leg. Repeat __________ times. Complete this exercise __________ times per day.  STRENGTHENING - Lower Abdominals, Double Knee Lift  Lie on a firm bed or floor. Keeping your legs in front of you, bend your knees so they are both pointed toward the ceiling and your feet are flat on the floor.  Tense your abdominal muscles to brace your lower back and slowly lift both of your knees until they come over your hips. Be certain not to hold your breath.  Hold __________ seconds. Using your abdominal muscles, return to the starting position in a slow and controlled manner. Repeat __________ times. Complete this exercise __________ times per day.  POSTURE AND BODY MECHANICS CONSIDERATIONS - Low Back Strain Keeping correct posture when sitting, standing or completing your activities will reduce the stress put on different body tissues, allowing injured tissues a chance to heal and limiting painful experiences. The following are general guidelines for improved posture. Your physician  or physical therapist will provide you with any instructions specific to your needs. While reading these guidelines, remember:  The exercises prescribed by your provider will help you have the flexibility and strength to maintain correct postures.  The correct posture provides the best environment for your joints to work. All of your joints have less wear and tear when properly supported by a spine with good posture. This means you will experience a healthier, less painful body.  Correct posture must be practiced with all of your activities, especially prolonged sitting and standing. Correct posture is as important when doing repetitive low-stress activities (typing) as it is when doing a single heavy-load activity (lifting). RESTING POSITIONS Consider which positions are most painful for you when choosing a resting position. If you have pain with flexion-based activities (sitting, bending, stooping, squatting), choose a position that allows you to rest in a less flexed posture. You would want to avoid curling into a fetal position on your side. If your pain worsens with extension-based activities (prolonged standing, working overhead), avoid resting in an extended position such as sleeping on your stomach. Most people will find more comfort when they rest with their spine in a more neutral position, neither too rounded nor too arched. Lying on a non-sagging bed on your side with a pillow between your knees, or on your back with a pillow under your knees will often provide some relief. Keep in mind, being in any one position for a prolonged period of time, no matter how correct your posture, can still lead to stiffness. PROPER SITTING POSTURE In order to minimize stress and discomfort on your spine, you must sit with correct posture. Sitting with good posture should be effortless for a healthy body. Returning to good posture is a gradual process. Many people can work toward this most comfortably by using  various supports until they have the flexibility and strength to maintain this posture on their own. When sitting with proper posture, your ears will fall over your shoulders and your shoulders will fall over your hips. You should use the back of the chair to support your upper back. Your lower back will be in a neutral position, just slightly arched. You may place a small pillow or folded towel at the base of your lower back for support.  When working at a desk, create an environment that supports good, upright posture. Without extra support, muscles tire, which leads to excessive strain on joints and other tissues. Keep these recommendations in mind: CHAIR:  A chair should be able to slide under your desk when your back makes contact with the back of the chair. This allows you to work closely.  The chair's height should allow your eyes to be level with the upper part of your monitor and your hands to be slightly lower than your elbows. BODY POSITION  Your feet should make contact with the floor. If this is not possible, use a foot rest.  Keep your ears over your shoulders. This will reduce stress on your neck and lower back. INCORRECT SITTING POSTURES  If you are feeling tired and unable to assume a healthy sitting posture, do not slouch or slump. This puts excessive strain on your back tissues, causing more damage and pain. Healthier options include:  Using more support, like a lumbar pillow.  Switching tasks to something that requires you to be upright or walking.  Talking a brief walk.  Lying down to rest in a neutral-spine position. PROLONGED STANDING WHILE SLIGHTLY LEANING FORWARD  When completing a task that requires you to lean forward while standing in one place for  a long time, place either foot up on a stationary 2-4 inch high object to help maintain the best posture. When both feet are on the ground, the lower back tends to lose its slight inward curve. If this curve flattens (or  becomes too large), then the back and your other joints will experience too much stress, tire more quickly, and can cause pain. CORRECT STANDING POSTURES Proper standing posture should be assumed with all daily activities, even if they only take a few moments, like when brushing your teeth. As in sitting, your ears should fall over your shoulders and your shoulders should fall over your hips. You should keep a slight tension in your abdominal muscles to brace your spine. Your tailbone should point down to the ground, not behind your body, resulting in an over-extended swayback posture.  INCORRECT STANDING POSTURES  Common incorrect standing postures include a forward head, locked knees and/or an excessive swayback. WALKING Walk with an upright posture. Your ears, shoulders and hips should all line-up. PROLONGED ACTIVITY IN A FLEXED POSITION When completing a task that requires you to bend forward at your waist or lean over a low surface, try to find a way to stabilize 3 out of 4 of your limbs. You can place a hand or elbow on your thigh or rest a knee on the surface you are reaching across. This will provide you more stability so that your muscles do not fatigue as quickly. By keeping your knees relaxed, or slightly bent, you will also reduce stress across your lower back. CORRECT LIFTING TECHNIQUES DO :   Assume a wide stance. This will provide you more stability and the opportunity to get as close as possible to the object which you are lifting.  Tense your abdominals to brace your spine. Bend at the knees and hips. Keeping your back locked in a neutral-spine position, lift using your leg muscles. Lift with your legs, keeping your back straight.  Test the weight of unknown objects before attempting to lift them.  Try to keep your elbows locked down at your sides in order get the best strength from your shoulders when carrying an object.  Always ask for help when lifting heavy or awkward  objects. INCORRECT LIFTING TECHNIQUES DO NOT:   Lock your knees when lifting, even if it is a small object.  Bend and twist. Pivot at your feet or move your feet when needing to change directions.  Assume that you can safely pick up even a paper clip without proper posture.   This information is not intended to replace advice given to you by your health care provider. Make sure you discuss any questions you have with your health care provider.   Document Released: 10/31/2005 Document Revised: 11/21/2014 Document Reviewed: 02/12/2009 Else[employer] Interactive Patient Education 2016 [employer]   Continue pain medicine as needed. Contact the orthopedist or your primary physician if not improving. Return to the emergency room for any concerns.

## 2015-09-20 ENCOUNTER — Emergency Department: Payer: Medicare Other

## 2015-09-20 ENCOUNTER — Encounter: Payer: Self-pay | Admitting: Emergency Medicine

## 2015-09-20 ENCOUNTER — Emergency Department
Admission: EM | Admit: 2015-09-20 | Discharge: 2015-09-20 | Disposition: A | Discharge status: Home / Self Care | Payer: Medicare Other | Attending: Emergency Medicine | Admitting: Emergency Medicine

## 2015-09-20 DIAGNOSIS — Z792 Long term (current) use of antibiotics: Secondary | ICD-10-CM | POA: Diagnosis not present

## 2015-09-20 DIAGNOSIS — Z87891 Personal history of nicotine dependence: Secondary | ICD-10-CM | POA: Insufficient documentation

## 2015-09-20 DIAGNOSIS — Z79899 Other long term (current) drug therapy: Secondary | ICD-10-CM | POA: Diagnosis not present

## 2015-09-20 DIAGNOSIS — J441 Chronic obstructive pulmonary disease with (acute) exacerbation: Secondary | ICD-10-CM | POA: Diagnosis not present

## 2015-09-20 DIAGNOSIS — I10 Essential (primary) hypertension: Secondary | ICD-10-CM | POA: Diagnosis not present

## 2015-09-20 DIAGNOSIS — R0602 Shortness of breath: Secondary | ICD-10-CM | POA: Diagnosis present

## 2015-09-20 DIAGNOSIS — R061 Stridor: Secondary | ICD-10-CM

## 2015-09-20 DIAGNOSIS — J069 Acute upper respiratory infection, unspecified: Secondary | ICD-10-CM | POA: Diagnosis not present

## 2015-09-20 DIAGNOSIS — R109 Unspecified abdominal pain: Secondary | ICD-10-CM | POA: Insufficient documentation

## 2015-09-20 LAB — CBC WITH DIFFERENTIAL/PLATELET
BASOS ABS: 0 10*3/uL (ref 0–0.1)
Basophils Relative: 1 %
EOS ABS: 0.2 10*3/uL (ref 0–0.7)
EOS PCT: 4 %
HCT: 37.5 % (ref 35.0–47.0)
Hemoglobin: 13.1 g/dL (ref 12.0–16.0)
LYMPHS PCT: 31 %
Lymphs Abs: 1.9 10*3/uL (ref 1.0–3.6)
MCH: 31.1 pg (ref 26.0–34.0)
MCHC: 34.9 g/dL (ref 32.0–36.0)
MCV: 89.1 fL (ref 80.0–100.0)
MONO ABS: 0.5 10*3/uL (ref 0.2–0.9)
Monocytes Relative: 9 %
Neutro Abs: 3.3 10*3/uL (ref 1.4–6.5)
Neutrophils Relative %: 55 %
PLATELETS: 282 10*3/uL (ref 150–440)
RBC: 4.22 MIL/uL (ref 3.80–5.20)
RDW: 13.7 % (ref 11.5–14.5)
WBC: 6 10*3/uL (ref 3.6–11.0)

## 2015-09-20 LAB — COMPREHENSIVE METABOLIC PANEL
ALT: 106 U/L — AB (ref 14–54)
AST: 103 U/L — AB (ref 15–41)
Albumin: 4 g/dL (ref 3.5–5.0)
Alkaline Phosphatase: 92 U/L (ref 38–126)
Anion gap: 6 (ref 5–15)
BUN: 14 mg/dL (ref 6–20)
CHLORIDE: 103 mmol/L (ref 101–111)
CO2: 28 mmol/L (ref 22–32)
Calcium: 9.2 mg/dL (ref 8.9–10.3)
Creatinine, Ser: 1.1 mg/dL — ABNORMAL HIGH (ref 0.44–1.00)
GFR, EST NON AFRICAN AMERICAN: 57 mL/min — AB (ref >60)
Glucose, Bld: 114 mg/dL — ABNORMAL HIGH (ref 65–99)
POTASSIUM: 4.3 mmol/L (ref 3.5–5.1)
SODIUM: 137 mmol/L (ref 135–145)
Total Bilirubin: 0.4 mg/dL (ref 0.3–1.2)
Total Protein: 6.9 g/dL (ref 6.5–8.1)

## 2015-09-20 LAB — TROPONIN I

## 2015-09-20 LAB — LIPASE, BLOOD: LIPASE: 22 U/L (ref 11–51)

## 2015-09-20 MED ORDER — PREDNISONE 20 MG PO TABS: 60.0000 mg | ORAL_TABLET | Freq: Every day | ORAL | Status: DC

## 2015-09-20 MED ORDER — IPRATROPIUM-ALBUTEROL 0.5-2.5 (3) MG/3ML IN SOLN
3.0000 mL | Freq: Once | RESPIRATORY_TRACT | Status: AC
  Administered 2015-09-20: 3 mL via RESPIRATORY_TRACT
  Filled 2015-09-20: qty 3

## 2015-09-20 MED ORDER — PREDNISONE 20 MG PO TABS
60.0000 mg | ORAL_TABLET | Freq: Once | ORAL | Status: AC
  Administered 2015-09-20: 60 mg via ORAL
  Filled 2015-09-20: qty 3

## 2015-09-20 MED ORDER — IPRATROPIUM-ALBUTEROL 0.5-2.5 (3) MG/3ML IN SOLN
9.0000 mL | Freq: Once | RESPIRATORY_TRACT | Status: AC
  Administered 2015-09-20: 6 mL via RESPIRATORY_TRACT
  Filled 2015-09-20: qty 9

## 2015-09-20 NOTE — ED Provider Notes (Addendum)
[facility] Emergency Department Provider Note  ____________________________________________  Time seen: Approximately 1230 PM  I have reviewed the triage vital signs and the nursing notes.   HISTORY  Chief Complaint Shortness of Breath    HPI Rebecca Ramos is a 54 y.o. female with a history of hypertension and COPD who is presenting today with wheezing over the last 3 days. She says she has had some cough and nausea associated. No fevers.Also complaining of runny nose and left ear pressure. Denies any known sick contacts. Denies any vomiting but says she has had some nausea. Denies any diarrhea. Actually says that she has had no bowel movements over the past 4 days and has had some diffuse abdominal cramping because of this. She's had constipation in the past with a similar cramping abdominal pain. Says that also her belly and chest hurt when she coughs.   Past Medical History  Diagnosis Date  . Hypertension   . Acid reflux   . HTN (hypertension) 03/20/2015    Patient Active Problem List   Diagnosis Date Noted  . Cellulitis 03/20/2015  . HTN (hypertension) 03/20/2015    Past Surgical History  Procedure Laterality Date  . Abdominal hysterectomy    . Cesarean section Bilateral   . Tonsillectomy    . Cesarean section Bilateral   . Elbow surgery      bilateral  . Thumb surgery      right     Current Outpatient Rx  Name  Route  Sig  Dispense  Refill  . alprazolam (XANAX) 2 MG tablet   Oral   Take 1 mg by mouth 4 (four) times daily.          Marland Kitchen amLODipine (NORVASC) 10 MG tablet   Oral   Take 10 mg by mouth daily.         Marland Kitchen amoxicillin-clavulanate (AUGMENTIN) 875-125 MG tablet   Oral   Take 1 tablet by mouth 2 (two) times daily.   20 tablet   0   . cyclobenzaprine (FLEXERIL) 10 MG tablet   Oral   Take 1 tablet (10 mg total) by mouth every 8 (eight) hours as needed for muscle spasms.   21 tablet   0   . cyclobenzaprine (FLEXERIL) 10  MG tablet   Oral   Take 1 tablet (10 mg total) by mouth every 8 (eight) hours as needed for muscle spasms.   21 tablet   0   . doxycycline (VIBRA-TABS) 100 MG tablet   Oral   Take 1 tablet (100 mg total) by mouth 2 (two) times daily.   20 tablet   0   . esomeprazole (NEXIUM) 40 MG capsule   Oral   Take 40 mg by mouth daily.          . fluticasone (FLONASE) 50 MCG/ACT nasal spray   Each Nare   Place 2 sprays into both nostrils daily as needed for rhinitis.          Marland Kitchen HYDROcodone-acetaminophen (NORCO) 5-325 MG tablet   Oral   Take 1 tablet by mouth every 4 (four) hours as needed for moderate pain.   20 tablet   0   . ibuprofen (ADVIL,MOTRIN) 800 MG tablet   Oral   Take 1 tablet (800 mg total) by mouth every 8 (eight) hours as needed.   15 tablet   0   . lisinopril (PRINIVIL,ZESTRIL) 10 MG tablet   Oral   Take 10 mg by mouth daily.         Marland Kitchen  magic mouthwash w/lidocaine SOLN   Oral   Take 5 mLs by mouth 4 (four) times daily.   100 mL   0   . Oxycodone HCl 10 MG TABS   Oral   Take 10 mg by mouth 3 (three) times daily as needed (for pain).         . traMADol (ULTRAM) 50 MG tablet   Oral   Take 1 tablet (50 mg total) by mouth 2 (two) times daily.   10 tablet   0   . zolpidem (AMBIEN) 10 MG tablet   Oral   Take 10 mg by mouth at bedtime as needed for sleep.           Allergies Sulfa antibiotics  No family history on file.  Social History Social History  Substance Use Topics  . Smoking status: Former Games developermoker  . Smokeless tobacco: None  . Alcohol Use: No    Review of Systems Constitutional: No fever/chills Eyes: No visual changes. ENT: No sore throat. Cardiovascular: As above  Respiratory: As above Gastrointestinal:  no vomiting.  No diarrhea.  Genitourinary: Negative for dysuria. Musculoskeletal: Negative for back pain. Skin: Negative for rash. Neurological: Negative for headaches, focal weakness or numbness.  10-point ROS otherwise  negative.  ____________________________________________   PHYSICAL EXAM:  VITAL SIGNS: ED Triage Vitals  Enc Vitals Group     BP 09/20/15 1056 126/82 mmHg     Pulse Rate 09/20/15 1056 75     Resp 09/20/15 1056 18     Temp 09/20/15 1056 98.2 F (36.8 C)     Temp Source 09/20/15 1056 Oral     SpO2 09/20/15 1056 95 %     Weight 09/20/15 1056 200 lb (90.719 kg)     Height 09/20/15 1056 5\' 2"  (1.575 m)     Head Cir --      Peak Flow --      Pain Score 09/20/15 1213 7     Pain Loc --      Pain Edu? --      Excl. in GC? --     Constitutional: Alert and oriented. Well appearing and in no acute distress. When the room there is no audible wheeze however once I started talking with the patient she appears to start straining at her throat to produce a wheezing noise.  Eyes: Conjunctivae are normal. PERRL. EOMI. Head: Atraumatic. Nose: No congestion/rhinnorhea. Mouth/Throat: Mucous membranes are moist.  Oropharynx non-erythematous. Neck: Mild expiratory stridor at the neck. It is unclear whether the patient is trying to provoke this. Cardiovascular: Normal rate, regular rhythm. Grossly normal heart sounds.  Good peripheral circulation. Respiratory: Normal respiratory effort.  No retractions. Lungs CTAB. Gastrointestinal: Soft with mild diffuse tenderness. No distention. No abdominal bruits. No CVA tenderness. Musculoskeletal: No lower extremity tenderness nor edema.  No joint effusions. Neurologic:  Normal speech and language. No gross focal neurologic deficits are appreciated. No gait instability. Skin:  Skin is warm, dry and intact. No rash noted. Psychiatric: Mood and affect are normal. Speech and behavior are normal.  ____________________________________________   LABS (all labs ordered are listed, but only abnormal results are displayed)  Labs Reviewed  COMPREHENSIVE METABOLIC PANEL - Abnormal; Notable for the following:    Glucose, Bld 114 (*)    Creatinine, Ser 1.10 (*)     AST 103 (*)    ALT 106 (*)    GFR calc non Af Amer 57 (*)    All other components within normal limits  CBC WITH DIFFERENTIAL/PLATELET  LIPASE, BLOOD  TROPONIN I   ____________________________________________  EKG  ED ECG REPORT I, Ashle Stief,  Teena Irani, the attending physician, personally viewed and interpreted this ECG.   Date: 09/20/2015  EKG Time: 1455  Rate: 70  Rhythm: normal sinus rhythm  Axis: Normal axis  Intervals:none  ST&T Change: No ST elevations or depressions. T-wave inversions in 3 and aVF which are unchanged from the EKG of 03/11/2015. Q waves in 1 and aVL which are unchanged from previous.  ____________________________________________  RADIOLOGY  No acute abnormality on the soft tissue neck as well as the chest x-ray.  ____________________________________________   PROCEDURES  ____________________________________________   INITIAL IMPRESSION / ASSESSMENT AND PLAN / ED COURSE  Pertinent labs & imaging results that were available during my care of the patient were reviewed by me and considered in my medical decision making (see chart for details).  ----------------------------------------- 1:12 PM on 09/20/2015 -----------------------------------------  Patient continues without arrest or distress. Again when her the room the patient is able to talk without any wheezing as a conversation goes on she begins to strain in her throat to produce this wheezing was again.  ----------------------------------------- 3:12 PM on 09/20/2015 -----------------------------------------  Patient is resting completely without any respiratory distress. She is able speak in full sentence. There are no retractions. There is no stridor or wheezing 1 the patient is talking. Also when she is listening to me there is no noise. However, when she gets admitted to rest there is a mild return of the wheezing sound. This appears to be coming from her neck. However, she had a  normal x-ray of her neck. She is able to swallow. She had a normal visualization of her pharynx. I gave her strict return precautions including any worsening of the wheezing over the course of the evening. She says that she does feel improved since she got her steroids and nebulizer treatments. She'll be discharged home. She knows to return if any of her symptoms are worsening or concerning. Also give her the follow-up information for ear nose and throat for follow-up.  ____________________________________________   FINAL CLINICAL IMPRESSION(S) / ED DIAGNOSES  Acute upper respiratory infection. Stridor.    Myrna Blazer, MD 09/20/15 1514  Furthermore, the patient thinks she may be having an allergic reaction to her Augmentin. However, she has been off the Augmentin for 2 days. She says she had been taking Augmentin for 10 days total. Less likely for allergic reaction as she has taken Augmentin multiple times before, as reported without any adverse effects.  Myrna Blazer, MD 09/20/15 [phone]

## 2015-09-20 NOTE — Discharge Instructions (Signed)
Upper Respiratory Infection, Adult Most upper respiratory infections (URIs) are a viral infection of the air passages leading to the lungs. A URI affects the nose, throat, and upper air passages. The most common type of URI is nasopharyngitis and is typically referred to as "the common cold." URIs run their course and usually go away on their own. Most of the time, a URI does not require medical attention, but sometimes a bacterial infection in the upper airways can follow a viral infection. This is called a secondary infection. Sinus and middle ear infections are common types of secondary upper respiratory infections. Bacterial pneumonia can also complicate a URI. A URI can worsen asthma and chronic obstructive pulmonary disease (COPD). Sometimes, these complications can require emergency medical care and may be life threatening.  CAUSES Almost all URIs are caused by viruses. A virus is a type of germ and can spread from one person to another.  RISKS FACTORS You may be at risk for a URI if:   You smoke.   You have chronic heart or lung disease.  You have a weakened defense (immune) system.   You are very young or very old.   You have nasal allergies or asthma.  You work in crowded or poorly ventilated areas.  You work in health care facilities or schools. SIGNS AND SYMPTOMS  Symptoms typically develop 2-3 days after you come in contact with a cold virus. Most viral URIs last 7-10 days. However, viral URIs from the influenza virus (flu virus) can last 14-18 days and are typically more severe. Symptoms may include:   Runny or stuffy (congested) nose.   Sneezing.   Cough.   Sore throat.   Headache.   Fatigue.   Fever.   Loss of appetite.   Pain in your forehead, behind your eyes, and over your cheekbones (sinus pain).  Muscle aches.  DIAGNOSIS  Your health care provider may diagnose a URI by:  Physical exam.  Tests to check that your symptoms are not due to  another condition such as:  Strep throat.  Sinusitis.  Pneumonia.  Asthma. TREATMENT  A URI goes away on its own with time. It cannot be cured with medicines, but medicines may be prescribed or recommended to relieve symptoms. Medicines may help:  Reduce your fever.  Reduce your cough.  Relieve nasal congestion. HOME CARE INSTRUCTIONS   Take medicines only as directed by your health care provider.   Gargle warm saltwater or take cough drops to comfort your throat as directed by your health care provider.  Use a warm mist humidifier or inhale steam from a shower to increase air moisture. This may make it easier to breathe.  Drink enough fluid to keep your urine clear or pale yellow.   Eat soups and other clear broths and maintain good nutrition.   Rest as needed.   Return to work when your temperature has returned to normal or as your health care provider advises. You may need to stay home longer to avoid infecting others. You can also use a face mask and careful hand washing to prevent spread of the virus.  Increase the usage of your inhaler if you have asthma.   Do not use any tobacco products, including cigarettes, chewing tobacco, or electronic cigarettes. If you need help quitting, ask your health care provider. PREVENTION  The best way to protect yourself from getting a cold is to practice good hygiene.   Avoid oral or hand contact with people with cold   symptoms.   Wash your hands often if contact occurs.  There is no clear evidence that vitamin C, vitamin E, echinacea, or exercise reduces the chance of developing a cold. However, it is always recommended to get plenty of rest, exercise, and practice good nutrition.  SEEK MEDICAL CARE IF:   You are getting worse rather than better.   Your symptoms are not controlled by medicine.   You have chills.  You have worsening shortness of breath.  You have brown or red mucus.  You have yellow or brown nasal  discharge.  You have pain in your face, especially when you bend forward.  You have a fever.  You have swollen neck glands.  You have pain while swallowing.  You have white areas in the back of your throat. SEEK IMMEDIATE MEDICAL CARE IF:   You have severe or persistent:  Headache.  Ear pain.  Sinus pain.  Chest pain.  You have chronic lung disease and any of the following:  Wheezing.  Prolonged cough.  Coughing up blood.  A change in your usual mucus.  You have a stiff neck.  You have changes in your:  Vision.  Hearing.  Thinking.  Mood. MAKE SURE YOU:   Understand these instructions.  Will watch your condition.  Will get help right away if you are not doing well or get worse.   This information is not intended to replace advice given to you by your health care provider. Make sure you discuss any questions you have with your health care provider.   Document Released: 04/26/2001 Document Revised: 03/17/2015 Document Reviewed: 02/05/2014 Elsevier Interactive Patient Education 2016 [employer]  

## 2015-09-20 NOTE — ED Notes (Signed)
Developed some wheezing and chest discomfort   ..audible wheezing noted at triage

## 2015-11-18 DIAGNOSIS — M545 Low back pain: Secondary | ICD-10-CM | POA: Diagnosis not present

## 2015-11-18 DIAGNOSIS — E559 Vitamin D deficiency, unspecified: Secondary | ICD-10-CM | POA: Diagnosis not present

## 2015-11-18 DIAGNOSIS — G8929 Other chronic pain: Secondary | ICD-10-CM | POA: Diagnosis not present

## 2015-11-18 DIAGNOSIS — M5030 Other cervical disc degeneration, unspecified cervical region: Secondary | ICD-10-CM | POA: Diagnosis not present

## 2015-11-18 DIAGNOSIS — M47816 Spondylosis without myelopathy or radiculopathy, lumbar region: Secondary | ICD-10-CM | POA: Diagnosis not present

## 2015-11-18 DIAGNOSIS — J4540 Moderate persistent asthma, uncomplicated: Secondary | ICD-10-CM | POA: Diagnosis not present

## 2015-12-11 ENCOUNTER — Emergency Department
Admission: EM | Admit: 2015-12-11 | Discharge: 2015-12-11 | Disposition: A | Discharge status: Home / Self Care | Payer: Medicare Other | Attending: Emergency Medicine | Admitting: Emergency Medicine

## 2015-12-11 ENCOUNTER — Encounter: Payer: Self-pay | Admitting: Emergency Medicine

## 2015-12-11 DIAGNOSIS — R6884 Jaw pain: Secondary | ICD-10-CM | POA: Diagnosis present

## 2015-12-11 DIAGNOSIS — I10 Essential (primary) hypertension: Secondary | ICD-10-CM | POA: Diagnosis not present

## 2015-12-11 DIAGNOSIS — Z87891 Personal history of nicotine dependence: Secondary | ICD-10-CM | POA: Insufficient documentation

## 2015-12-11 DIAGNOSIS — Z792 Long term (current) use of antibiotics: Secondary | ICD-10-CM | POA: Diagnosis not present

## 2015-12-11 DIAGNOSIS — Z79899 Other long term (current) drug therapy: Secondary | ICD-10-CM | POA: Insufficient documentation

## 2015-12-11 DIAGNOSIS — Z7952 Long term (current) use of systemic steroids: Secondary | ICD-10-CM | POA: Diagnosis not present

## 2015-12-11 DIAGNOSIS — K122 Cellulitis and abscess of mouth: Secondary | ICD-10-CM | POA: Diagnosis not present

## 2015-12-11 MED ORDER — AMOXICILLIN 500 MG PO TABS: 500.0000 mg | ORAL_TABLET | Freq: Three times a day (TID) | ORAL | Status: DC

## 2015-12-11 MED ORDER — IBUPROFEN 800 MG PO TABS: 800.0000 mg | ORAL_TABLET | Freq: Three times a day (TID) | ORAL | Status: DC | PRN

## 2015-12-11 NOTE — ED Notes (Signed)
Pt states she has had left jaw pain in mouth that has moved out through her ear and her throat.  Pain is tender to the touch on left side.

## 2015-12-11 NOTE — ED Provider Notes (Signed)
[facility] Emergency Department Provider Note  ____________________________________________  Time seen: Approximately 4:50 PM  I have reviewed the triage vital signs and the nursing notes.   HISTORY  Chief Complaint Facial Pain    HPI Rebecca Ramos is a 55 y.o. female , NAD. Presents to the emergency department with left lower jaw pain and mouth pain. A week ago and has progressively worsened over time. Difficult to eat due to pain. States left lower jaw is tender to touch, feels swollen. Also notes pain is beginning to feel like a sore throat. Denies any upper respiratory symptoms. No fevers, chills, nor body aches   Past Medical History  Diagnosis Date  . Hypertension   . Acid reflux   . HTN (hypertension) 03/20/2015    Patient Active Problem List   Diagnosis Date Noted  . Cellulitis 03/20/2015  . HTN (hypertension) 03/20/2015    Past Surgical History  Procedure Laterality Date  . Abdominal hysterectomy    . Cesarean section Bilateral   . Tonsillectomy    . Cesarean section Bilateral   . Elbow surgery      bilateral  . Thumb surgery      right     Current Outpatient Rx  Name  Route  Sig  Dispense  Refill  . alprazolam (XANAX) 2 MG tablet   Oral   Take 1 mg by mouth 4 (four) times daily.          Marland Kitchen amLODipine (NORVASC) 10 MG tablet   Oral   Take 10 mg by mouth daily.         Marland Kitchen amoxicillin (AMOXIL) 500 MG tablet   Oral   Take 1 tablet (500 mg total) by mouth 3 (three) times daily with meals.   30 tablet   0   . cyclobenzaprine (FLEXERIL) 10 MG tablet   Oral   Take 1 tablet (10 mg total) by mouth every 8 (eight) hours as needed for muscle spasms.   21 tablet   0   . cyclobenzaprine (FLEXERIL) 10 MG tablet   Oral   Take 1 tablet (10 mg total) by mouth every 8 (eight) hours as needed for muscle spasms.   21 tablet   0   . doxycycline (VIBRA-TABS) 100 MG tablet   Oral   Take 1 tablet (100 mg total) by mouth 2 (two) times  daily.   20 tablet   0   . esomeprazole (NEXIUM) 40 MG capsule   Oral   Take 40 mg by mouth daily.          . fluticasone (FLONASE) 50 MCG/ACT nasal spray   Each Nare   Place 2 sprays into both nostrils daily as needed for rhinitis.          Marland Kitchen HYDROcodone-acetaminophen (NORCO) 5-325 MG tablet   Oral   Take 1 tablet by mouth every 4 (four) hours as needed for moderate pain.   20 tablet   0   . ibuprofen (ADVIL,MOTRIN) 800 MG tablet   Oral   Take 1 tablet (800 mg total) by mouth every 8 (eight) hours as needed (pain).   60 tablet   0   . lisinopril (PRINIVIL,ZESTRIL) 10 MG tablet   Oral   Take 10 mg by mouth daily.         . magic mouthwash w/lidocaine SOLN   Oral   Take 5 mLs by mouth 4 (four) times daily.   100 mL   0   .  Oxycodone HCl 10 MG TABS   Oral   Take 10 mg by mouth 3 (three) times daily as needed (for pain).         . predniSONE (DELTASONE) 20 MG tablet   Oral   Take 3 tablets (60 mg total) by mouth daily with breakfast.   15 tablet   0   . traMADol (ULTRAM) 50 MG tablet   Oral   Take 1 tablet (50 mg total) by mouth 2 (two) times daily.   10 tablet   0   . zolpidem (AMBIEN) 10 MG tablet   Oral   Take 10 mg by mouth at bedtime as needed for sleep.           Allergies Sulfa antibiotics  History reviewed. No pertinent family history.  Social History Social History  Substance Use Topics  . Smoking status: Former Games developer  . Smokeless tobacco: None  . Alcohol Use: No     Review of Systems  Constitutional: No fever/chills. Mild decreased appetite.  Eyes: No visual changes. No discharge ENT: Sore throat. No sinus pressure, no nasal congestion.  Cardiovascular: No chest pain. Respiratory: No cough. No shortness of breath.  Gastrointestinal: Nausea but no vomiting. No abdominal pain.  Musculoskeletal: Left lower jaw pain Skin: Negative for rash. Some swelling and warmth to left lower jaw. Neurological: Negative for headaches,  focal weakness or numbness. 10-point ROS otherwise negative.  ____________________________________________   PHYSICAL EXAM:  VITAL SIGNS: ED Triage Vitals  Enc Vitals Group     BP --      Pulse --      Resp --      Temp --      Temp src --      SpO2 --      Weight --      Height --      Head Cir --      Peak Flow --      Pain Score --      Pain Loc --      Pain Edu? --      Excl. in GC? --     Constitutional: Alert and oriented. Well appearing and in no acute distress. Eyes: Conjunctivae are normal. PERRL. EOMI without pain.  Head: Atraumatic. ENT:      Ears:       Nose: No congestion/rhinnorhea.      Mouth/Throat: Mucous membranes are moist. No oral lesions. Left lower gum line with erythema and mild swelling. Tender to palpation. Poor dentition.  Neck: Supple with FROM. Hematological/Lymphatic/Immunilogical: No cervical lymphadenopathy. Cardiovascular: Normal rate, regular rhythm. Normal S1 and S2.  Good peripheral circulation. Respiratory: Normal respiratory effort without tachypnea or retractions. Lungs CTAB. Neurologic:  Normal speech and language. No gross focal neurologic deficits are appreciated.  Skin:  Skin is warm, dry and intact. No rash noted. Warmth noted about left lower jaw correlating with inflamed gum line.  Psychiatric: Mood and affect are normal. Speech and behavior are normal. Patient exhibits appropriate insight and judgement.   ____________________________________________   LABS (all labs ordered are listed, but only abnormal results are displayed)  Labs Reviewed - No data to display ____________________________________________  EKG  None ____________________________________________  RADIOLOGY  None  ____________________________________________    PROCEDURES  Procedure(s) performed: None    Medications - No data to display   ____________________________________________   INITIAL IMPRESSION / ASSESSMENT AND PLAN / ED  COURSE  Pertinent labs & imaging results that were available during my care  of the patient were reviewed by me and considered in my medical decision making (see chart for details).  Patient's diagnosis is consistent with abscess oral soft tissues related to dentition. Patient will be discharged home with prescriptions for amoxicillin to treat bacterial infection and ibuprofen to decrease inflammation and pain. Patient was queried in the [place] controlled substance database and has received 200 tablets Oxycodone  from her PCP since 11/03/2015. If patient needs anything further for pain relief, she should consult with her PCP. Patient is to follow up with her personal dentist or PCP if symptoms persist past this treatment course. Patient is given ED precautions to return to the ED for any worsening or new symptoms.      ____________________________________________  FINAL CLINICAL IMPRESSION(S) / ED DIAGNOSES  Final diagnoses:  Abscess of oral tissue      NEW MEDICATIONS STARTED DURING THIS VISIT:  New Prescriptions   AMOXICILLIN (AMOXIL) 500 MG TABLET    Take 1 tablet (500 mg total) by mouth 3 (three) times daily with meals.   IBUPROFEN (ADVIL,MOTRIN) 800 MG TABLET    Take 1 tablet (800 mg total) by mouth every 8 (eight) hours as needed (pain).         Hope Pigeon, PA-C 12/11/15 1722  Jeanmarie Plant, MD 12/11/15 [phone]

## 2015-12-11 NOTE — Discharge Instructions (Signed)
Dental Care and Dentist Visits °Dental care supports good overall health. Regular dental visits can also help you avoid dental pain, bleeding, infection, and other more serious health problems in the future. It is important to keep the mouth healthy because diseases in the teeth, gums, and other oral tissues can spread to other areas of the body. Some problems, such as diabetes, heart disease, and pre-term labor have been associated with poor oral health.  °See your dentist every 6 months. If you experience emergency problems such as a toothache or broken tooth, go to the dentist right away. If you see your dentist regularly, you may catch problems early. It is easier to be treated for problems in the early stages.  °WHAT TO EXPECT AT A DENTIST VISIT  °Your dentist will look for many common oral health problems and recommend proper treatment. At your regular dental visit, you can expect: °· Gentle cleaning of the teeth and gums. This includes scraping and polishing. This helps to remove the sticky substance around the teeth and gums (plaque). Plaque forms in the mouth shortly after eating. Over time, plaque hardens on the teeth as tartar. If tartar is not removed regularly, it can cause problems. Cleaning also helps remove stains. °· Periodic X-rays. These pictures of the teeth and supporting bone will help your dentist assess the health of your teeth. °· Periodic fluoride treatments. Fluoride is a natural mineral shown to help strengthen teeth. Fluoride treatment involves applying a fluoride gel or varnish to the teeth. It is most commonly done in children. °· Examination of the mouth, tongue, jaws, teeth, and gums to look for any oral health problems, such as: °¨ Cavities (dental caries). This is decay on the tooth caused by plaque, sugar, and acid in the mouth. It is best to catch a cavity when it is small. °¨ Inflammation of the gums caused by plaque buildup (gingivitis). °¨ Problems with the mouth or malformed  or misaligned teeth. °¨ Oral cancer or other diseases of the soft tissues or jaws.  °KEEP YOUR TEETH AND GUMS HEALTHY °For healthy teeth and gums, follow these general guidelines as well as your dentist's specific advice: °· Have your teeth professionally cleaned at the dentist every 6 months. °· Brush twice daily with a fluoride toothpaste. °· Floss your teeth daily.  °· Ask your dentist if you need fluoride supplements, treatments, or fluoride toothpaste. °· Eat a healthy diet. Reduce foods and drinks with added sugar. °· Avoid smoking. °TREATMENT FOR ORAL HEALTH PROBLEMS °If you have oral health problems, treatment varies depending on the conditions present in your teeth and gums. °· Your caregiver will most likely recommend good oral hygiene at each visit. °· For cavities, gingivitis, or other oral health disease, your caregiver will perform a procedure to treat the problem. This is typically done at a separate appointment. Sometimes your caregiver will refer you to another dental specialist for specific tooth problems or for surgery. °SEEK IMMEDIATE DENTAL CARE IF: °· You have pain, bleeding, or soreness in the gum, tooth, jaw, or mouth area. °· A permanent tooth becomes loose or separated from the gum socket. °· You experience a blow or injury to the mouth or jaw area. °  °This information is not intended to replace advice given to you by your health care provider. Make sure you discuss any questions you have with your health care provider. °  °Document Released: 07/13/2011 Document Revised: 01/23/2012 Document Reviewed: 07/13/2011 °Elsevier Interactive Patient Education ©2016 [employer] ° °

## 2015-12-31 DIAGNOSIS — F410 Panic disorder [episodic paroxysmal anxiety] without agoraphobia: Secondary | ICD-10-CM | POA: Diagnosis not present

## 2015-12-31 DIAGNOSIS — F4310 Post-traumatic stress disorder, unspecified: Secondary | ICD-10-CM | POA: Diagnosis not present

## 2015-12-31 DIAGNOSIS — F331 Major depressive disorder, recurrent, moderate: Secondary | ICD-10-CM | POA: Diagnosis not present

## 2015-12-31 DIAGNOSIS — Z79899 Other long term (current) drug therapy: Secondary | ICD-10-CM | POA: Diagnosis not present

## 2016-01-12 DIAGNOSIS — E785 Hyperlipidemia, unspecified: Secondary | ICD-10-CM | POA: Diagnosis not present

## 2016-01-12 DIAGNOSIS — E039 Hypothyroidism, unspecified: Secondary | ICD-10-CM | POA: Diagnosis not present

## 2016-01-12 DIAGNOSIS — E559 Vitamin D deficiency, unspecified: Secondary | ICD-10-CM | POA: Diagnosis not present

## 2016-02-25 ENCOUNTER — Telehealth: Payer: Self-pay | Admitting: *Deleted

## 2016-02-25 NOTE — Telephone Encounter (Signed)
l spoke with Mr. Rebecca Ramos informed him that i was calling to offer Mrs. Rebecca Ramos an appt. Mr. Rebecca Ramos stated that he would have her to give me a call when she returns. TD

## 2016-03-25 ENCOUNTER — Telehealth: Payer: Self-pay | Admitting: *Deleted

## 2016-03-25 NOTE — Telephone Encounter (Signed)
sw pt informed her that the doctor needs to r/s her appt for 04/01/16@1pm  . gave her an appt for 6/60/17@11 :30am...td

## 2016-03-26 ENCOUNTER — Encounter: Payer: Self-pay | Admitting: Emergency Medicine

## 2016-03-26 ENCOUNTER — Inpatient Hospital Stay
Admission: RE | Admit: 2016-03-26 | Discharge: 2016-03-29 | DRG: 885 | Disposition: A | Discharge status: Home / Self Care | Payer: Medicare Other | Source: Intra-hospital | Attending: Psychiatry | Admitting: Psychiatry

## 2016-03-26 ENCOUNTER — Emergency Department
Admission: EM | Admit: 2016-03-26 | Discharge: 2016-03-26 | Disposition: A | Discharge status: Other Acute Inpatient Hospital | Payer: Medicare Other | Attending: Emergency Medicine | Admitting: Emergency Medicine

## 2016-03-26 DIAGNOSIS — Z888 Allergy status to other drugs, medicaments and biological substances status: Secondary | ICD-10-CM

## 2016-03-26 DIAGNOSIS — M549 Dorsalgia, unspecified: Secondary | ICD-10-CM | POA: Diagnosis present

## 2016-03-26 DIAGNOSIS — Z599 Problem related to housing and economic circumstances, unspecified: Secondary | ICD-10-CM

## 2016-03-26 DIAGNOSIS — G4700 Insomnia, unspecified: Secondary | ICD-10-CM | POA: Diagnosis present

## 2016-03-26 DIAGNOSIS — I10 Essential (primary) hypertension: Secondary | ICD-10-CM | POA: Diagnosis present

## 2016-03-26 DIAGNOSIS — Z87891 Personal history of nicotine dependence: Secondary | ICD-10-CM | POA: Diagnosis not present

## 2016-03-26 DIAGNOSIS — Z882 Allergy status to sulfonamides status: Secondary | ICD-10-CM | POA: Diagnosis not present

## 2016-03-26 DIAGNOSIS — R45851 Suicidal ideations: Secondary | ICD-10-CM | POA: Insufficient documentation

## 2016-03-26 DIAGNOSIS — Z886 Allergy status to analgesic agent status: Secondary | ICD-10-CM

## 2016-03-26 DIAGNOSIS — G8929 Other chronic pain: Secondary | ICD-10-CM

## 2016-03-26 DIAGNOSIS — Z79899 Other long term (current) drug therapy: Secondary | ICD-10-CM | POA: Diagnosis not present

## 2016-03-26 DIAGNOSIS — R011 Cardiac murmur, unspecified: Secondary | ICD-10-CM | POA: Diagnosis present

## 2016-03-26 DIAGNOSIS — F332 Major depressive disorder, recurrent severe without psychotic features: Secondary | ICD-10-CM | POA: Diagnosis not present

## 2016-03-26 DIAGNOSIS — Z90710 Acquired absence of both cervix and uterus: Secondary | ICD-10-CM | POA: Diagnosis not present

## 2016-03-26 DIAGNOSIS — Z9889 Other specified postprocedural states: Secondary | ICD-10-CM

## 2016-03-26 DIAGNOSIS — T426X2A Poisoning by other antiepileptic and sedative-hypnotic drugs, intentional self-harm, initial encounter: Secondary | ICD-10-CM | POA: Diagnosis present

## 2016-03-26 DIAGNOSIS — T464X4A Poisoning by angiotensin-converting-enzyme inhibitors, undetermined, initial encounter: Secondary | ICD-10-CM | POA: Insufficient documentation

## 2016-03-26 DIAGNOSIS — F331 Major depressive disorder, recurrent, moderate: Secondary | ICD-10-CM | POA: Diagnosis not present

## 2016-03-26 DIAGNOSIS — K219 Gastro-esophageal reflux disease without esophagitis: Secondary | ICD-10-CM | POA: Diagnosis present

## 2016-03-26 DIAGNOSIS — F1420 Cocaine dependence, uncomplicated: Secondary | ICD-10-CM

## 2016-03-26 DIAGNOSIS — T50901A Poisoning by unspecified drugs, medicaments and biological substances, accidental (unintentional), initial encounter: Secondary | ICD-10-CM | POA: Diagnosis not present

## 2016-03-26 DIAGNOSIS — T1491XA Suicide attempt, initial encounter: Secondary | ICD-10-CM | POA: Diagnosis present

## 2016-03-26 HISTORY — DX: Acute embolism and thrombosis of unspecified vein: I82.90

## 2016-03-26 HISTORY — DX: Cardiac murmur, unspecified: R01.1

## 2016-03-26 LAB — COMPREHENSIVE METABOLIC PANEL
ALBUMIN: 4.2 g/dL (ref 3.5–5.0)
ALT: 28 U/L (ref 14–54)
AST: 21 U/L (ref 15–41)
Alkaline Phosphatase: 56 U/L (ref 38–126)
Anion gap: 6 (ref 5–15)
BUN: 9 mg/dL (ref 6–20)
CHLORIDE: 108 mmol/L (ref 101–111)
CO2: 24 mmol/L (ref 22–32)
Calcium: 9.3 mg/dL (ref 8.9–10.3)
Creatinine, Ser: 0.85 mg/dL (ref 0.44–1.00)
GFR calc Af Amer: >60 mL/min (ref >60)
GFR calc non Af Amer: >60 mL/min (ref >60)
GLUCOSE: 101 mg/dL — AB (ref 65–99)
POTASSIUM: 3.9 mmol/L (ref 3.5–5.1)
Sodium: 138 mmol/L (ref 135–145)
Total Bilirubin: 0.3 mg/dL (ref 0.3–1.2)
Total Protein: 7.3 g/dL (ref 6.5–8.1)

## 2016-03-26 LAB — CBC
HEMATOCRIT: 40.8 % (ref 35.0–47.0)
Hemoglobin: 14.2 g/dL (ref 12.0–16.0)
MCH: 30.1 pg (ref 26.0–34.0)
MCHC: 34.7 g/dL (ref 32.0–36.0)
MCV: 86.7 fL (ref 80.0–100.0)
PLATELETS: 310 10*3/uL (ref 150–440)
RBC: 4.7 MIL/uL (ref 3.80–5.20)
RDW: 13.4 % (ref 11.5–14.5)
WBC: 8.3 10*3/uL (ref 3.6–11.0)

## 2016-03-26 LAB — URINE DRUG SCREEN, QUALITATIVE (ARMC ONLY)
AMPHETAMINES, UR SCREEN: NOT DETECTED
Barbiturates, Ur Screen: NOT DETECTED
Benzodiazepine, Ur Scrn: POSITIVE — AB
Cannabinoid 50 Ng, Ur ~~LOC~~: NOT DETECTED
Cocaine Metabolite,Ur ~~LOC~~: POSITIVE — AB
MDMA (ECSTASY) UR SCREEN: NOT DETECTED
METHADONE SCREEN, URINE: NOT DETECTED
Opiate, Ur Screen: NOT DETECTED
PHENCYCLIDINE (PCP) UR S: NOT DETECTED
Tricyclic, Ur Screen: POSITIVE — AB

## 2016-03-26 LAB — ACETAMINOPHEN LEVEL

## 2016-03-26 LAB — SALICYLATE LEVEL: Salicylate Lvl: 4.5 mg/dL (ref 2.8–30.0)

## 2016-03-26 LAB — ETHANOL: ALCOHOL ETHYL (B): 7 mg/dL — AB (ref <5)

## 2016-03-26 LAB — TROPONIN I

## 2016-03-26 MED ORDER — AMLODIPINE BESYLATE 5 MG PO TABS
10.0000 mg | ORAL_TABLET | Freq: Every day | ORAL | Status: DC
  Administered 2016-03-26: 10 mg via ORAL
  Filled 2016-03-26: qty 2

## 2016-03-26 MED ORDER — ZOLPIDEM TARTRATE 5 MG PO TABS
5.0000 mg | ORAL_TABLET | Freq: Every evening | ORAL | Status: DC | PRN
  Administered 2016-03-27 (×2): 5 mg via ORAL
  Filled 2016-03-26 (×2): qty 1

## 2016-03-26 MED ORDER — IBUPROFEN 600 MG PO TABS: 600.0000 mg | ORAL_TABLET | Freq: Four times a day (QID) | ORAL | Status: DC | PRN

## 2016-03-26 MED ORDER — KETOROLAC TROMETHAMINE 30 MG/ML IJ SOLN
15.0000 mg | Freq: Once | INTRAMUSCULAR | Status: AC
  Administered 2016-03-26: 15 mg via INTRAVENOUS
  Filled 2016-03-26: qty 1

## 2016-03-26 MED ORDER — ZOLPIDEM TARTRATE 5 MG PO TABS: 5.0000 mg | ORAL_TABLET | Freq: Every evening | ORAL | Status: DC | PRN

## 2016-03-26 MED ORDER — AMLODIPINE BESYLATE 10 MG PO TABS
10.0000 mg | ORAL_TABLET | Freq: Every day | ORAL | Status: DC
  Administered 2016-03-27 – 2016-03-29 (×3): 10 mg via ORAL
  Filled 2016-03-26 (×3): qty 1

## 2016-03-26 MED ORDER — FLUTICASONE PROPIONATE 50 MCG/ACT NA SUSP
2.0000 | Freq: Every day | NASAL | Status: DC
  Administered 2016-03-26: 2 via NASAL
  Filled 2016-03-26: qty 16

## 2016-03-26 MED ORDER — PANTOPRAZOLE SODIUM 40 MG PO TBEC
40.0000 mg | DELAYED_RELEASE_TABLET | Freq: Every day | ORAL | Status: DC
  Administered 2016-03-26: 40 mg via ORAL
  Filled 2016-03-26: qty 1

## 2016-03-26 MED ORDER — ALPRAZOLAM 0.5 MG PO TABS
ORAL_TABLET | ORAL | Status: AC
  Administered 2016-03-26: 1 mg via ORAL
  Filled 2016-03-26: qty 1

## 2016-03-26 MED ORDER — MAGNESIUM HYDROXIDE 400 MG/5ML PO SUSP: 30.0000 mL | Freq: Every day | ORAL | Status: DC | PRN

## 2016-03-26 MED ORDER — ALPRAZOLAM 0.5 MG PO TABS
1.0000 mg | ORAL_TABLET | Freq: Three times a day (TID) | ORAL | Status: DC
  Administered 2016-03-26 (×2): 1 mg via ORAL
  Filled 2016-03-26 (×2): qty 2

## 2016-03-26 MED ORDER — ACETAMINOPHEN 325 MG PO TABS
650.0000 mg | ORAL_TABLET | Freq: Four times a day (QID) | ORAL | Status: DC | PRN
  Administered 2016-03-27: 650 mg via ORAL
  Filled 2016-03-26: qty 2

## 2016-03-26 MED ORDER — ALPRAZOLAM 1 MG PO TABS
1.0000 mg | ORAL_TABLET | Freq: Three times a day (TID) | ORAL | Status: DC
  Administered 2016-03-27 – 2016-03-28 (×4): 1 mg via ORAL
  Filled 2016-03-26 (×4): qty 1

## 2016-03-26 MED ORDER — FLUTICASONE PROPIONATE 50 MCG/ACT NA SUSP
2.0000 | Freq: Every day | NASAL | Status: DC
  Administered 2016-03-27 – 2016-03-29 (×3): 2 via NASAL
  Filled 2016-03-26: qty 16

## 2016-03-26 MED ORDER — ACETAMINOPHEN 325 MG PO TABS
650.0000 mg | ORAL_TABLET | Freq: Once | ORAL | Status: AC
  Administered 2016-03-26: 650 mg via ORAL
  Filled 2016-03-26: qty 2

## 2016-03-26 MED ORDER — PANTOPRAZOLE SODIUM 40 MG PO TBEC
40.0000 mg | DELAYED_RELEASE_TABLET | Freq: Every day | ORAL | Status: DC
  Administered 2016-03-27 – 2016-03-29 (×3): 40 mg via ORAL
  Filled 2016-03-26 (×5): qty 1
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
## 2016-03-26 NOTE — ED Notes (Signed)
Pt is calm and cooperative on admission. Pt denies SI/HI and AVH. Pt states that she does not wish to be admitted to in patient unit, but accepts the situation and is calm and cooperative with staff. 15 minute checks ongoing for safety.

## 2016-03-26 NOTE — ED Notes (Signed)
Pt presents today via [facility] with c/o drug overdose. Per EMS pt has a hx of overdosing. Pt states today she took 5040 ambien and 40 lisinopril. EMS was called by her boyfriend, pt stated she "just wanted to take a nap".

## 2016-03-26 NOTE — ED Provider Notes (Signed)
Time Seen: Approximately 1522 I have reviewed the triage notes  Chief Complaint: Drug Overdose   History of Present Illness: Rebecca Ramos is a 55 y.o. female who has a history of overdoses and states that today she "" just wanted to go to to sleep and take a nap "". She states she took 40 each of Ambien and lisinopril at approximately noon. The boyfriend states that he was handful of each of the medications. She denies any illicit drugs or alcohol ingestion. She denies any homicidal thoughts or hallucinations. She states that she has some tendinitis on the backside of her ankle and had some chest pain over the last 48 hours. She denies any arm or jaw pain.   Past Medical History  Diagnosis Date  . Hypertension   . Acid reflux   . HTN (hypertension) 03/20/2015  . Heart murmur     Patient Active Problem List   Diagnosis Date Noted  . Cellulitis 03/20/2015  . HTN (hypertension) 03/20/2015    Past Surgical History  Procedure Laterality Date  . Abdominal hysterectomy    . Cesarean section Bilateral   . Tonsillectomy    . Cesarean section Bilateral   . Elbow surgery      bilateral  . Thumb surgery      right     Past Surgical History  Procedure Laterality Date  . Abdominal hysterectomy    . Cesarean section Bilateral   . Tonsillectomy    . Cesarean section Bilateral   . Elbow surgery      bilateral  . Thumb surgery      right     Current Outpatient Rx  Name  Route  Sig  Dispense  Refill  . alprazolam (XANAX) 2 MG tablet   Oral   Take 1 mg by mouth 4 (four) times daily.          Marland Kitchen amLODipine (NORVASC) 10 MG tablet   Oral   Take 10 mg by mouth daily.         Marland Kitchen amoxicillin (AMOXIL) 500 MG tablet   Oral   Take 1 tablet (500 mg total) by mouth 3 (three) times daily with meals.   30 tablet   0   . cyclobenzaprine (FLEXERIL) 10 MG tablet   Oral   Take 1 tablet (10 mg total) by mouth every 8 (eight) hours as needed for muscle spasms.   21 tablet   0   .  cyclobenzaprine (FLEXERIL) 10 MG tablet   Oral   Take 1 tablet (10 mg total) by mouth every 8 (eight) hours as needed for muscle spasms.   21 tablet   0   . doxycycline (VIBRA-TABS) 100 MG tablet   Oral   Take 1 tablet (100 mg total) by mouth 2 (two) times daily.   20 tablet   0   . esomeprazole (NEXIUM) 40 MG capsule   Oral   Take 40 mg by mouth daily.          . fluticasone (FLONASE) 50 MCG/ACT nasal spray   Each Nare   Place 2 sprays into both nostrils daily as needed for rhinitis.          Marland Kitchen HYDROcodone-acetaminophen (NORCO) 5-325 MG tablet   Oral   Take 1 tablet by mouth every 4 (four) hours as needed for moderate pain.   20 tablet   0   . ibuprofen (ADVIL,MOTRIN) 800 MG tablet   Oral   Take 1 tablet (800  mg total) by mouth every 8 (eight) hours as needed (pain).   60 tablet   0   . lisinopril (PRINIVIL,ZESTRIL) 10 MG tablet   Oral   Take 10 mg by mouth daily.         . magic mouthwash w/lidocaine SOLN   Oral   Take 5 mLs by mouth 4 (four) times daily.   100 mL   0   . Oxycodone HCl 10 MG TABS   Oral   Take 10 mg by mouth 3 (three) times daily as needed (for pain).         . predniSONE (DELTASONE) 20 MG tablet   Oral   Take 3 tablets (60 mg total) by mouth daily with breakfast.   15 tablet   0   . traMADol (ULTRAM) 50 MG tablet   Oral   Take 1 tablet (50 mg total) by mouth 2 (two) times daily.   10 tablet   0   . zolpidem (AMBIEN) 10 MG tablet   Oral   Take 10 mg by mouth at bedtime as needed for sleep.           Allergies:  Sulfa antibiotics  Family History: History reviewed. No pertinent family history.  Social History: Social History  Substance Use Topics  . Smoking status: Former Games developermoker  . Smokeless tobacco: None  . Alcohol Use: No     Review of Systems:   10 point review of systems was performed and was otherwise negative:  Constitutional: No fever Eyes: No visual disturbances ENT: No sore throat, ear  pain Cardiac: No chest pain Respiratory: No shortness of breath, wheezing, or stridor Abdomen: No abdominal pain, no vomiting, No diarrhea Endocrine: No weight loss, No night sweats Extremities: No peripheral edema, cyanosis Skin: No rashes, easy bruising Neurologic: No focal weakness, trouble with speech or swollowing Urologic: No dysuria, Hematuria, or urinary frequency   Physical Exam:  ED Triage Vitals  Enc Vitals Group     BP --      Pulse --      Resp --      Temp --      Temp src --      SpO2 03/26/16 1507 95 %     Weight --      Height --      Head Cir --      Peak Flow --      Pain Score 03/26/16 1509 8     Pain Loc --      Pain Edu? --      Excl. in GC? --     General: Awake , Alert , and Oriented times 3; GCS 15 Head: Normal cephalic , atraumatic Eyes: Pupils equal , round, reactive to light Nose/Throat: No nasal drainage, patent upper airway without erythema or exudate.  Neck: Supple, Full range of motion, No anterior adenopathy or palpable thyroid masses Lungs: Clear to ascultation without wheezes , rhonchi, or rales Heart: Regular rate, regular rhythm without murmurs , gallops , or rubs Abdomen: Soft, non tender without rebound, guarding , or rigidity; bowel sounds positive and symmetric in all 4 quadrants. No organomegaly .        Extremities: 2 plus symmetric pulses. No edema, clubbing or cyanosis Neurologic: normal ambulation, Motor symmetric without deficits, sensory intact Skin: warm, dry, no rashes   Labs:   All laboratory work was reviewed including any pertinent negatives or positives listed below:  Labs Reviewed  COMPREHENSIVE METABOLIC PANEL  ETHANOL  SALICYLATE LEVEL  ACETAMINOPHEN LEVEL  CBC  URINE DRUG SCREEN, QUALITATIVE ([facility] ONLY)  CBG MONITORING, ED  Review of laboratory work shows positive for cocaine metabolites. Otherwise, no other significant findings     ED Course:  Patient's stay here was uneventful patient's otherwise  hemodynamically stable. She was observed for several hours and did not have any adverse effects from an Ambien and/or ACE inhibitor ingestion. The patient did take an overdose with intent to harm herself and was involuntarily committed and seen by psychiatry who agrees. Patient is now medically clear for admission to the psychiatric unit.    Assessment: Prescription drug overdose Suicidal ideation      Plan: * Inpatient management           Jennye Moccasin, MD 03/26/16 2206

## 2016-03-26 NOTE — Consult Note (Signed)
[facility] Consult   Reason for Consult:  Consult for 55 year old woman brought into the emergency room after taking an overdose of sleeping pills Referring Physician:  Marcelene Butte Patient Identification: Rebecca Ramos MRN:  [id number] Principal Diagnosis: Severe recurrent major depression without psychotic features [facility]) Diagnosis:   Patient Active Problem List   Diagnosis Date Noted  . Severe recurrent major depression without psychotic features ([place]) [F33.2] 03/26/2016  . Suicide attempt ([place]) [T14.91] 03/26/2016  . Overdose [T50.901A] 03/26/2016  . Chronic back pain [M54.9, G89.29] 03/26/2016  . Cellulitis [L03.90] 03/20/2015  . HTN (hypertension) [I10] 03/20/2015    Total Time spent with patient: 1 hour  Subjective:   Rebecca Ramos is a 55 y.o. female patient admitted with "I just wanted to take a nap".  HPI:  Patient interviewed. Chart reviewed. Case discussed with emergency room physician and TTS. Vitals and labs reviewed. Patient brought in by [facility] Department with commitment paperwork because she took an overdose of both her sleeping medicine and her blood pressure medicine. Patient tells me that she took 40 Ambien tablets because she wanted to take a "nap". She says when that didn't work she decided to take 40 lisinopril tablets. She can't give me any logical reason why adding a blood pressure medicine was her next move. Patient immediately goes into telling me how stressed out and depressed she is been. Mood feels bad all the time. Frequent crying spells. Sleep is very poor and disrupted. Appetite is poor. Passive suicidal ideation. No psychosis. She blames most of her stress on her chronic back pain.  Social history: Lives with her husband. They are in serious financial difficulty and she is in constant arguments with her landlord.  Medical history: Chronic back pain. History of high blood pressure and cellulitis.  Substance abuse history: Patient  denies abuse of alcohol or other drugs.    Past Psychiatric History: Patient has had previous psychiatric hospitalization and suicide attempts. Currently seen by Dr. Kasandra Knudsen in [place] who prescribes high dose Xanax and no antidepressant for her.  Risk to Self: Suicidal Ideation: No Suicidal Intent: No Is patient at risk for suicide?: Yes Suicidal Plan?: No Triggers for Past Attempts: Other (Comment) (Mother passed away) Intentional Self Injurious Behavior: None Risk to Others: Homicidal Ideation: No Thoughts of Harm to Others: No Current Homicidal Intent: No Current Homicidal Plan: No Access to Homicidal Means: No Identified Victim: Reports of none History of harm to others?: No Assessment of Violence: None Noted Violent Behavior Description: Reports of none Does patient have access to weapons?: No Criminal Charges Pending?: No Does patient have a court date: No Prior Inpatient Therapy: Prior Inpatient Therapy: Yes Prior Outpatient Therapy:    Past Medical History:  Past Medical History  Diagnosis Date  . Hypertension   . Acid reflux   . HTN (hypertension) 03/20/2015  . Heart murmur     Past Surgical History  Procedure Laterality Date  . Abdominal hysterectomy    . Cesarean section Bilateral   . Tonsillectomy    . Cesarean section Bilateral   . Elbow surgery      bilateral  . Thumb surgery      right    Family History: History reviewed. No pertinent family history. Family Psychiatric  History: Positive for suicide in 2 uncles Social History:  History  Alcohol Use No     History  Drug Use No    Social History   Social History  . Marital Status: Single  Spouse Name: N/A  . Number of Children: N/A  . Years of Education: N/A   Social History Main Topics  . Smoking status: Former Research scientist (life sciences)  . Smokeless tobacco: None  . Alcohol Use: No  . Drug Use: No  . Sexual Activity: Yes    Birth Control/ Protection: Surgical   Other Topics Concern  . None   Social  History Narrative   Additional Social History:    Allergies:   Allergies  Allergen Reactions  . Sulfa Antibiotics Hives and Swelling    Labs:  Results for orders placed or performed during the hospital encounter of 03/26/16 (from the past 48 hour(s))  Comprehensive metabolic panel     Status: Abnormal   Collection Time: 03/26/16  3:38 PM  Result Value Ref Range   Sodium 138 135 - 145 mmol/L   Potassium 3.9 3.5 - 5.1 mmol/L   Chloride 108 101 - 111 mmol/L   CO2 24 22 - 32 mmol/L   Glucose, Bld 101 (H) 65 - 99 mg/dL   BUN 9 6 - 20 mg/dL   Creatinine, Ser 0.85 0.44 - 1.00 mg/dL   Calcium 9.3 8.9 - 10.3 mg/dL   Total Protein 7.3 6.5 - 8.1 g/dL   Albumin 4.2 3.5 - 5.0 g/dL   AST 21 15 - 41 U/L   ALT 28 14 - 54 U/L   Alkaline Phosphatase 56 38 - 126 U/L   Total Bilirubin 0.3 0.3 - 1.2 mg/dL   GFR calc non Af Amer >60 >60 mL/min   GFR calc Af Amer >60 >60 mL/min    Comment: (NOTE) The eGFR has been calculated using the CKD EPI equation. This calculation has not been validated in all clinical situations. eGFR's persistently <60 mL/min signify possible Chronic Kidney Disease.    Anion gap 6 5 - 15  Ethanol     Status: Abnormal   Collection Time: 03/26/16  3:38 PM  Result Value Ref Range   Alcohol, Ethyl (B) 7 (H) <5 mg/dL    Comment:        LOWEST DETECTABLE LIMIT FOR SERUM ALCOHOL IS 5 mg/dL FOR MEDICAL PURPOSES ONLY   Salicylate level     Status: None   Collection Time: 03/26/16  3:38 PM  Result Value Ref Range   Salicylate Lvl 4.5 2.8 - 30.0 mg/dL  Acetaminophen level     Status: Abnormal   Collection Time: 03/26/16  3:38 PM  Result Value Ref Range   Acetaminophen (Tylenol), Serum <10 (L) 10 - 30 ug/mL    Comment:        THERAPEUTIC CONCENTRATIONS VARY SIGNIFICANTLY. A RANGE OF 10-30 ug/mL MAY BE AN EFFECTIVE CONCENTRATION FOR MANY PATIENTS. HOWEVER, SOME ARE BEST TREATED AT CONCENTRATIONS OUTSIDE THIS RANGE. ACETAMINOPHEN CONCENTRATIONS >150 ug/mL AT 4  HOURS AFTER INGESTION AND >50 ug/mL AT 12 HOURS AFTER INGESTION ARE OFTEN ASSOCIATED WITH TOXIC REACTIONS.   cbc     Status: None   Collection Time: 03/26/16  3:38 PM  Result Value Ref Range   WBC 8.3 3.6 - 11.0 K/uL   RBC 4.70 3.80 - 5.20 MIL/uL   Hemoglobin 14.2 12.0 - 16.0 g/dL   HCT 40.8 35.0 - 47.0 %   MCV 86.7 80.0 - 100.0 fL   MCH 30.1 26.0 - 34.0 pg   MCHC 34.7 32.0 - 36.0 g/dL   RDW 13.4 11.5 - 14.5 %   Platelets 310 150 - 440 K/uL  Urine Drug Screen, Qualitative     Status:  Abnormal   Collection Time: 03/26/16  3:38 PM  Result Value Ref Range   Tricyclic, Ur Screen POSITIVE (A) NONE DETECTED   Amphetamines, Ur Screen NONE DETECTED NONE DETECTED   MDMA (Ecstasy)Ur Screen NONE DETECTED NONE DETECTED   Cocaine Metabolite,Ur [place] POSITIVE (A) NONE DETECTED   Opiate, Ur Screen NONE DETECTED NONE DETECTED   Phencyclidine (PCP) Ur S NONE DETECTED NONE DETECTED   Cannabinoid 50 Ng, Ur [place] NONE DETECTED NONE DETECTED   Barbiturates, Ur Screen NONE DETECTED NONE DETECTED   Benzodiazepine, Ur Scrn POSITIVE (A) NONE DETECTED   Methadone Scn, Ur NONE DETECTED NONE DETECTED    Comment: (NOTE) 932  Tricyclics, urine               Cutoff 1000 ng/mL 200  Amphetamines, urine             Cutoff 1000 ng/mL 300  MDMA (Ecstasy), urine           Cutoff 500 ng/mL 400  Cocaine Metabolite, urine       Cutoff 300 ng/mL 500  Opiate, urine                   Cutoff 300 ng/mL 600  Phencyclidine (PCP), urine      Cutoff 25 ng/mL 700  Cannabinoid, urine              Cutoff 50 ng/mL 800  Barbiturates, urine             Cutoff 200 ng/mL 900  Benzodiazepine, urine           Cutoff 200 ng/mL 1000 Methadone, urine                Cutoff 300 ng/mL 1100 1200 The urine drug screen provides only a preliminary, unconfirmed 1300 analytical test result and should not be used for non-medical 1400 purposes. Clinical consideration and professional judgment should 1500 be applied to any positive drug screen  result due to possible 1600 interfering substances. A more specific alternate chemical method 1700 must be used in order to obtain a confirmed analytical result.  1800 Gas chromato graphy / mass spectrometry (GC/MS) is the preferred 1900 confirmatory method.   Troponin I     Status: None   Collection Time: 03/26/16  3:38 PM  Result Value Ref Range   Troponin I <0.03 <0.031 ng/mL    Comment:        NO INDICATION OF MYOCARDIAL INJURY.     Current Facility-Administered Medications  Medication Dose Route Frequency Provider Last Rate Last Dose  . ALPRAZolam (XANAX) tablet 1 mg  1 mg Oral TID Gonzella Lex, MD      . amLODipine (NORVASC) tablet 10 mg  10 mg Oral Daily Olanda Downie T Regis Wiland, MD      . fluticasone (FLONASE) 50 MCG/ACT nasal spray 2 spray  2 spray Each Nare Daily Paelyn Smick T Dailynn Nancarrow, MD      . ibuprofen (ADVIL,MOTRIN) tablet 600 mg  600 mg Oral Q6H PRN Gonzella Lex, MD      . pantoprazole (PROTONIX) EC tablet 40 mg  40 mg Oral Daily Natsuko Kelsay T Taytum Wheller, MD      . zolpidem (AMBIEN) tablet 5 mg  5 mg Oral QHS PRN Gonzella Lex, MD       Current Outpatient Prescriptions  Medication Sig Dispense Refill  . alprazolam (XANAX) 2 MG tablet Take 1 mg by mouth 4 (four) times daily.     Marland Kitchen amLODipine (  NORVASC) 10 MG tablet Take 10 mg by mouth daily.    Marland Kitchen amoxicillin (AMOXIL) 500 MG tablet Take 1 tablet (500 mg total) by mouth 3 (three) times daily with meals. 30 tablet 0  . cyclobenzaprine (FLEXERIL) 10 MG tablet Take 1 tablet (10 mg total) by mouth every 8 (eight) hours as needed for muscle spasms. 21 tablet 0  . cyclobenzaprine (FLEXERIL) 10 MG tablet Take 1 tablet (10 mg total) by mouth every 8 (eight) hours as needed for muscle spasms. 21 tablet 0  . doxycycline (VIBRA-TABS) 100 MG tablet Take 1 tablet (100 mg total) by mouth 2 (two) times daily. 20 tablet 0  . esomeprazole (NEXIUM) 40 MG capsule Take 40 mg by mouth daily.     . fluticasone (FLONASE) 50 MCG/ACT nasal spray Place 2 sprays into  both nostrils daily as needed for rhinitis.     Marland Kitchen HYDROcodone-acetaminophen (NORCO) 5-325 MG tablet Take 1 tablet by mouth every 4 (four) hours as needed for moderate pain. 20 tablet 0  . ibuprofen (ADVIL,MOTRIN) 800 MG tablet Take 1 tablet (800 mg total) by mouth every 8 (eight) hours as needed (pain). 60 tablet 0  . lisinopril (PRINIVIL,ZESTRIL) 10 MG tablet Take 10 mg by mouth daily.    . magic mouthwash w/lidocaine SOLN Take 5 mLs by mouth 4 (four) times daily. 100 mL 0  . Oxycodone HCl 10 MG TABS Take 10 mg by mouth 3 (three) times daily as needed (for pain).    . predniSONE (DELTASONE) 20 MG tablet Take 3 tablets (60 mg total) by mouth daily with breakfast. 15 tablet 0  . traMADol (ULTRAM) 50 MG tablet Take 1 tablet (50 mg total) by mouth 2 (two) times daily. 10 tablet 0  . zolpidem (AMBIEN) 10 MG tablet Take 10 mg by mouth at bedtime as needed for sleep.      Musculoskeletal: Strength & Muscle Tone: within normal limits Gait & Station: normal Patient leans: N/A  Psychiatric Specialty Exam: Review of Systems  Constitutional: Negative.   HENT: Negative.   Eyes: Negative.   Respiratory: Negative.   Cardiovascular: Negative.   Gastrointestinal: Negative.   Musculoskeletal: Positive for back pain.  Skin: Negative.   Neurological: Negative.   Psychiatric/Behavioral: Positive for depression and suicidal ideas. Negative for hallucinations, memory loss and substance abuse. The patient is nervous/anxious and has insomnia.     Blood pressure 123/87, pulse 73, temperature 97.9 F (36.6 C), temperature source Oral, resp. rate 16, SpO2 99 %.There is no weight on file to calculate BMI.  General Appearance: Fairly Groomed  Engineer, water::  Minimal  Speech:  Slow  Volume:  Decreased  Mood:  Depressed  Affect:  Depressed  Thought Process:  Loose  Orientation:  Full (Time, Place, and Person)  Thought Content:  Negative  Suicidal Thoughts:  Yes.  with intent/plan  Homicidal Thoughts:  No   Memory:  Immediate;   Good Recent;   Poor Remote;   Fair  Judgement:  Poor  Insight:  Shallow  Psychomotor Activity:  Decreased  Concentration:  Poor  Recall:  [employer] of Knowledge:Fair  Language: Fair  Akathisia:  No  Handed:  Right  AIMS (if indicated):     Assets:  Housing Resilience  ADL's:  Intact  Cognition: WNL  Sleep:      Treatment Plan Summary: Daily contact with patient to assess and evaluate symptoms and progress in treatment, Medication management and Plan Because of patient's admitting this was a suicide attempt in  the large number of pills she took and the multiple symptoms of depression she will be admitted to the psychiatry ward. Continuous observation. I pulled IVC. I have checked the controlled substance database and her actual dose of Xanax is significantly less than what she was quoting. I will be allowing her 1 mg 3 times a day which is more in line with what she normally gets. Minimize most other sedating medicines. 5 mg only of Ambien. Blood pressure medicine. Ibuprofen for pain. Continuous observation and nursing evaluation.  Disposition: Recommend psychiatric Inpatient admission when medically cleared. Supportive therapy provided about ongoing stressors.  Alethia Berthold, MD 03/26/2016 5:24 PM

## 2016-03-26 NOTE — ED Notes (Signed)
Patient states she took the pills because she was tired of hurting in her lower back and right foot. Patient states she had appointment with pain clinic which was rescheduled to 6/30.

## 2016-03-27 DIAGNOSIS — F331 Major depressive disorder, recurrent, moderate: Secondary | ICD-10-CM

## 2016-03-27 NOTE — Plan of Care (Signed)
Problem: Ineffective individual coping Goal: STG: Patient will remain free from self harm Outcome: Progressing Pt denies SI and has remained free from self harm     

## 2016-03-27 NOTE — Progress Notes (Signed)
Patient alert and oriented. Cooperative with all nursing interventions. Attending groups and social with peers. Patient denies significant depressed mood. Denies SI. She still asserts she just took extra pills for her blood pressure. No evidence of psychotic thinking. Continue with current treatment plan and monitor response.

## 2016-03-27 NOTE — Tx Team (Signed)
Initial Interdisciplinary Treatment Plan   PATIENT STRESSORS: Financial difficulties   PATIENT STRENGTHS: Ability for insight Average or above average intelligence Communication skills Motivation for treatment/growth Supportive family/friends   PROBLEM LIST: Problem List/Patient Goals Date to be addressed Date deferred Reason deferred Estimated date of resolution  Depression 03/26/16     Suicide attempt 03/26/16     "Figure out ways to manage stress" 03/26/16                                          DISCHARGE CRITERIA:  Improved stabilization in mood, thinking, and/or behavior  PRELIMINARY DISCHARGE PLAN: Outpatient therapy  PATIENT/FAMIILY INVOLVEMENT: This treatment plan has been presented to and reviewed with the patient, Rebecca Ramos.  The patient and family have been given the opportunity to ask questions and make suggestions.  Jonette MateLuke B Keilani Terrance 03/27/2016, 5:37 AM

## 2016-03-27 NOTE — [facility] Suicide Risk Assessment (Signed)
[facility] INPATIENT:  Family/Significant Other Suicide Prevention Education  Suicide Prevention Education:  Education Completed; Rebecca Ramos (husband)  has been identified by the patient as the family member/significant other with whom the patient will be residing, and identified as the person(s) who will aid the patient in the event of a mental health crisis (suicidal ideations/suicide attempt).  With written consent from the patient, the family member/significant other has been provided the following suicide prevention education, prior to the and/or following the discharge of the patient.  The suicide prevention education provided includes the following:  Suicide risk factors  Suicide prevention and interventions  National Suicide Hotline telephone number  [facility] assessment telephone number  [facility] Assistance 911  [facility] and/or Residential Mobile Crisis Unit telephone number  Request made of family/significant other to:  Remove weapons (e.g., guns, rifles, knives), all items previously/currently identified as safety concern.    Remove drugs/medications (over-the-counter, prescriptions, illicit drugs), all items previously/currently identified as a safety concern.  The family member/significant other verbalizes understanding of the suicide prevention education information provided.  The family member/significant other agrees to remove the items of safety concern listed above. Pt reports she owns a gun, however her husband states they do not.   Rebecca Ramos MSW, LCSWA  03/27/2016, 4:00 PM
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
## 2016-03-27 NOTE — [facility] Suicide Risk Assessment (Signed)
[facility]   Nursing information obtained from:  Patient Demographic factors:  Divorced or widowed, Low socioeconomic status, Unemployed, Access to firearms Current Mental Status:  NA (Pt denies) Loss Factors:  Financial problems / change in socioeconomic status Historical Factors:  Prior suicide attempts, Family history of suicide, Family history of mental illness or substance abuse Risk Reduction Factors:  Responsible for children under 53 years of age, Sense of responsibility to family, Living with another person, especially a relative, Positive therapeutic relationship  Total Time spent with patient: 1 hour Principal Problem: Major depression (HCC) Diagnosis:   Patient Active Problem List   Diagnosis Date Noted  . Severe recurrent major depression without psychotic features (HCC) [F33.2] 03/26/2016  . Suicide attempt (HCC) [T14.91] 03/26/2016  . Overdose [T50.901A] 03/26/2016  . Chronic back pain [M54.9, G89.29] 03/26/2016  . Major depression (HCC) [F32.9] 03/26/2016  . Cellulitis [L03.90] 03/20/2015  . HTN (hypertension) [I10] 03/20/2015   Subjective Data: Patient with recurrent impulsive behavior and suicide attempts and a history of substance abuse with poor insight. Not psychotic. Currently denies suicidal ideation.  Continued Clinical Symptoms:  Alcohol Use Disorder Identification Test Final Score (AUDIT): 0 The "Alcohol Use Disorders Identification Test", Guidelines for Use in Primary Care, Second Edition.  World Science writerHealth Organization [facility](WHO). Score between 0-7:  no or low risk or alcohol related problems. Score between 8-15:  moderate risk of alcohol related problems. Score between 16-19:  high risk of alcohol related problems. Score 20 or above:  warrants further diagnostic evaluation for alcohol dependence and treatment.   CLINICAL FACTORS:   Severe Anxiety and/or Agitation Depression:   Impulsivity Alcohol/Substance  Abuse/Dependencies   Musculoskeletal: Strength & Muscle Tone: within normal limits Gait & Station: normal Patient leans: N/A  Psychiatric Specialty Exam: ROS  Blood pressure 127/89, pulse 64, temperature 97.6 F (36.4 C), temperature source Oral, resp. rate 18, height 5\' 6"  (1.676 m), weight 94.802 kg (209 lb), SpO2 100 %.Body mass index is 33.75 kg/(m^2).  General Appearance: Casual  Eye Contact::  Fair  Speech:  Clear and Coherent  Volume:  Normal  Mood:  Euthymic  Affect:  Congruent  Thought Process:  Coherent  Orientation:  Full (Time, Place, and Person)  Thought Content:  Negative  Suicidal Thoughts:  No  Homicidal Thoughts:  No  Memory:  Immediate;   Good Recent;   Fair Remote;   Good  Judgement:  Impaired  Insight:  Shallow  Psychomotor Activity:  Decreased  Concentration:  Fair  Recall:  [employer]Fair  Fund of Knowledge:Fair  Language: Fair  Akathisia:  No  Handed:  Right  AIMS (if indicated):     Assets:  Desire for Improvement Leisure Time Resilience  Sleep:  Number of Hours: 4.5  Cognition: WNL  ADL's:  Intact    COGNITIVE FEATURES THAT CONTRIBUTE TO RISK:  Loss of executive function    SUICIDE RISK:   Mild:  Suicidal ideation of limited frequency, intensity, duration, and specificity.  There are no identifiable plans, no associated intent, mild dysphoria and related symptoms, good self-control (both objective and subjective assessment), few other risk factors, and identifiable protective factors, including available and accessible social support.  PLAN OF CARE: Patient has continuous risk of suicidal behavior because of impulsivity. Currently not suicidal. No risk significantly in the hospital. Patient needs to continue current medicines although she should be counseled about the possible availability of other treatments in the community. Continuous observation for now.  I certify that inpatient services  furnished can reasonably be expected to improve the  patient's condition.   Mordecai Rasmussen, MD 03/27/2016, 2:40 PM

## 2016-03-27 NOTE — Progress Notes (Signed)
D: Pt received from [facility]. No issues noted on skin check and no contraband found. Patient alert and oriented x4. Patient denies SI/HI/AVH. Pt affect is anxious. Pt discussed financial issues stemming from her husband being injured and having hospitals bills of "$88,000.00." Pt also discussed issues with current land lord, and possible legal concerns stemming from. Pt reiterated that she took "40 lisinopril and 10 ambien" but she "just wanted to sleep" and denied it as a suicide attempt. Pt mentioned that she has had "bleeding ulcer" in that past and indicated she could not take Ibuprofen. Pt c/o of increasing forgetfulness and decreasing concentration over that past 4 months. Pt c/o of chronic low back pain due to osteoarthritis. A: Skin and contraband check performed with Lynnea Ferrierhristine RN. Reviewed admission material with pt. Educated pt on unit policy. Oriented pt to unit. Offered active listening and support. Provided therapeutic communication. Gave tylenol prn for pain and Ambien prn for sleep.  R: Pt pleasant and cooperative. Pt medication compliant. Will continue Q15 min. checks. Safety maintained.

## 2016-03-27 NOTE — Progress Notes (Signed)
Rebecca Ramos was pleasant on approach. She interacted well with peers and staff. Affect was bright on approach, she reported mood being good, she denied any issues, she had no behavioral issues on shift.  Patient appears to be sleep resting in bed.

## 2016-03-27 NOTE — [facility] Group Notes (Signed)
[facility] LCSW Group Therapy  03/27/2016 2:23 PM  Type of Therapy:  Group Therapy  Participation Level:  Active  Participation Quality:  Attentive  Affect:  Appropriate   Cognitive:  Alert  Insight:  Limited  Engagement in Therapy:  Limited  Modes of Intervention:  Discussion, Education, Socialization and Support  Summary of Progress/Problems: Pt will identify unhealthy thoughts and how they impact their emotions and behavior. Pt will be encouraged to discuss these thoughts, emotions and behaviors with the group. Pt attended group and stayed the entire time. She was supportive of other group members. She continuously insisted she was not trying to kill herself.   Sempra EnergyCandace L Juleah Paradise MSW, LCSWA  03/27/2016, 2:23 PM

## 2016-03-27 NOTE — H&P (Signed)
Psychiatric Admission Assessment Adult  Patient Identification: Rebecca Ramos MRN:  [id number] Date of Evaluation:  03/27/2016 Chief Complaint:  depression Principal Diagnosis: <principal problem not specified> Diagnosis:   Patient Active Problem List   Diagnosis Date Noted  . Severe recurrent major depression without psychotic features ([place]) [F33.2] 03/26/2016  . Suicide attempt ([place]) [T14.91] 03/26/2016  . Overdose [T50.901A] 03/26/2016  . Chronic back pain [M54.9, G89.29] 03/26/2016  . Major depression ([place]) [F32.9] 03/26/2016  . Cellulitis [L03.90] 03/20/2015  . HTN (hypertension) [I10] 03/20/2015   History of Present Illness:: Patient is a 55 year old woman presented to the emergency room after overdose on Ambien and lisinopril. Appeared to be likely with self injury intent. Recently under a great deal of stress. On interview today she says she is feeling much better. Totally denies suicidal ideation. She continues to talk about her anxiety and stress but says that she is able to think about reasonable ways to get it under control. Seems to be very blas about her presentation. No report of any psychotic symptoms. No new physical complaints. Associated Signs/Symptoms: Depression Symptoms:  insomnia, psychomotor retardation, fatigue, difficulty concentrating, hopelessness, (Hypo) Manic Symptoms:  Distractibility, Flight of Ideas, Anxiety Symptoms:  Excessive Worry, Psychotic Symptoms:  None PTSD Symptoms: Negative Total Time spent with patient: 1 hour  Past Psychiatric History: Patient has a past history of impulsive behavior substance abuse chronic anxiety. Currently followed by an outpatient provider who treats her with Xanax and Ambien. Does not take antidepressants. Not currently seeing a therapist. Positive previous admissions to the hospital.  Is the patient at risk to self? Yes.    Has the patient been a risk to self in the past 6 months? Yes.    Has the patient been a  risk to self within the distant past? Yes.    Is the patient a risk to others? No.  Has the patient been a risk to others in the past 6 months? No.  Has the patient been a risk to others within the distant past? No.   Prior Inpatient Therapy:   Prior Outpatient Therapy:    Alcohol Screening: 1. How often do you have a drink containing alcohol?: Never 9. Have you or someone else been injured as a result of your drinking?: No 10. Has a relative or friend or a doctor or another health worker been concerned about your drinking or suggested you cut down?: No Alcohol Use Disorder Identification Test Final Score (AUDIT): 0 Brief Intervention: AUDIT score less than 7 or less-screening does not suggest unhealthy drinking-brief intervention not indicated Substance Abuse History in the last 12 months:  Yes.   Consequences of Substance Abuse: Medical Consequences:  Hospital admission Previous Psychotropic Medications: Yes  Psychological Evaluations: Yes  Past Medical History:  Past Medical History  Diagnosis Date  . Hypertension   . Acid reflux   . HTN (hypertension) 03/20/2015  . Heart murmur   . Thrombus     right lung    Past Surgical History  Procedure Laterality Date  . Abdominal hysterectomy    . Cesarean section Bilateral   . Tonsillectomy    . Cesarean section Bilateral   . Elbow surgery      bilateral  . Thumb surgery      right    Family History: History reviewed. No pertinent family history. Family Psychiatric  History: Positive for anxiety and substance abuse Tobacco Screening: _0 ([phone])::1)@ Social History:  History  Alcohol Use No     History  Drug Use No    Additional Social History: Marital status: Married Number of Years Married: 5 What types of issues is patient dealing with in the relationship?: financial issues  Are you sexually active?: Yes What is your sexual orientation?: Heterosexual  Has your sexual activity been affected by drugs, alcohol,  medication, or emotional stress?: None reported  Does patient have children?: Yes How many children?: 2 How is patient's relationship with their children?: daughter, son; close relationship.                          Allergies:   Allergies  Allergen Reactions  . Honey Bee Venom Anaphylaxis  . Sulfacetamide Sodium Hives and Swelling  . Levofloxacin Nausea Only and Rash  . Sulfa Antibiotics Hives and Swelling  . Codeine Rash   Lab Results:  Results for orders placed or performed during the hospital encounter of 03/26/16 (from the past 48 hour(s))  Comprehensive metabolic panel     Status: Abnormal   Collection Time: 03/26/16  3:38 PM  Result Value Ref Range   Sodium 138 135 - 145 mmol/L   Potassium 3.9 3.5 - 5.1 mmol/L   Chloride 108 101 - 111 mmol/L   CO2 24 22 - 32 mmol/L   Glucose, Bld 101 (H) 65 - 99 mg/dL   BUN 9 6 - 20 mg/dL   Creatinine, Ser 0.85 0.44 - 1.00 mg/dL   Calcium 9.3 8.9 - 10.3 mg/dL   Total Protein 7.3 6.5 - 8.1 g/dL   Albumin 4.2 3.5 - 5.0 g/dL   AST 21 15 - 41 U/L   ALT 28 14 - 54 U/L   Alkaline Phosphatase 56 38 - 126 U/L   Total Bilirubin 0.3 0.3 - 1.2 mg/dL   GFR calc non Af Amer >60 >60 mL/min   GFR calc Af Amer >60 >60 mL/min    Comment: (NOTE) The eGFR has been calculated using the CKD EPI equation. This calculation has not been validated in all clinical situations. eGFR's persistently <60 mL/min signify possible Chronic Kidney Disease.    Anion gap 6 5 - 15  Ethanol     Status: Abnormal   Collection Time: 03/26/16  3:38 PM  Result Value Ref Range   Alcohol, Ethyl (B) 7 (H) <5 mg/dL    Comment:        LOWEST DETECTABLE LIMIT FOR SERUM ALCOHOL IS 5 mg/dL FOR MEDICAL PURPOSES ONLY   Salicylate level     Status: None   Collection Time: 03/26/16  3:38 PM  Result Value Ref Range   Salicylate Lvl 4.5 2.8 - 30.0 mg/dL  Acetaminophen level     Status: Abnormal   Collection Time: 03/26/16  3:38 PM  Result Value Ref Range    Acetaminophen (Tylenol), Serum <10 (L) 10 - 30 ug/mL    Comment:        THERAPEUTIC CONCENTRATIONS VARY SIGNIFICANTLY. A RANGE OF 10-30 ug/mL MAY BE AN EFFECTIVE CONCENTRATION FOR MANY PATIENTS. HOWEVER, SOME ARE BEST TREATED AT CONCENTRATIONS OUTSIDE THIS RANGE. ACETAMINOPHEN CONCENTRATIONS >150 ug/mL AT 4 HOURS AFTER INGESTION AND >50 ug/mL AT 12 HOURS AFTER INGESTION ARE OFTEN ASSOCIATED WITH TOXIC REACTIONS.   cbc     Status: None   Collection Time: 03/26/16  3:38 PM  Result Value Ref Range   WBC 8.3 3.6 - 11.0 K/uL   RBC 4.70 3.80 - 5.20 MIL/uL   Hemoglobin 14.2 12.0 - 16.0 g/dL   HCT 40.8 35.0 -  47.0 %   MCV 86.7 80.0 - 100.0 fL   MCH 30.1 26.0 - 34.0 pg   MCHC 34.7 32.0 - 36.0 g/dL   RDW 33.0 19.7 - 36.1 %   Platelets 310 150 - 440 K/uL  Urine Drug Screen, Qualitative     Status: Abnormal   Collection Time: 03/26/16  3:38 PM  Result Value Ref Range   Tricyclic, Ur Screen POSITIVE (A) NONE DETECTED   Amphetamines, Ur Screen NONE DETECTED NONE DETECTED   MDMA (Ecstasy)Ur Screen NONE DETECTED NONE DETECTED   Cocaine Metabolite,Ur [place] POSITIVE (A) NONE DETECTED   Opiate, Ur Screen NONE DETECTED NONE DETECTED   Phencyclidine (PCP) Ur S NONE DETECTED NONE DETECTED   Cannabinoid 50 Ng, Ur [place] NONE DETECTED NONE DETECTED   Barbiturates, Ur Screen NONE DETECTED NONE DETECTED   Benzodiazepine, Ur Scrn POSITIVE (A) NONE DETECTED   Methadone Scn, Ur NONE DETECTED NONE DETECTED    Comment: (NOTE) 100  Tricyclics, urine               Cutoff 1000 ng/mL 200  Amphetamines, urine             Cutoff 1000 ng/mL 300  MDMA (Ecstasy), urine           Cutoff 500 ng/mL 400  Cocaine Metabolite, urine       Cutoff 300 ng/mL 500  Opiate, urine                   Cutoff 300 ng/mL 600  Phencyclidine (PCP), urine      Cutoff 25 ng/mL 700  Cannabinoid, urine              Cutoff 50 ng/mL 800  Barbiturates, urine             Cutoff 200 ng/mL 900  Benzodiazepine, urine           Cutoff 200  ng/mL 1000 Methadone, urine                Cutoff 300 ng/mL 1100 1200 The urine drug screen provides only a preliminary, unconfirmed 1300 analytical test result and should not be used for non-medical 1400 purposes. Clinical consideration and professional judgment should 1500 be applied to any positive drug screen result due to possible 1600 interfering substances. A more specific alternate chemical method 1700 must be used in order to obtain a confirmed analytical result.  1800 Gas chromato graphy / mass spectrometry (GC/MS) is the preferred 1900 confirmatory method.   Troponin I     Status: None   Collection Time: 03/26/16  3:38 PM  Result Value Ref Range   Troponin I <0.03 <0.031 ng/mL    Comment:        NO INDICATION OF MYOCARDIAL INJURY.     Blood Alcohol level:  Lab Results  Component Value Date   ETH 7* 03/26/2016    Metabolic Disorder Labs:  No results found for: HGBA1C, MPG No results found for: PROLACTIN No results found for: CHOL, TRIG, HDL, CHOLHDL, VLDL, LDLCALC  Current Medications: Current Facility-Administered Medications  Medication Dose Route Frequency Provider Last Rate Last Dose  . acetaminophen (TYLENOL) tablet 650 mg  650 mg Oral Q6H PRN Audery Amel, MD   650 mg at 03/27/16 0027  . ALPRAZolam Prudy Feeler) tablet 1 mg  1 mg Oral TID Audery Amel, MD   1 mg at 03/27/16 0945  . amLODipine (NORVASC) tablet 10 mg  10 mg Oral Daily John T  Clapacs, MD   10 mg at 03/27/16 0945  . fluticasone (FLONASE) 50 MCG/ACT nasal spray 2 spray  2 spray Each Nare Daily Gonzella Lex, MD   2 spray at 03/27/16 0944  . ibuprofen (ADVIL,MOTRIN) tablet 600 mg  600 mg Oral Q6H PRN Gonzella Lex, MD      . magnesium hydroxide (MILK OF MAGNESIA) suspension 30 mL  30 mL Oral Daily PRN Gonzella Lex, MD      . pantoprazole (PROTONIX) EC tablet 40 mg  40 mg Oral QAC breakfast Gonzella Lex, MD   40 mg at 03/27/16 5997  . zolpidem (AMBIEN) tablet 5 mg  5 mg Oral QHS PRN Gonzella Lex, MD   5 mg at 03/27/16 0027   PTA Medications: Prescriptions prior to admission  Medication Sig Dispense Refill Last Dose  . albuterol (VENTOLIN HFA) 108 (90 Base) MCG/ACT inhaler Inhale 1 puff into the lungs every 4 (four) hours as needed. For shortness of breath and/or wheezing.   unknown  . amitriptyline (ELAVIL) 25 MG tablet Take 25 mg by mouth at bedtime.  4 unknown  . amLODipine (NORVASC) 5 MG tablet Take 5 mg by mouth daily.  0 unknown  . budesonide-formoterol (SYMBICORT) 160-4.5 MCG/ACT inhaler Inhale 2 puffs into the lungs 2 (two) times daily.   unknown  . COMBIVENT RESPIMAT 20-100 MCG/ACT AERS respimat Inhale 1 puff into the lungs 4 (four) times daily as needed. For wheezing.  11 unknown  . Erythromycin 2 % PADS Apply 1 each topically 2 (two) times daily. *Apply 1pad to facial rash*  0 unknown  . esomeprazole (NEXIUM) 40 MG capsule Take 40 mg by mouth daily.    unknown  . FLUoxetine (PROZAC) 40 MG capsule Take 40 mg by mouth daily.  4 unknown  . fluticasone (FLONASE) 50 MCG/ACT nasal spray Place 2 sprays into both nostrils daily as needed for rhinitis.    unknown  . furosemide (LASIX) 20 MG tablet Take 20 mg by mouth daily.  11 unknown  . gabapentin (NEURONTIN) 300 MG capsule Take 300 mg by mouth 3 (three) times daily.  0 unknown  . hydrOXYzine (VISTARIL) 50 MG capsule Take 50 mg by mouth See admin instructions. Take 1 capsule by mouth 3 to 4 times a day as needed.  2 unknown  . levothyroxine (SYNTHROID, LEVOTHROID) 25 MCG tablet Take 25 mcg by mouth daily before breakfast.  11 unknown  . lisinopril (PRINIVIL,ZESTRIL) 10 MG tablet Take 10 mg by mouth daily.   unknown  . omega-3 acid ethyl esters (LOVAZA) 1 g capsule Take 2 g by mouth daily.  0 unknown  . SYMBICORT 160-4.5 MCG/ACT inhaler Inhale 2 puffs into the lungs 2 (two) times daily.  12 unknown  . terbinafine (LAMISIL) 250 MG tablet Take 250 mg by mouth daily. For toenail fungal infection.  2 unknown  . Vitamin D,  Ergocalciferol, (DRISDOL) 50000 units CAPS capsule Take 50,000 Units by mouth once a week.  11 unknown  . zolpidem (AMBIEN) 10 MG tablet Take 10 mg by mouth at bedtime as needed for sleep.   unknown    Musculoskeletal: Strength & Muscle Tone: within normal limits Gait & Station: normal Patient leans: N/A  Psychiatric Specialty Exam: Physical Exam  Nursing note and vitals reviewed. Constitutional: She appears well-developed and well-nourished.  HENT:  Head: Normocephalic and atraumatic.  Eyes: Conjunctivae are normal. Pupils are equal, round, and reactive to light.  Neck: Normal range of motion.  Cardiovascular: Normal  rate, regular rhythm and normal heart sounds.   Respiratory: Effort normal and breath sounds normal.  GI: Soft.  Musculoskeletal: Normal range of motion.  Neurological: She is alert.  Skin: Skin is warm and dry.  Psychiatric: She has a normal mood and affect. Her behavior is normal. Judgment and thought content normal.    Review of Systems  Constitutional: Negative.   HENT: Negative.   Eyes: Negative.   Respiratory: Negative.   Cardiovascular: Negative.   Gastrointestinal: Negative.   Musculoskeletal: Negative.   Skin: Negative.   Neurological: Negative.   Psychiatric/Behavioral: Negative for depression, suicidal ideas, hallucinations, memory loss and substance abuse. The patient is nervous/anxious. The patient does not have insomnia.     Blood pressure 127/89, pulse 64, temperature 97.6 F (36.4 C), temperature source Oral, resp. rate 18, height _0  (1.676 m), weight 94.802 kg (209 lb), SpO2 100 %.Body mass index is 33.75 kg/(m^2).  General Appearance: Fairly Groomed  Engineer, water::  Fair  Speech:  Clear and Coherent  Volume:  Normal  Mood:  Euthymic  Affect:  Congruent  Thought Process:  Goal Directed  Orientation:  Full (Time, Place, and Person)  Thought Content:  Negative  Suicidal Thoughts:  No  Homicidal Thoughts:  No  Memory:  Immediate;    Good Recent;   Good Remote;   Good  Judgement:  Impaired  Insight:  Shallow  Psychomotor Activity:  Decreased  Concentration:  Fair  Recall:  [employer] of Knowledge:Fair  Language: Fair  Akathisia:  No  Handed:  Right  AIMS (if indicated):     Assets:  Desire for Improvement Housing Resilience  ADL's:  Intact  Cognition: WNL  Sleep:  Number of Hours: 4.5     Treatment Plan Summary: Daily contact with patient to assess and evaluate symptoms and progress in treatment, Medication management and Plan Patient admitted to the psychiatric hospital. Continuous observation. Treatment for blood pressure. Monitoring for suicidal ideation. Engage in groups and activities. Patient Levi Aland has an outpatient provider keeping her on continuous medications I have continued her anxiety medicines. Patient is not interested in adding any medicine.  Observation Level/Precautions:  Continuous Observation  Laboratory:  UDS  Psychotherapy:  Daily individual and group therapy   Medications:  Current medicines just replicating more or less which she takes outpatient   Consultations:  None needed   Discharge Concerns:  Outpatient follow-up   Estimated LOS:1-2 days   Other:     I certify that inpatient services furnished can reasonably be expected to improve the patient's condition.    Alethia Berthold, MD 5/14/20172:34 PM

## 2016-03-27 NOTE — [facility] Group Notes (Signed)
[facility] Group Notes:  (Nursing/MHT/Case Management/Adjunct)  Date:  03/27/2016  Time:  10:26 AM  Type of Therapy:  goal setting  Participation Level:  Active  Participation Quality:  Appropriate and Attentive  Affect:  Appropriate and Blunted  Cognitive:  Appropriate  Insight:  Appropriate  Engagement in Group:  Supportive  Modes of Intervention:  goal setting  Summary of Progress/Problems:  Rebecca Ramos 03/27/2016, 10:26 AM

## 2016-03-28 DIAGNOSIS — F1420 Cocaine dependence, uncomplicated: Secondary | ICD-10-CM

## 2016-03-28 MED ORDER — ZOLPIDEM TARTRATE 5 MG PO TABS
5.0000 mg | ORAL_TABLET | Freq: Once | ORAL | Status: AC
  Administered 2016-03-28: 5 mg via ORAL
  Filled 2016-03-28: qty 1

## 2016-03-28 MED ORDER — CLONAZEPAM 1 MG PO TABS
1.0000 mg | ORAL_TABLET | Freq: Three times a day (TID) | ORAL | Status: DC
  Administered 2016-03-28 – 2016-03-29 (×5): 1 mg via ORAL
  Filled 2016-03-28 (×6): qty 1

## 2016-03-28 MED ORDER — KETOROLAC TROMETHAMINE 10 MG PO TABS
10.0000 mg | ORAL_TABLET | Freq: Once | ORAL | Status: AC
  Administered 2016-03-28: 10 mg via ORAL
  Filled 2016-03-28: qty 1

## 2016-03-28 NOTE — Progress Notes (Addendum)
[facility] MD Progress Note  03/28/2016 1:01 PM Rebecca Ramos  MRN:  [id number] Subjective:  Patient reports doing better. She tells me she did not plan to kill herself by overdosing on large amounts of lisinopril and Ambien. Patient stated that she was trying to go to take a nap. She is unable to explain to me as the result why she took 40 tablets of her blood pressure medication in order to take a nap.  Patientis under a lot of stress after her husband fell from the roof of the house and has not become disabled. Therefore this took place a couple months ago. He is not unable to bringing any income and they are struggling.  Patient is having also a lot of trouble with her lender.  She purchased a house from a woman who is also her lender.  Patient states that the house has had a multitude of problems in her lender is not doing what she is supposed to in order to fix them.  Patient is states that actually her husband fell from the roof which he was trying to fix as it was licking.  Patient says she is not receiving any therapy and she is not able to talk about her problems with anybody as she does not want to burden her husband.   Principal Problem: Severe recurrent major depression without psychotic features ([place]) Diagnosis:   Patient Active Problem List   Diagnosis Date Noted  . Cocaine use disorder, moderate, dependence ([place]) [F14.20] 03/28/2016  . Severe recurrent major depression without psychotic features ([place]) [F33.2] 03/26/2016  . Suicide attempt ([place]) [T14.91] 03/26/2016  . Chronic back pain [M54.9, G89.29] 03/26/2016  . HTN (hypertension) [I10] 03/20/2015   Total Time spent with patient: 30 minutes  Past Psychiatric History:   Past Medical History:  Past Medical History  Diagnosis Date  . Hypertension   . Acid reflux   . HTN (hypertension) 03/20/2015  . Heart murmur   . Thrombus     right lung    Past Surgical History  Procedure Laterality Date  . Abdominal hysterectomy    .  Cesarean section Bilateral   . Tonsillectomy    . Cesarean section Bilateral   . Elbow surgery      bilateral  . Thumb surgery      right    Family History: History reviewed. No pertinent family history. Family Psychiatric  History:  Social History:  History  Alcohol Use No     History  Drug Use No    Social History   Social History  . Marital Status: Single    Spouse Name: N/A  . Number of Children: N/A  . Years of Education: N/A   Social History Main Topics  . Smoking status: Former Research scientist (life sciences)  . Smokeless tobacco: None  . Alcohol Use: No  . Drug Use: No  . Sexual Activity: Yes    Birth Control/ Protection: Surgical   Other Topics Concern  . None   Social History Narrative   Current Medications: Current Facility-Administered Medications  Medication Dose Route Frequency Provider Last Rate Last Dose  . acetaminophen (TYLENOL) tablet 650 mg  650 mg Oral Q6H PRN Gonzella Lex, MD   650 mg at 03/27/16 0027  . amLODipine (NORVASC) tablet 10 mg  10 mg Oral Daily Gonzella Lex, MD   10 mg at 03/28/16 1049  . clonazePAM (KLONOPIN) tablet 1 mg  1 mg Oral TID WC & HS Hildred Priest, MD      .  fluticasone (FLONASE) 50 MCG/ACT nasal spray 2 spray  2 spray Each Nare Daily Gonzella Lex, MD   2 spray at 03/28/16 1049  . ibuprofen (ADVIL,MOTRIN) tablet 600 mg  600 mg Oral Q6H PRN Gonzella Lex, MD      . magnesium hydroxide (MILK OF MAGNESIA) suspension 30 mL  30 mL Oral Daily PRN Gonzella Lex, MD      . pantoprazole (PROTONIX) EC tablet 40 mg  40 mg Oral QAC breakfast Gonzella Lex, MD   40 mg at 03/28/16 1049    Lab Results:  Results for orders placed or performed during the hospital encounter of 03/26/16 (from the past 48 hour(s))  Comprehensive metabolic panel     Status: Abnormal   Collection Time: 03/26/16  3:38 PM  Result Value Ref Range   Sodium 138 135 - 145 mmol/L   Potassium 3.9 3.5 - 5.1 mmol/L   Chloride 108 101 - 111 mmol/L   CO2 24 22 - 32  mmol/L   Glucose, Bld 101 (H) 65 - 99 mg/dL   BUN 9 6 - 20 mg/dL   Creatinine, Ser 0.85 0.44 - 1.00 mg/dL   Calcium 9.3 8.9 - 10.3 mg/dL   Total Protein 7.3 6.5 - 8.1 g/dL   Albumin 4.2 3.5 - 5.0 g/dL   AST 21 15 - 41 U/L   ALT 28 14 - 54 U/L   Alkaline Phosphatase 56 38 - 126 U/L   Total Bilirubin 0.3 0.3 - 1.2 mg/dL   GFR calc non Af Amer >60 >60 mL/min   GFR calc Af Amer >60 >60 mL/min    Comment: (NOTE) The eGFR has been calculated using the CKD EPI equation. This calculation has not been validated in all clinical situations. eGFR's persistently <60 mL/min signify possible Chronic Kidney Disease.    Anion gap 6 5 - 15  Ethanol     Status: Abnormal   Collection Time: 03/26/16  3:38 PM  Result Value Ref Range   Alcohol, Ethyl (B) 7 (H) <5 mg/dL    Comment:        LOWEST DETECTABLE LIMIT FOR SERUM ALCOHOL IS 5 mg/dL FOR MEDICAL PURPOSES ONLY   Salicylate level     Status: None   Collection Time: 03/26/16  3:38 PM  Result Value Ref Range   Salicylate Lvl 4.5 2.8 - 30.0 mg/dL  Acetaminophen level     Status: Abnormal   Collection Time: 03/26/16  3:38 PM  Result Value Ref Range   Acetaminophen (Tylenol), Serum <10 (L) 10 - 30 ug/mL    Comment:        THERAPEUTIC CONCENTRATIONS VARY SIGNIFICANTLY. A RANGE OF 10-30 ug/mL MAY BE AN EFFECTIVE CONCENTRATION FOR MANY PATIENTS. HOWEVER, SOME ARE BEST TREATED AT CONCENTRATIONS OUTSIDE THIS RANGE. ACETAMINOPHEN CONCENTRATIONS >150 ug/mL AT 4 HOURS AFTER INGESTION AND >50 ug/mL AT 12 HOURS AFTER INGESTION ARE OFTEN ASSOCIATED WITH TOXIC REACTIONS.   cbc     Status: None   Collection Time: 03/26/16  3:38 PM  Result Value Ref Range   WBC 8.3 3.6 - 11.0 K/uL   RBC 4.70 3.80 - 5.20 MIL/uL   Hemoglobin 14.2 12.0 - 16.0 g/dL   HCT 40.8 35.0 - 47.0 %   MCV 86.7 80.0 - 100.0 fL   MCH 30.1 26.0 - 34.0 pg   MCHC 34.7 32.0 - 36.0 g/dL   RDW 13.4 11.5 - 14.5 %   Platelets 310 150 - 440 K/uL  Urine Drug Screen,  Qualitative      Status: Abnormal   Collection Time: 03/26/16  3:38 PM  Result Value Ref Range   Tricyclic, Ur Screen POSITIVE (A) NONE DETECTED   Amphetamines, Ur Screen NONE DETECTED NONE DETECTED   MDMA (Ecstasy)Ur Screen NONE DETECTED NONE DETECTED   Cocaine Metabolite,Ur [place] POSITIVE (A) NONE DETECTED   Opiate, Ur Screen NONE DETECTED NONE DETECTED   Phencyclidine (PCP) Ur S NONE DETECTED NONE DETECTED   Cannabinoid 50 Ng, Ur [place] NONE DETECTED NONE DETECTED   Barbiturates, Ur Screen NONE DETECTED NONE DETECTED   Benzodiazepine, Ur Scrn POSITIVE (A) NONE DETECTED   Methadone Scn, Ur NONE DETECTED NONE DETECTED    Comment: (NOTE) 545  Tricyclics, urine               Cutoff 1000 ng/mL 200  Amphetamines, urine             Cutoff 1000 ng/mL 300  MDMA (Ecstasy), urine           Cutoff 500 ng/mL 400  Cocaine Metabolite, urine       Cutoff 300 ng/mL 500  Opiate, urine                   Cutoff 300 ng/mL 600  Phencyclidine (PCP), urine      Cutoff 25 ng/mL 700  Cannabinoid, urine              Cutoff 50 ng/mL 800  Barbiturates, urine             Cutoff 200 ng/mL 900  Benzodiazepine, urine           Cutoff 200 ng/mL 1000 Methadone, urine                Cutoff 300 ng/mL 1100 1200 The urine drug screen provides only a preliminary, unconfirmed 1300 analytical test result and should not be used for non-medical 1400 purposes. Clinical consideration and professional judgment should 1500 be applied to any positive drug screen result due to possible 1600 interfering substances. A more specific alternate chemical method 1700 must be used in order to obtain a confirmed analytical result.  1800 Gas chromato graphy / mass spectrometry (GC/MS) is the preferred 1900 confirmatory method.   Troponin I     Status: None   Collection Time: 03/26/16  3:38 PM  Result Value Ref Range   Troponin I <0.03 <0.031 ng/mL    Comment:        NO INDICATION OF MYOCARDIAL INJURY.     Blood Alcohol level:  Lab Results  Component  Value Date   ETH 7* 03/26/2016     Musculoskeletal: Strength & Muscle Tone: within normal limits Gait & Station: normal Patient leans: N/A  Psychiatric Specialty Exam: Review of Systems  Constitutional: Negative.   HENT: Negative.   Eyes: Negative.   Respiratory: Negative.   Cardiovascular: Negative.   Gastrointestinal: Negative.   Genitourinary: Negative.   Musculoskeletal: Negative.   Skin: Negative.   Neurological: Negative.   Endo/Heme/Allergies: Negative.   Psychiatric/Behavioral: Positive for depression. The patient is nervous/anxious.     Blood pressure 112/61, pulse 62, temperature 98.1 F (36.7 C), temperature source Oral, resp. rate 18, height '5\' 6"'  (1.676 m), weight 94.802 kg (209 lb), SpO2 100 %.Body mass index is 33.75 kg/(m^2).  General Appearance: Well Groomed  Engineer, water::  Good  Speech:  Clear and Coherent  Volume:  Normal  Mood:  Anxious and Dysphoric  Affect:  Congruent  Thought Process:  Linear  Orientation:  Full (Time, Place, and Person)  Thought Content:  Hallucinations: None  Suicidal Thoughts:  No  Homicidal Thoughts:  No  Memory:  Immediate;   Good Recent;   Good Remote;   Good  Judgement:  Fair  Insight:  Shallow  Psychomotor Activity:  Normal  Concentration:  Good  Recall:  Good  Fund of Knowledge:Good  Language: Good  Akathisia:  No  Handed:    AIMS (if indicated):     Assets:  Agricultural consultant Housing Physical Health  ADL's:  Intact  Cognition: WNL  Sleep:  Number of Hours: 6.25   Treatment Plan Summary:  -MDD: Patient states that her outpatient psychiatry had recently started him on fluoxetine 40 mg. She says that she had only taking the Prozac for a couple of weeks.  I plan to restart her on fluoxetine however I will decrease the dose to 20 mg.  -Chronic use of benzodiazepines: Patient states she has been lying to him a large dose Xanax for more than 20 years.  She does feel she is having  withdrawals at times at sometimes she forgets to take the Xanax which is prescribed to her 3 times a day. She is okay with switching the Xanax to an equivalent dose of a longer acting benzodiazepine.  I will start her on clonazepam 1 mg by mouth 3 times a day.\  -Cocaine use disorder: u tox+ for cocaine.  Will need to have substance abuse therapy upon discharge.  Also consider tapering off benzos.  -Alchol level at admission >7. Again pt is drinking and mixing medication with alcohol and cocaine.    -Hypertension: Continue amlodipine 10 mg by mouth daily   -Allergies continue Flonase daily.  -GERD: Continue Protonix 40 mg daily  -Chronic pain: Continue Tylenol when necessary and ibuprofen when necessary.  Precautions every 15 minute checks  Diet low sodium  Hospitalization and status continue involuntary commitment  Discharge follow-up she'll continue to follow-up with [facility]  Discharge disposition she'll discharge back to her home once stable.   Hildred Priest, MD 03/28/2016, 1:01 PM

## 2016-03-28 NOTE — Plan of Care (Signed)
Problem: [facility] Education Outcome: Progressing Patient receptive to education regarding her diagnosis [place] RN

## 2016-03-28 NOTE — Progress Notes (Signed)
Recreation Therapy Notes  Date: 05.15.17 Time: 1:00 pm Location: Craft Room  Group Topic: Wellness  Goal Area(s) Addresses:  Patient will identify at least one item per dimension of health. Patient will examine areas they are deficient in.  Behavioral Response: Attentive, Interactive  Intervention: 6 Dimensions of Health  Activity: Patients were given a definition sheet of the 6 dimensions of health. Patients were given a worksheet with the dimensions of health and instructed to write 2-3 things per each category.  Education: LRT educated patients on how they can improve each dimension of health.  Education Outcome: In group clarification offered   Clinical Observations/Feedback: Patient completed activity by writing at least 1 item in each category. Patient contributed to group discussion by stating things she can do to improve certain areas.  Jacquelynn CreeGreene,Genea Rheaume M, LRT/CTRS 03/28/2016 3:54 PM

## 2016-03-28 NOTE — Progress Notes (Signed)
Denies SI/HI/AVH.   Denies depression.  Affect bright. Laughs easily.  Visible in milieu.  Support and encouragement offered.  Safety maintained.

## 2016-03-28 NOTE — [facility] Group Notes (Signed)
[facility] Group Notes:  (Nursing/MHT/Case Management/Adjunct)  Date:  03/28/2016  Time:  4:15 PM  Type of Therapy:  Psychoeducational Skills  Participation Level:  Active  Participation Quality:  Appropriate, Attentive and Supportive  Affect:  Appropriate  Cognitive:  Appropriate  Insight:  Appropriate  Engagement in Group:  Supportive  Modes of Intervention:  Discussion and Education  Summary of Progress/Problems:  Twanna Hymanda C Terresa Marlett 03/28/2016, 4:15 PM

## 2016-03-28 NOTE — Plan of Care (Signed)
Problem: Ineffective individual coping Goal: LTG: Patient will report a decrease in negative feelings Outcome: Progressing Patient able to report negative feelings on this shift to nurse [place] RN

## 2016-03-28 NOTE — Progress Notes (Signed)
Recreation Therapy Notes  INPATIENT RECREATION THERAPY ASSESSMENT  Patient Details Name: Rebecca Ramos MRN: [id number] DOB: [birth year] Today's Date: 03/28/2016  Patient Stressors: Family, Death, Other (Comment) (Doesn't talk to sisters; son in [employer] and [employer]; sister-in-law died a couple years ago on Christmas - still struggles with it when she sees her nephews; brother died on her birthday last year - didn't know until after he was cremated; law suit with husband )falling and getting hurt; no transportation; finances; hard to watch the one she loves be in pain and she can't do anything about it.  Coping Skills:   Avoidance, Art/Dance, Music, Sports, Other (Comment) (Try to reason situation out, gardening, stay outside)  Personal Challenges: Anger, Communication, Concentration, Expressing Yourself, Self-Esteem/Confidence, Stress Management, Time Management, Trusting Others  Leisure Interests (2+):  Individual - Other (Comment) (Gardening, play music and dance with granddaughter)  Awareness of Community Resources:  Yes  Community Resources:  Park  Current Use: Yes  If no, Barriers?:    Patient Strengths:  Compassion for others  Patient Identified Areas of Improvement:  A lot but doesn't know specifically what  Current Recreation Participation:  swimming, go to the park  Patient Goal for Hospitalization:  To get better and be able to vent, but not feel like she has too much she can't control  [place]idence[place]   Current SI (including self-harm):  No  Current HI:  No  Consent to Intern Participation: N/A   Jacquelynn CreeGreene,Miachel Nardelli M, LRT/CTRS 03/28/2016, 5:08 PM

## 2016-03-28 NOTE — Progress Notes (Signed)
D: Patient is alert and oriented on the unit this shift. Patient attended and actively participated in groups today. Patient denies suicidal ideation, homicidal ideation, auditory or visual hallucinations at the present time.  A: Scheduled medications are administered to patient as per MD orders. Emotional support and encouragement are provided. Patient is maintained on q.15 minute safety checks. Patient is informed to notify staff with questions or concerns. R: No adverse medication reactions are noted. Patient is cooperative with medication administration and treatment plan today. Patient is receptive, anxious and cooperative on the unit at this time. Patient interacts well with others on the unit this shift. Patient contracts for safety at this time. Patient remains safe at this time.  

## 2016-03-29 MED ORDER — CLONAZEPAM 1 MG PO TABS: 1.0000 mg | ORAL_TABLET | Freq: Three times a day (TID) | ORAL | Status: DC

## 2016-03-29 MED ORDER — FLUOXETINE HCL 20 MG PO CAPS: 20.0000 mg | ORAL_CAPSULE | Freq: Every day | ORAL | Status: DC

## 2016-03-29 NOTE — Discharge Summary (Addendum)
Physician Discharge Summary Note  Patient:  Rebecca Ramos is an 55 y.o., female MRN:  [id number] DOB:  12/20/1960 Patient phone:  [phone] (home)  Patient address:   [address] [place] [address],  Total Time spent with patient: 30 minutes  Date of Admission:  03/26/2016 Date of Discharge: 03/29/16  Reason for Admission:  Overdose on Ambien, lisinopril, cocaine and alcohol  Principal Problem: Severe recurrent major depression without psychotic features [facility]) Discharge Diagnoses: Patient Active Problem List   Diagnosis Date Noted  . Cocaine use disorder, moderate, dependence ([place]) [F14.20] 03/28/2016  . Severe recurrent major depression without psychotic features ([place]) [F33.2] 03/26/2016  . Suicide attempt ([place]) [T14.91] 03/26/2016  . Chronic back pain [M54.9, G89.29] 03/26/2016  . HTN (hypertension) [I10] 03/20/2015    History of Present Illness:: Patient is a 55 year old woman presented to the emergency room after overdose on Ambien and lisinopril. Appeared to be likely with self injury intent. Recently under a great deal of stress. On interview today she says she is feeling much better. Totally denies suicidal ideation. She continues to talk about her anxiety and stress but says that she is able to think about reasonable ways to get it under control. Seems to be very blas about her presentation. No report of any psychotic symptoms. No new physical complaints. Associated Signs/Symptoms: Depression Symptoms: insomnia, psychomotor retardation, fatigue, difficulty concentrating, hopelessness, (Hypo) Manic Symptoms: Distractibility, Flight of Ideas, Anxiety Symptoms: Excessive Worry, Psychotic Symptoms: None PTSD Symptoms: Negative Total Time spent with patient: 1 hour  Past Psychiatric History: Patient has a past history of impulsive behavior substance abuse chronic anxiety. Currently followed by an outpatient provider who treats her with Xanax and Ambien. Does not  take antidepressants. Not currently seeing a therapist. Positive previous admissions to the hospital.  Past Medical History:  Past Medical History  Diagnosis Date  . Hypertension   . Acid reflux   . HTN (hypertension) 03/20/2015  . Heart murmur   . Thrombus     right lung    Past Surgical History  Procedure Laterality Date  . Abdominal hysterectomy    . Cesarean section Bilateral   . Tonsillectomy    . Cesarean section Bilateral   . Elbow surgery      bilateral  . Thumb surgery      right    Family History: History reviewed. No pertinent family history.  Family Psychiatric  History:   Social History:  History  Alcohol Use No     History  Drug Use No    Social History   Social History  . Marital Status: Single    Spouse Name: N/A  . Number of Children: N/A  . Years of Education: N/A   Social History Main Topics  . Smoking status: Former Research scientist (life sciences)  . Smokeless tobacco: None  . Alcohol Use: No  . Drug Use: No  . Sexual Activity: Yes    Birth Control/ Protection: Surgical   Other Topics Concern  . None   Social History Narrative    Hospital Course:    -MDD: Patient states that her outpatient psychiatry had recently started him on fluoxetine 40 mg. She says that she had only taking the Prozac for a couple of weeks. I plan to restart her on fluoxetine however I will decrease the dose to 20 mg.  -Chronic use of benzodiazepines: Patient states she has been lying to him a large dose Xanax for more than 20 years. She does feel she is having withdrawals  at times at sometimes she forgets to take the Xanax which is prescribed to her 3 times a day. She is okay with switching the Xanax to an equivalent dose of a longer acting benzodiazepine.She has been started on clonazepam 1 mg every 8 hours. This medication will need to be tapered off by her outpatient psychiatrist. This patient is not an ideal patient for long-term use of benzodiazepines.  She will be given 60 tablets  of 1 mg clonazepam which is not for 2 weeks. Patient needs to follow-up with Dr. Collie Siad in about 1 week  -Cocaine use disorder: u tox+ for cocaine. Will need to have substance abuse therapy upon discharge. Klonopin will need to be taper off as patient is mixing prescription medications along with alcohol and cocaine which can be quite dangerous.  -Alchol level at admission >7. Again pt is drinking and mixing medication with alcohol and cocaine.   -Hypertension: Continue amlodipine 5 mg and lisinopril 10 mg  -Allergies continue Flonase daily.  -GERD: Continue Protonix 40 mg daily  -Chronic pain: Continue Tylenol when necessary and ibuprofen when necessary.  Precautions every 15 minute checks  Diet low sodium  Hospitalization and status continue involuntary commitment  Discharge follow-up she'll continue to follow-up with [facility]  Discharge disposition she'll discharge back to her home today  This hospitalization was uneventful. The patient did not require seclusion, restraints or forced medications. She was, pleasant and cooperative. She did not display any unsafe or disruptive behaviors here in the unit. She participated actively in programming. She comply with unit regulations and with medications. She had appropriate interactions with peers and staff.  On discharge the patient denies suicidality, homicidality, hopelessness, helplessness. Her mood appears euthymic and her affect is brighter. She has been attending groups, participating and interacting with peers. Denies any physical complaints or side effects from medications.    Physical Findings: AIMS: Facial and Oral Movements Muscles of Facial Expression: None, normal Lips and Perioral Area: None, normal Jaw: None, normal Tongue: None, normal,Extremity Movements Upper (arms, wrists, hands, fingers): None, normal Lower (legs, knees, ankles, toes): None, normal, Trunk Movements Neck, shoulders, hips: None, normal,  Overall Severity Severity of abnormal movements (highest score from questions above): None, normal Incapacitation due to abnormal movements: None, normal Patient's awareness of abnormal movements (rate only patient's report): No Awareness, Dental Status Current problems with teeth and/or dentures?: No Does patient usually wear dentures?: No  CIWA:    COWS:     Musculoskeletal: Strength & Muscle Tone: within normal limits Gait & Station: normal Patient leans: N/A  Psychiatric Specialty Exam: Review of Systems  Constitutional: Negative.   HENT: Negative.   Eyes: Negative.   Respiratory: Negative.   Cardiovascular: Negative.   Gastrointestinal: Negative.   Genitourinary: Negative.   Musculoskeletal: Negative.   Skin: Negative.   Neurological: Negative.   Endo/Heme/Allergies: Negative.   Psychiatric/Behavioral: Negative.     Blood pressure 137/79, pulse 69, temperature 98.2 F (36.8 C), temperature source Oral, resp. rate 18, height _0  (1.676 m), weight 94.802 kg (209 lb), SpO2 100 %.Body mass index is 33.75 kg/(m^2).  General Appearance: Well Groomed  Engineer, water::  Good  Speech:  Clear and Coherent  Volume:  Normal  Mood:  Euthymic  Affect:  Appropriate  Thought Process:  Linear  Orientation:  Full (Time, Place, and Person)  Thought Content:  Hallucinations: None  Suicidal Thoughts:  No  Homicidal Thoughts:  No  Memory:  Immediate;   Good Recent;   Good  Remote;   Good  Judgement:  Fair  Insight:  Shallow  Psychomotor Activity:  Normal  Concentration:  Good  Recall:  Good  Fund of Knowledge:Good  Language: Good  Akathisia:  No  Handed:    AIMS (if indicated):     Assets:  Armed forces logistics/support/administrative officer Social Support  ADL's:  Intact  Cognition: WNL  Sleep:  Number of Hours: 5.45   Have you used any form of tobacco in the last 30 days? (Cigarettes, Smokeless Tobacco, Cigars, and/or Pipes): No  Has this patient used any form of tobacco in the last 30 days?  (Cigarettes, Smokeless Tobacco, Cigars, and/or Pipes) Yes, No  Blood Alcohol level:  Lab Results  Component Value Date   [facility] 7* 03/26/2016   Results for INESS, PANGILINAN (MRN [id number]) as of 03/29/2016 08:56  Ref. Range 03/26/2016 15:38  Sodium Latest Ref Range: 135-145 mmol/L 138  Potassium Latest Ref Range: 3.5-5.1 mmol/L 3.9  Chloride Latest Ref Range: 101-111 mmol/L 108  CO2 Latest Ref Range: 22-32 mmol/L 24  BUN Latest Ref Range: 6-20 mg/dL 9  Creatinine Latest Ref Range: 0.44-1.00 mg/dL 0.85  Calcium Latest Ref Range: 8.9-10.3 mg/dL 9.3  EGFR (Non-African Amer.) Latest Ref Range: >60 mL/min >60  EGFR (African American) Latest Ref Range: >60 mL/min >60  Glucose Latest Ref Range: 65-99 mg/dL 101 (H)  Anion gap Latest Ref Range: 5-15  6  Alkaline Phosphatase Latest Ref Range: 38-126 U/L 56  Albumin Latest Ref Range: 3.5-5.0 g/dL 4.2  AST Latest Ref Range: 15-41 U/L 21  ALT Latest Ref Range: 14-54 U/L 28  Total Protein Latest Ref Range: 6.5-8.1 g/dL 7.3  Total Bilirubin Latest Ref Range: 0.3-1.2 mg/dL 0.3  Troponin I Latest Ref Range: <0.031 ng/mL <0.03  WBC Latest Ref Range: 3.6-11.0 K/uL 8.3  RBC Latest Ref Range: 3.80-5.20 MIL/uL 4.70  Hemoglobin Latest Ref Range: 12.0-16.0 g/dL 14.2  HCT Latest Ref Range: 35.0-47.0 % 40.8  MCV Latest Ref Range: 80.0-100.0 fL 86.7  MCH Latest Ref Range: 26.0-34.0 pg 30.1  MCHC Latest Ref Range: 32.0-36.0 g/dL 34.7  RDW Latest Ref Range: 11.5-14.5 % 13.4  Platelets Latest Ref Range: 150-440 K/uL 310  Acetaminophen (Tylenol), S Latest Ref Range: 10-30 ug/mL <67 (L)  Salicylate Lvl Latest Ref Range: 2.8-30.0 mg/dL 4.5  Alcohol, Ethyl (B) Latest Ref Range: <5 mg/dL 7 (H)  Amphetamines, Ur Screen Latest Ref Range: NONE DETECTED  NONE DETECTED  Barbiturates, Ur Screen Latest Ref Range: NONE DETECTED  NONE DETECTED  Benzodiazepine, Ur Scrn Latest Ref Range: NONE DETECTED  POSITIVE (A)  Cocaine Metabolite,Ur [place] Latest Ref Range: NONE DETECTED   POSITIVE (A)  Methadone Scn, Ur Latest Ref Range: NONE DETECTED  NONE DETECTED  MDMA (Ecstasy)Ur Screen Latest Ref Range: NONE DETECTED  NONE DETECTED  Cannabinoid 50 Ng, Ur [place] Latest Ref Range: NONE DETECTED  NONE DETECTED  Opiate, Ur Screen Latest Ref Range: NONE DETECTED  NONE DETECTED  Phencyclidine (PCP) Ur S Latest Ref Range: NONE DETECTED  NONE DETECTED  Tricyclic, Ur Screen Latest Ref Range: NONE DETECTED  POSITIVE (A)   Metabolic Disorder Labs:  No results found for: HGBA1C, MPG No results found for: PROLACTIN No results found for: CHOL, TRIG, HDL, CHOLHDL, VLDL, LDLCALC  See Psychiatric Specialty Exam and Suicide Risk Assessment completed by Attending Physician prior to discharge.  Discharge destination:  Home  Is patient on multiple antipsychotic therapies at discharge:  No   Has Patient had three or more failed trials of antipsychotic monotherapy by  history:  No  Recommended Plan for Multiple Antipsychotic Therapies: NA     Medication List    STOP taking these medications        amitriptyline 25 MG tablet  Commonly known as:  ELAVIL     Erythromycin 2 % Pads     hydrOXYzine 50 MG capsule  Commonly known as:  VISTARIL     omega-3 acid ethyl esters 1 g capsule  Commonly known as:  LOVAZA     terbinafine 250 MG tablet  Commonly known as:  LAMISIL     VENTOLIN HFA 108 (90 Base) MCG/ACT inhaler  Generic drug:  albuterol     zolpidem 10 MG tablet  Commonly known as:  AMBIEN      TAKE these medications      Indication   amLODipine 5 MG tablet  Commonly known as:  NORVASC  Take 5 mg by mouth daily.  Notes to Patient:  HTN      clonazePAM 1 MG tablet  Commonly known as:  KLONOPIN  Take 1 tablet (1 mg total) by mouth 3 (three) times daily.  Notes to Patient:  anxiety      COMBIVENT RESPIMAT 20-100 MCG/ACT Aers respimat  Generic drug:  Ipratropium-Albuterol  Inhale 1 puff into the lungs 4 (four) times daily as needed. For wheezing.  Notes to Patient:   Shortness of breath      esomeprazole 40 MG capsule  Commonly known as:  NEXIUM  Take 40 mg by mouth daily.  Notes to Patient:  GERD      FLUoxetine 20 MG capsule  Commonly known as:  PROZAC  Take 1 capsule (20 mg total) by mouth daily.  Notes to Patient:  depression      fluticasone 50 MCG/ACT nasal spray  Commonly known as:  FLONASE  Place 2 sprays into both nostrils daily as needed for rhinitis.  Notes to Patient:  allergies      furosemide 20 MG tablet  Commonly known as:  LASIX  Take 20 mg by mouth daily.  Notes to Patient:  edema      gabapentin 300 MG capsule  Commonly known as:  NEURONTIN  Take 300 mg by mouth 3 (three) times daily.  Notes to Patient:  pain      levothyroxine 25 MCG tablet  Commonly known as:  SYNTHROID, LEVOTHROID  Take 25 mcg by mouth daily before breakfast.  Notes to Patient:  hypothyroidism      lisinopril 10 MG tablet  Commonly known as:  PRINIVIL,ZESTRIL  Take 10 mg by mouth daily.  Notes to Patient:  Blood pressure      SYMBICORT 160-4.5 MCG/ACT inhaler  Generic drug:  budesonide-formoterol  Inhale 2 puffs into the lungs 2 (two) times daily.  Notes to Patient:  Shortness of breath      Vitamin D (Ergocalciferol) 50000 units Caps capsule  Commonly known as:  DRISDOL  Take 50,000 Units by mouth once a week.  Notes to Patient:  Vit D deficiency        Follow-up Information    Follow up with [facility]. Go on 04/12/2016.   Why:  1:20pm, , Hospital Follow up, Outpatient Medication Management, Therapy, , Please reschedule if unable to make appointment.   Contact information:   [place] [phone] Fax      Signed: Hildred Priest, MD 03/29/2016, 12:43 PM

## 2016-03-29 NOTE — [facility] Group Notes (Signed)
[facility]   03/29/2016 10:57 AM  Participation Quality:  Patient attended and participated in group introducing herself and sharing her SMART goal is "get medications regulated". Patient received a daily workbook for Tuesday.  Mood/Affect:  Appropriate  Depression Rating:  0  Anxiety Rating:  5  Thoughts of Suicide:  No Will you contract for safety?   NA  Current AVH:  No  Plan for Discharge/Comments:  Home with family and has outpatient provider  Transportation Means: patient has transportation available at discharge  Supports: patient has family support  Lulu RidingIngle, Nuala Chiles T, MSW, LCSW

## 2016-03-29 NOTE — Plan of Care (Signed)
Problem: [facility] Participation in Recreation Therapeutic Interventions Goal: STG-Patient will demonstrate improved self esteem by identif STG: Self-Esteem - Within 3 treatment sessions, patient will verbalize at least 5 positive affirmation statements in one treatment session to increase self-esteem post d/c.  Outcome: Completed/Met Date Met:  03/29/16 Treatment Session 1; Completed 1 out of 1: At approximately 12:15 pm, LRT met with patient in consultation room. Patient verbalized 5 positive affirmation statement. Patient reported it felt "really good". LRT encouraged patient to continue saying positive affirmation statements.  Leonette Monarch, LRT/CTRS 05.16.17 1:22 pm Goal: STG-Other Recreation Therapy Goal (Specify) STG: Stress Management - Within 3 treatment sessions, patient will verbalize understanding of the stress management techniques in one treatment session to increase stress management skills post d/c.  Outcome: Completed/Met Date Met:  03/29/16 Treatment Session 1; Completed 1 out of 1: At approximately 12:15 pm, LRT met with patient in consultation room. LRT educated and provided patient with handouts on stress management techniques. Patient verbalized understanding. LRT encouraged patient to read over and practice the stress management techniques.  Leonette Monarch, LRT/CTRS 05.16.17 1:24 pm

## 2016-03-29 NOTE — Progress Notes (Signed)
Pleasant and cooperative.  Denies SI/HI/AVH.  Rates depression as a 0 and rates anxiety as a 6.  Medication and group compliant. Discharge instructions given, verbalized understanding.  Prescriptions given and personal belongings returned.  Escorted off unit by this Clinical research associatewriter to meet husband to travel home.

## 2016-03-29 NOTE — Progress Notes (Signed)
Recreation Therapy Notes  INPATIENT RECREATION TR PLAN  Patient Details Name: Rebecca Ramos MRN: [id number] DOB: 12-11-1960 Today's Date: 03/29/2016  Rec Therapy Plan Is patient appropriate for Therapeutic Recreation?: Yes Treatment times per week: At least once a week TR Treatment/Interventions: 1:1 session, Group participation (Comment) (Appropriate participation in daily recreational therapy tx)  Discharge Criteria Pt will be discharged from therapy if:: Treatment goals are met, Discharged Treatment plan/goals/alternatives discussed and agreed upon by:: Patient/family  Discharge Summary Short term goals set: See Care Plan Short term goals met: Complete Progress toward goals comments: One-to-one attended Which groups?: Wellness One-to-one attended: Self-esteem, stress management Reason goals not met: N/A Therapeutic equipment acquired: None Reason patient discharged from therapy: Discharge from hospital Pt/family agrees with progress & goals achieved: Yes Date patient discharged from therapy: 03/29/16   Leonette Monarch, LRT/CTRS 03/29/2016, 2:19 PM

## 2016-03-29 NOTE — [facility] Group Notes (Signed)
[facility] LCSW Group Therapy  03/29/2016 8:58 AM  Type of Therapy:  Group Therapy  Participation Level:  Active  Participation Quality:  Appropriate and Attentive  Affect:  Appropriate  Cognitive:  Alert, Appropriate and Oriented  Insight:  Engaged  Engagement in Therapy:  Engaged  Modes of Intervention:  Discussion, Socialization and Support  Summary of Progress/Problems: Patient attended and participated appropriately during group introducing herself and sharing during an introductory exercise that if she were an animal, she would be "a [place] because they are very loyal". Patient was attentive throughout group discussion and participated appropriately in group discussion.   Lulu RidingIngle, Zariana Strub T, MSW, LCSW 03/29/2016, 8:58 AM

## 2016-03-29 NOTE — [facility] Group Notes (Signed)
[facility] LCSW Group Therapy  03/29/2016 11:17 AM  Type of Therapy:  Group Therapy  Participation Level:  Active  Participation Quality:  Appropriate and Attentive  Affect:  Appropriate  Cognitive:  Alert, Appropriate and Oriented  Insight:  Engaged and Supportive  Engagement in Therapy:  Engaged and Supportive  Modes of Intervention:  Discussion, Socialization and Support  Summary of Progress/Problems: Patient attended and participated in group discussion introducing herself and participated appropriately during an introductory exercise "2 Truths and a Lie". Patient shared that she likes to spend time with her grandchildren and looks forward to those positive things which helps her to not fall back to bad habits and behaviors. Patient offered support to other group members who were struggling with relapse in relationships and with substance use.   Lulu RidingIngle, Geofrey Silliman T, MSW, LCSW 03/29/2016, 11:17 AM

## 2016-03-29 NOTE — [facility] Suicide Risk Assessment (Signed)
[facility]   Principal Problem: Severe recurrent major depression without psychotic features [place](HCC) Discharge Diagnoses:  Patient Active Problem List   Diagnosis Date Noted  . Cocaine use disorder, moderate, dependence (HCC) [F14.20] 03/28/2016  . Severe recurrent major depression without psychotic features ([facility]) [F33.2] 03/26/2016  . Suicide attempt ([facility]) [T14.91] 03/26/2016  . Chronic back pain [M54.9, G89.29] 03/26/2016  . HTN (hypertension) [I10] 03/20/2015     Psychiatric Specialty Exam: ROS  Blood pressure 137/79, pulse 69, temperature 98.2 F (36.8 C), temperature source Oral, resp. rate 18, height 5\' 6"  (1.676 m), weight 94.802 kg (209 lb), SpO2 100 %.Body mass index is 33.75 kg/(m^2).                                                       Mental Status Per Nursing Assessment::   On Admission:  NA (Pt denies)  Demographic Factors:  Caucasian  Loss Factors: Financial problems/change in socioeconomic status  Historical Factors: Impulsivity  Risk Reduction Factors:   Sense of responsibility to family  Continued Clinical Symptoms:  Alcohol/Substance Abuse/Dependencies Previous Psychiatric Diagnoses and Treatments  Cognitive Features That Contribute To Risk:  None    Suicide Risk:  Minimal: No identifiable suicidal ideation.  Patients presenting with no risk factors but with morbid ruminations; may be classified as minimal risk based on the severity of the depressive symptoms   Jimmy FootmanHernandez-Gonzalez,  Isami Mehra, MD 03/29/2016, 8:55 AM

## 2016-03-31 NOTE — Progress Notes (Signed)
  [facility] :  Will you be returning to the same living situation after discharge:  Yes,  [facility] At discharge, do you have transportation home?: Yes,  pt will be picked up by family Do you have the ability to pay for your medications: Yes,  pt will be provided with prescriptions at discharge  Release of information consent forms completed and in the chart;  Patient's signature needed at discharge.  Patient to Follow up at: Follow-up Information    [facility] Go on 04/12/2016.   Why:  1:20pm, , Hospital Follow up, Outpatient Medication Management, Therapy, , Please reschedule if unable to make appointment.   Contact information:   [address] [place] [phone] Fax      Next level of care provider has access to [facility] Link:no  Safety Planning and Suicide Prevention discussed: Yes,  completed with pt  Have you used any form of tobacco in the last 30 days? (Cigarettes, Smokeless Tobacco, Cigars, and/or Pipes): No  Has patient been referred to the Quitline?: N/A patient is not a smoker  Patient has been referred for addiction treatment: Pt. refused referral  Mercy RidingJonathan F Kolbee Bogusz 03/31/2016, 12:42 PM

## 2016-03-31 NOTE — Tx Team (Signed)
Pt was admitted after the previous tx team mtg and discharged before the next tx team mtg involving the pt's CSW, thus a tx team mtg note was not needed  Dorothe PeaJonathan F. Aquarius Tremper, LCSWA, LCAS  03/31/16

## 2016-04-01 ENCOUNTER — Ambulatory Visit: Payer: Medicare Other | Admitting: Anesthesiology

## 2016-04-01 ENCOUNTER — Telehealth: Payer: Self-pay | Admitting: *Deleted

## 2016-04-01 NOTE — Telephone Encounter (Signed)
sw pt made her aware that her appt is cancelled to dr. Starling Ramos is no longer seeing new pts. pt is aware that her records is under review with another doctor.Marland Kitchen.Marland Kitchen.TD

## 2016-04-20 DIAGNOSIS — F064 Anxiety disorder due to known physiological condition: Secondary | ICD-10-CM | POA: Diagnosis not present

## 2016-04-20 DIAGNOSIS — Z79899 Other long term (current) drug therapy: Secondary | ICD-10-CM | POA: Diagnosis not present

## 2016-04-20 DIAGNOSIS — F410 Panic disorder [episodic paroxysmal anxiety] without agoraphobia: Secondary | ICD-10-CM | POA: Diagnosis not present

## 2016-04-20 DIAGNOSIS — F331 Major depressive disorder, recurrent, moderate: Secondary | ICD-10-CM | POA: Diagnosis not present

## 2016-04-20 DIAGNOSIS — F4310 Post-traumatic stress disorder, unspecified: Secondary | ICD-10-CM | POA: Diagnosis not present

## 2016-05-02 DIAGNOSIS — Z79899 Other long term (current) drug therapy: Secondary | ICD-10-CM | POA: Diagnosis not present

## 2016-05-02 DIAGNOSIS — F331 Major depressive disorder, recurrent, moderate: Secondary | ICD-10-CM | POA: Diagnosis not present

## 2016-05-02 DIAGNOSIS — F410 Panic disorder [episodic paroxysmal anxiety] without agoraphobia: Secondary | ICD-10-CM | POA: Diagnosis not present

## 2016-05-02 DIAGNOSIS — F064 Anxiety disorder due to known physiological condition: Secondary | ICD-10-CM | POA: Diagnosis not present

## 2016-05-02 DIAGNOSIS — F4310 Post-traumatic stress disorder, unspecified: Secondary | ICD-10-CM | POA: Diagnosis not present

## 2016-05-12 DIAGNOSIS — Z1231 Encounter for screening mammogram for malignant neoplasm of breast: Secondary | ICD-10-CM | POA: Diagnosis not present

## 2016-05-12 DIAGNOSIS — M5030 Other cervical disc degeneration, unspecified cervical region: Secondary | ICD-10-CM | POA: Diagnosis not present

## 2016-05-12 DIAGNOSIS — M7661 Achilles tendinitis, right leg: Secondary | ICD-10-CM | POA: Diagnosis not present

## 2016-05-12 DIAGNOSIS — I10 Essential (primary) hypertension: Secondary | ICD-10-CM | POA: Diagnosis not present

## 2016-05-12 DIAGNOSIS — M47816 Spondylosis without myelopathy or radiculopathy, lumbar region: Secondary | ICD-10-CM | POA: Diagnosis not present

## 2016-05-13 ENCOUNTER — Ambulatory Visit: Payer: Medicare Other | Admitting: Anesthesiology

## 2016-05-18 DIAGNOSIS — F4310 Post-traumatic stress disorder, unspecified: Secondary | ICD-10-CM | POA: Diagnosis not present

## 2016-05-18 DIAGNOSIS — F410 Panic disorder [episodic paroxysmal anxiety] without agoraphobia: Secondary | ICD-10-CM | POA: Diagnosis not present

## 2016-05-18 DIAGNOSIS — F331 Major depressive disorder, recurrent, moderate: Secondary | ICD-10-CM | POA: Diagnosis not present

## 2016-05-18 DIAGNOSIS — F064 Anxiety disorder due to known physiological condition: Secondary | ICD-10-CM | POA: Diagnosis not present

## 2016-05-18 DIAGNOSIS — Z79899 Other long term (current) drug therapy: Secondary | ICD-10-CM | POA: Diagnosis not present

## 2016-05-22 ENCOUNTER — Ambulatory Visit: Payer: Self-pay

## 2016-05-22 ENCOUNTER — Emergency Department
Admission: EM | Admit: 2016-05-22 | Discharge: 2016-05-22 | Discharge status: Against Medical Advice | Payer: Medicare Other | Attending: Emergency Medicine | Admitting: Emergency Medicine

## 2016-05-22 ENCOUNTER — Emergency Department: Payer: Medicare Other

## 2016-05-22 DIAGNOSIS — R079 Chest pain, unspecified: Secondary | ICD-10-CM

## 2016-05-22 DIAGNOSIS — Z87891 Personal history of nicotine dependence: Secondary | ICD-10-CM | POA: Diagnosis not present

## 2016-05-22 DIAGNOSIS — Z79899 Other long term (current) drug therapy: Secondary | ICD-10-CM | POA: Insufficient documentation

## 2016-05-22 DIAGNOSIS — I10 Essential (primary) hypertension: Secondary | ICD-10-CM | POA: Insufficient documentation

## 2016-05-22 DIAGNOSIS — R0602 Shortness of breath: Secondary | ICD-10-CM

## 2016-05-22 DIAGNOSIS — F10120 Alcohol abuse with intoxication, uncomplicated: Secondary | ICD-10-CM | POA: Insufficient documentation

## 2016-05-22 DIAGNOSIS — R0600 Dyspnea, unspecified: Secondary | ICD-10-CM | POA: Diagnosis not present

## 2016-05-22 DIAGNOSIS — F10929 Alcohol use, unspecified with intoxication, unspecified: Secondary | ICD-10-CM

## 2016-05-22 DIAGNOSIS — R062 Wheezing: Secondary | ICD-10-CM | POA: Diagnosis not present

## 2016-05-22 DIAGNOSIS — Z7951 Long term (current) use of inhaled steroids: Secondary | ICD-10-CM | POA: Insufficient documentation

## 2016-05-22 DIAGNOSIS — F332 Major depressive disorder, recurrent severe without psychotic features: Secondary | ICD-10-CM | POA: Diagnosis not present

## 2016-05-22 DIAGNOSIS — R0789 Other chest pain: Secondary | ICD-10-CM | POA: Diagnosis not present

## 2016-05-22 LAB — URINE DRUG SCREEN, QUALITATIVE (ARMC ONLY)
Amphetamines, Ur Screen: NOT DETECTED
BARBITURATES, UR SCREEN: NOT DETECTED
Benzodiazepine, Ur Scrn: POSITIVE — AB
CANNABINOID 50 NG, UR ~~LOC~~: NOT DETECTED
COCAINE METABOLITE, UR ~~LOC~~: NOT DETECTED
MDMA (ECSTASY) UR SCREEN: NOT DETECTED
Methadone Scn, Ur: NOT DETECTED
Opiate, Ur Screen: NOT DETECTED
PHENCYCLIDINE (PCP) UR S: NOT DETECTED
Tricyclic, Ur Screen: POSITIVE — AB

## 2016-05-22 LAB — BASIC METABOLIC PANEL
ANION GAP: 9 (ref 5–15)
BUN: 10 mg/dL (ref 6–20)
CALCIUM: 9.1 mg/dL (ref 8.9–10.3)
CO2: 19 mmol/L — ABNORMAL LOW (ref 22–32)
Chloride: 111 mmol/L (ref 101–111)
Creatinine, Ser: 0.82 mg/dL (ref 0.44–1.00)
GFR calc Af Amer: >60 mL/min (ref >60)
GLUCOSE: 89 mg/dL (ref 65–99)
Potassium: 3.7 mmol/L (ref 3.5–5.1)
SODIUM: 139 mmol/L (ref 135–145)

## 2016-05-22 LAB — CBC
HCT: 39.4 % (ref 35.0–47.0)
Hemoglobin: 13.9 g/dL (ref 12.0–16.0)
MCH: 31.2 pg (ref 26.0–34.0)
MCHC: 35.2 g/dL (ref 32.0–36.0)
MCV: 88.5 fL (ref 80.0–100.0)
PLATELETS: 237 10*3/uL (ref 150–440)
RBC: 4.45 MIL/uL (ref 3.80–5.20)
RDW: 13.6 % (ref 11.5–14.5)
WBC: 8.4 10*3/uL (ref 3.6–11.0)

## 2016-05-22 LAB — BLOOD GAS, VENOUS
ACID-BASE DEFICIT: 4.7 mmol/L — AB (ref 0.0–2.0)
Bicarbonate: 21.1 mEq/L (ref 21.0–28.0)
O2 SAT: 77.6 %
PCO2 VEN: 41 mmHg — AB (ref 44.0–60.0)
PH VEN: 7.32 (ref 7.320–7.430)
PO2 VEN: 46 mmHg — AB (ref 31.0–45.0)
Patient temperature: 37

## 2016-05-22 LAB — TROPONIN I

## 2016-05-22 LAB — BRAIN NATRIURETIC PEPTIDE: B Natriuretic Peptide: 33 pg/mL (ref 0.0–100.0)

## 2016-05-22 LAB — ETHANOL: ALCOHOL ETHYL (B): 169 mg/dL — AB (ref <5)

## 2016-05-22 MED ORDER — IPRATROPIUM-ALBUTEROL 0.5-2.5 (3) MG/3ML IN SOLN
3.0000 mL | Freq: Once | RESPIRATORY_TRACT | Status: AC
  Administered 2016-05-22: 3 mL via RESPIRATORY_TRACT
  Filled 2016-05-22: qty 3

## 2016-05-22 MED ORDER — NITROGLYCERIN 0.4 MG SL SUBL
0.4000 mg | SUBLINGUAL_TABLET | SUBLINGUAL | Status: DC | PRN
  Administered 2016-05-22 (×3): 0.4 mg via SUBLINGUAL
  Filled 2016-05-22: qty 1

## 2016-05-22 NOTE — ED Notes (Addendum)
Pt arrives via ems from home. Pt c/o difficulty breathing, states cough for 4-5 days non productive also reports center chest pain and pressure, states that she doesn't feel good and doesn't feel like she can't sit up high enough to help herself breathe. Pt was given 125mg  of solumedrol, 1 duoneb, 4 baby asa, and 1 inch of nitro paste by ems. Pt was on cpap on arrival to the ER. Ems also reports +etoh today of approx 5 beer

## 2016-05-22 NOTE — ED Notes (Addendum)
CT personnel arrived in pt's room - pt not in room. RN looked for pt in lobby. Pt's name was called - no response

## 2016-05-22 NOTE — ED Provider Notes (Signed)
[facility] Emergency Department Provider Note  ____________________________________________  Time seen: Approximately 5:35 PM  I have reviewed the triage vital signs and the nursing notes.   HISTORY  Chief Complaint Respiratory Distress    HPI Rebecca Ramos is a 55 y.o. female with a history of COPD,HTN, PE after self discontinuation of Coumadin, brought by EMS with shortness of breath. The patient reports that for the past 3 days she has been having a nonproductive cough with wheezing and shortness of breath. She has also had a central and left-sided chest pain that is constant, nonradiating, and pleuritic. She denies any lower extremity swelling, calf pain, fevers, chills, congestion, rhinorrhea, ear pain. EMS reports that upon their arrival, the patient admitted to drinking EtOH today, had O2 sats of 92%, and was having ongoing chest pain so she was given 4 baby aspirins to chew, as well as 1 inch of nitroglycerin paste. She was also given a DuoNeb for decreased breath sounds in the bases and wheezes in the upper lung fields. She was placed on BiPAP and reports feeling better.  She is satt'ing 96% on RA.   Past Medical History  Diagnosis Date  . Hypertension   . Acid reflux   . HTN (hypertension) 03/20/2015  . Heart murmur   . Thrombus     right lung    Patient Active Problem List   Diagnosis Date Noted  . Cocaine use disorder, moderate, dependence (HCC) 03/28/2016  . Severe recurrent major depression without psychotic features (HCC) 03/26/2016  . Suicide attempt (HCC) 03/26/2016  . Chronic back pain 03/26/2016  . HTN (hypertension) 03/20/2015    Past Surgical History  Procedure Laterality Date  . Abdominal hysterectomy    . Cesarean section Bilateral   . Tonsillectomy    . Cesarean section Bilateral   . Elbow surgery      bilateral  . Thumb surgery      right     Current Outpatient Rx  Name  Route  Sig  Dispense  Refill  . amLODipine  (NORVASC) 5 MG tablet   Oral   Take 5 mg by mouth daily.      0   . budesonide-formoterol (SYMBICORT) 160-4.5 MCG/ACT inhaler   Inhalation   Inhale 2 puffs into the lungs 2 (two) times daily.         . clonazePAM (KLONOPIN) 1 MG tablet   Oral   Take 1 tablet (1 mg total) by mouth 3 (three) times daily.   60 tablet   0   . COMBIVENT RESPIMAT 20-100 MCG/ACT AERS respimat   Inhalation   Inhale 1 puff into the lungs 4 (four) times daily as needed. For wheezing.      11     Dispense as written.   Marland Kitchen esomeprazole (NEXIUM) 40 MG capsule   Oral   Take 40 mg by mouth daily.          Marland Kitchen FLUoxetine (PROZAC) 20 MG capsule   Oral   Take 1 capsule (20 mg total) by mouth daily.   30 capsule   0   . fluticasone (FLONASE) 50 MCG/ACT nasal spray   Each Nare   Place 2 sprays into both nostrils daily as needed for rhinitis.          . furosemide (LASIX) 20 MG tablet   Oral   Take 20 mg by mouth daily.      11   . gabapentin (NEURONTIN) 300 MG capsule  Oral   Take 300 mg by mouth 3 (three) times daily.      0   . levothyroxine (SYNTHROID, LEVOTHROID) 25 MCG tablet   Oral   Take 25 mcg by mouth daily before breakfast.      11   . lisinopril (PRINIVIL,ZESTRIL) 10 MG tablet   Oral   Take 10 mg by mouth daily.         . Vitamin D, Ergocalciferol, (DRISDOL) 50000 units CAPS capsule   Oral   Take 50,000 Units by mouth once a week.      11     Allergies Honey bee venom; Sulfacetamide sodium; Levofloxacin; Sulfa antibiotics; and Codeine  No family history on file.  Social History Social History  Substance Use Topics  . Smoking status: Former Games developermoker  . Smokeless tobacco: Not on file  . Alcohol Use: No    Review of Systems Constitutional: No fever/chills.No lightheadedness or syncope. Eyes: No visual changes. No eye discharge. ENT: No sore throat. No congestion or rhinorrhea. Cardiovascular: Positive chest pain. Denies palpitations. Respiratory: Has a  wheezing and shortness of breath.  Positive nonproductive cough. Gastrointestinal: No abdominal pain.  No nausea, no vomiting.  No diarrhea.  No constipation. Genitourinary: Negative for dysuria. Musculoskeletal: Negative for back pain. No lower extremity swelling, calf pain. Skin: Negative for rash. Neurological: Negative for headaches. No focal numbness, tingling or weakness.  Psychiatric:Positive alcohol intoxication 10-point ROS otherwise negative.  ____________________________________________   PHYSICAL EXAM:  VITAL SIGNS: ED Triage Vitals  Enc Vitals Group     BP 05/22/16 1723 119/69 mmHg     Pulse Rate 05/22/16 1723 70     Resp 05/22/16 1723 20     Temp 05/22/16 1723 97.6 F (36.4 C)     Temp Source 05/22/16 1723 Oral     SpO2 05/22/16 1723 96 %     Weight 05/22/16 1723 205 lb (92.987 kg)     Height 05/22/16 1723 5\' 2"  (1.575 m)     Head Cir --      Peak Flow --      Pain Score 05/22/16 1724 8     Pain Loc --      Pain Edu? --      Excl. in GC? --     Constitutional: Alert and oriented. Well appearing and in no acute distress. Answers questions appropriately. Eyes: Conjunctivae are normal.  EOMI. No scleral icterus.No eye discharge. Head: Atraumatic. Nose: No congestion/rhinnorhea. Mouth/Throat: Mucous membranes are moist.  Neck: No stridor.  Supple.  No JVD.  No meningismus. Cardiovascular: Normal rate, regular rhythm. No murmurs, rubs or gallops.  Respiratory: Mild tachypnea with accessory muscle use and retractions. Wheezes expiratory greater than inspiratory diffusely in the lung fields but fairly mild. No rales or rhonchi. Fair air exchange everywhere. O2 sats 96% on room air. Gastrointestinal: Soft, nontender and nondistended.  No guarding or rebound.  No peritoneal signs. Musculoskeletal: No LE edema. No ttp in the calves or palpable cords.  Negative Homan's sign. Neurologic:  A&Ox3.  Speech is clear.  Face and smile are symmetric.  EOMI.  Moves all  extremities well. Skin:  Skin is warm, dry and intact. No rash noted. Psychiatric: Mood and affect are normal. Speech and behavior are normal.  Normal judgement.  ____________________________________________   LABS (all labs ordered are listed, but only abnormal results are displayed)  Labs Reviewed  BASIC METABOLIC PANEL - Abnormal; Notable for the following:    CO2 19 (*)  All other components within normal limits  ETHANOL - Abnormal; Notable for the following:    Alcohol, Ethyl (B) 169 (*)    All other components within normal limits  URINE DRUG SCREEN, QUALITATIVE ([facility] ONLY) - Abnormal; Notable for the following:    Tricyclic, Ur Screen POSITIVE (*)    Benzodiazepine, Ur Scrn POSITIVE (*)    All other components within normal limits  BLOOD GAS, VENOUS - Abnormal; Notable for the following:    pCO2, Ven 41 (*)    pO2, Ven 46.0 (*)    Acid-base deficit 4.7 (*)    All other components within normal limits  CBC  TROPONIN I  BRAIN NATRIURETIC PEPTIDE   ____________________________________________  EKG  ED ECG REPORT I, Rockne Menghini, the attending physician, personally viewed and interpreted this ECG.   Date: 05/22/2016  EKG Time: 1714  Rate: 68  Rhythm: normal sinus rhythm  Axis: Leftward  Intervals:none  ST&T Change: Nonspecific T-wave inversion in V1. No ST elevation.  ____________________________________________  RADIOLOGY  No results found.  ____________________________________________   PROCEDURES  Procedure(s) performed: None  Critical Care performed: No ____________________________________________   INITIAL IMPRESSION / ASSESSMENT AND PLAN / ED COURSE  Pertinent labs & imaging results that were available during my care of the patient were reviewed by me and considered in my medical decision making (see chart for details).  55 y.o. female with a history of COPD, PE not anticoagulated, HTN presenting with shortness of breath, wheezing,  and chest pain. Given the patient's history of PE and self discontinuation of Coumadin, I will plan to get a CT angiogram to rule out recurrent PE. It is possible that the patient is having a COPD exacerbation, and she has been given a DuoNeb and a Medrol by the paramedics. I will give her another breathing treatment to see if we can decrease her wheezing. She is maintaining normal oxygen saturation and we'll get a gas for further evaluation. A cardiac cause for the patient's pain and symptoms as possible, so a get a BNP to evaluate for some heart failure and a troponin. His pain admission.  ----------------------------------------- 8:01 PM on 05/22/2016 -----------------------------------------  The patient's gas was reassuring, she had a normal white blood cell count. Her BNP was normal and her troponin is negative without any ischemic changes on her EKG. Her alcohol level was elevated. I was called to the room because the patient stated that she was going to leave, and I convinced her to stay for her CT angiogram to evaluate for PE given that she self discontinued her Coumadin and was having shortness of breath. When CT came to get her, the patient had eloped.  ____________________________________________  FINAL CLINICAL IMPRESSION(S) / ED DIAGNOSES  Final diagnoses:  Shortness of breath  Wheezing  Chest pain, unspecified chest pain type  Alcohol intoxication, with unspecified complication (HCC)      NEW MEDICATIONS STARTED DURING THIS VISIT:  New Prescriptions   No medications on file     Rockne Menghini, MD 05/22/16 2002

## 2016-05-22 NOTE — ED Notes (Signed)
Pt requesting to leave. MD Norman informSharma Coverted. MD Sharma CovertNorman to bedside. Pt agreed to stay for CT scan.

## 2016-05-22 NOTE — ED Notes (Signed)
Ct could not find pt for ct r/o pe - gown, leads, nasal canula found on bed. Trash searched by charge nurse and this nurse for iv but not found (pt has #18 left ac). Nurse made aware. Last bp scheduled for 1930 did not take so may have left at that time.

## 2016-05-22 NOTE — ED Notes (Signed)
Pt did not return to room. RN again checked lobby. MD Sharma CovertNorman informed pt had left. Officer Lisette Grinderom Meisenbach was asked to send someone to pt's address to check on patient due to the fact that pt still had an peripheral IV in when last seen by RN.

## 2016-06-06 ENCOUNTER — Emergency Department: Payer: Medicare Other

## 2016-06-06 ENCOUNTER — Observation Stay
Admission: EM | Admit: 2016-06-06 | Discharge: 2016-06-08 | Disposition: A | Discharge status: Home / Self Care | Payer: Medicare Other | Attending: Internal Medicine | Admitting: Internal Medicine

## 2016-06-06 ENCOUNTER — Encounter: Payer: Self-pay | Admitting: Emergency Medicine

## 2016-06-06 DIAGNOSIS — J441 Chronic obstructive pulmonary disease with (acute) exacerbation: Secondary | ICD-10-CM | POA: Insufficient documentation

## 2016-06-06 DIAGNOSIS — M549 Dorsalgia, unspecified: Secondary | ICD-10-CM | POA: Insufficient documentation

## 2016-06-06 DIAGNOSIS — Z91030 Bee allergy status: Secondary | ICD-10-CM | POA: Insufficient documentation

## 2016-06-06 DIAGNOSIS — Z882 Allergy status to sulfonamides status: Secondary | ICD-10-CM | POA: Insufficient documentation

## 2016-06-06 DIAGNOSIS — Z800 Family history of malignant neoplasm of digestive organs: Secondary | ICD-10-CM | POA: Insufficient documentation

## 2016-06-06 DIAGNOSIS — R0602 Shortness of breath: Secondary | ICD-10-CM

## 2016-06-06 DIAGNOSIS — Z8249 Family history of ischemic heart disease and other diseases of the circulatory system: Secondary | ICD-10-CM | POA: Insufficient documentation

## 2016-06-06 DIAGNOSIS — F419 Anxiety disorder, unspecified: Secondary | ICD-10-CM | POA: Insufficient documentation

## 2016-06-06 DIAGNOSIS — I10 Essential (primary) hypertension: Secondary | ICD-10-CM | POA: Diagnosis not present

## 2016-06-06 DIAGNOSIS — Z79899 Other long term (current) drug therapy: Secondary | ICD-10-CM | POA: Insufficient documentation

## 2016-06-06 DIAGNOSIS — K219 Gastro-esophageal reflux disease without esophagitis: Secondary | ICD-10-CM | POA: Diagnosis not present

## 2016-06-06 DIAGNOSIS — Z720 Tobacco use: Secondary | ICD-10-CM | POA: Insufficient documentation

## 2016-06-06 DIAGNOSIS — Z7951 Long term (current) use of inhaled steroids: Secondary | ICD-10-CM | POA: Insufficient documentation

## 2016-06-06 DIAGNOSIS — J209 Acute bronchitis, unspecified: Secondary | ICD-10-CM

## 2016-06-06 DIAGNOSIS — Z881 Allergy status to other antibiotic agents status: Secondary | ICD-10-CM | POA: Diagnosis not present

## 2016-06-06 DIAGNOSIS — E669 Obesity, unspecified: Secondary | ICD-10-CM

## 2016-06-06 DIAGNOSIS — Z6841 Body Mass Index (BMI) 40.0 and over, adult: Secondary | ICD-10-CM | POA: Insufficient documentation

## 2016-06-06 DIAGNOSIS — R05 Cough: Secondary | ICD-10-CM

## 2016-06-06 DIAGNOSIS — J440 Chronic obstructive pulmonary disease with acute lower respiratory infection: Secondary | ICD-10-CM | POA: Insufficient documentation

## 2016-06-06 DIAGNOSIS — Z885 Allergy status to narcotic agent status: Secondary | ICD-10-CM | POA: Insufficient documentation

## 2016-06-06 DIAGNOSIS — Z9889 Other specified postprocedural states: Secondary | ICD-10-CM | POA: Diagnosis not present

## 2016-06-06 DIAGNOSIS — R079 Chest pain, unspecified: Secondary | ICD-10-CM | POA: Diagnosis not present

## 2016-06-06 DIAGNOSIS — R000 Tachycardia, unspecified: Secondary | ICD-10-CM | POA: Diagnosis not present

## 2016-06-06 DIAGNOSIS — Z90710 Acquired absence of both cervix and uterus: Secondary | ICD-10-CM | POA: Diagnosis not present

## 2016-06-06 DIAGNOSIS — E785 Hyperlipidemia, unspecified: Secondary | ICD-10-CM | POA: Diagnosis not present

## 2016-06-06 DIAGNOSIS — F418 Other specified anxiety disorders: Secondary | ICD-10-CM | POA: Diagnosis not present

## 2016-06-06 DIAGNOSIS — F1410 Cocaine abuse, uncomplicated: Secondary | ICD-10-CM

## 2016-06-06 DIAGNOSIS — F332 Major depressive disorder, recurrent severe without psychotic features: Secondary | ICD-10-CM | POA: Insufficient documentation

## 2016-06-06 DIAGNOSIS — Z86711 Personal history of pulmonary embolism: Secondary | ICD-10-CM | POA: Diagnosis not present

## 2016-06-06 DIAGNOSIS — F1420 Cocaine dependence, uncomplicated: Secondary | ICD-10-CM | POA: Diagnosis not present

## 2016-06-06 DIAGNOSIS — Z716 Tobacco abuse counseling: Secondary | ICD-10-CM

## 2016-06-06 DIAGNOSIS — R059 Cough, unspecified: Secondary | ICD-10-CM

## 2016-06-06 DIAGNOSIS — R0789 Other chest pain: Secondary | ICD-10-CM | POA: Diagnosis not present

## 2016-06-06 DIAGNOSIS — J449 Chronic obstructive pulmonary disease, unspecified: Secondary | ICD-10-CM

## 2016-06-06 HISTORY — DX: Major depressive disorder, single episode, unspecified: F32.9

## 2016-06-06 HISTORY — DX: Anxiety disorder, unspecified: F41.9

## 2016-06-06 HISTORY — DX: Depression, unspecified: F32.A

## 2016-06-06 LAB — BASIC METABOLIC PANEL
Anion gap: 11 (ref 5–15)
BUN: 11 mg/dL (ref 6–20)
CALCIUM: 9.6 mg/dL (ref 8.9–10.3)
CO2: 19 mmol/L — ABNORMAL LOW (ref 22–32)
Chloride: 105 mmol/L (ref 101–111)
Creatinine, Ser: 0.94 mg/dL (ref 0.44–1.00)
GFR calc Af Amer: >60 mL/min (ref >60)
GLUCOSE: 89 mg/dL (ref 65–99)
Potassium: 3.9 mmol/L (ref 3.5–5.1)
Sodium: 135 mmol/L (ref 135–145)

## 2016-06-06 LAB — CBC
HCT: 42.2 % (ref 35.0–47.0)
Hemoglobin: 14.4 g/dL (ref 12.0–16.0)
MCH: 29.8 pg (ref 26.0–34.0)
MCHC: 34.1 g/dL (ref 32.0–36.0)
MCV: 87.3 fL (ref 80.0–100.0)
PLATELETS: 328 10*3/uL (ref 150–440)
RBC: 4.83 MIL/uL (ref 3.80–5.20)
RDW: 13.5 % (ref 11.5–14.5)
WBC: 7.5 10*3/uL (ref 3.6–11.0)

## 2016-06-06 LAB — BLOOD GAS, VENOUS
Acid-Base Excess: 3 mmol/L (ref 0.0–3.0)
BICARBONATE: 25.7 meq/L (ref 21.0–28.0)
O2 SAT: 68.9 %
PATIENT TEMPERATURE: 37
pCO2, Ven: 33 mmHg — ABNORMAL LOW (ref 44.0–60.0)
pH, Ven: 7.5 — ABNORMAL HIGH (ref 7.320–7.430)
pO2, Ven: 32 mmHg (ref 31.0–45.0)

## 2016-06-06 LAB — TROPONIN I: Troponin I: <0.03 ng/mL (ref <0.03)

## 2016-06-06 LAB — LIPID PANEL
CHOL/HDL RATIO: 5.4 ratio
Cholesterol: 231 mg/dL — ABNORMAL HIGH (ref 0–200)
HDL: 43 mg/dL (ref >40)
LDL Cholesterol: 142 mg/dL — ABNORMAL HIGH (ref 0–99)
Triglycerides: 231 mg/dL — ABNORMAL HIGH (ref <150)
VLDL: 46 mg/dL — AB (ref 0–40)

## 2016-06-06 LAB — URINE DRUG SCREEN, QUALITATIVE (ARMC ONLY)
Amphetamines, Ur Screen: NOT DETECTED
BARBITURATES, UR SCREEN: NOT DETECTED
BENZODIAZEPINE, UR SCRN: POSITIVE — AB
CANNABINOID 50 NG, UR ~~LOC~~: NOT DETECTED
COCAINE METABOLITE, UR ~~LOC~~: POSITIVE — AB
MDMA (Ecstasy)Ur Screen: NOT DETECTED
METHADONE SCREEN, URINE: NOT DETECTED
Opiate, Ur Screen: NOT DETECTED
Phencyclidine (PCP) Ur S: NOT DETECTED
TRICYCLIC, UR SCREEN: POSITIVE — AB

## 2016-06-06 LAB — TSH: TSH: 1.202 u[IU]/mL (ref 0.350–4.500)

## 2016-06-06 LAB — BRAIN NATRIURETIC PEPTIDE: B Natriuretic Peptide: 9 pg/mL (ref 0.0–100.0)

## 2016-06-06 LAB — LACTIC ACID, PLASMA
LACTIC ACID, VENOUS: 2.9 mmol/L — AB (ref 0.5–1.9)
Lactic Acid, Venous: 2.8 mmol/L (ref 0.5–1.9)

## 2016-06-06 MED ORDER — LORAZEPAM 2 MG/ML IJ SOLN
1.0000 mg | Freq: Once | INTRAMUSCULAR | Status: AC
  Administered 2016-06-06: 1 mg via INTRAVENOUS
  Filled 2016-06-06: qty 1

## 2016-06-06 MED ORDER — HYDROCOD POLST-CPM POLST ER 10-8 MG/5ML PO SUER
10.0000 mL | Freq: Two times a day (BID) | ORAL | Status: DC
  Administered 2016-06-06 – 2016-06-07 (×3): 10 mL via ORAL
  Filled 2016-06-06 (×3): qty 10

## 2016-06-06 MED ORDER — IOPAMIDOL (ISOVUE-300) INJECTION 61%: 75.0000 mL | Freq: Once | INTRAVENOUS | Status: DC | PRN

## 2016-06-06 MED ORDER — METHYLPREDNISOLONE SODIUM SUCC 125 MG IJ SOLR
125.0000 mg | Freq: Once | INTRAMUSCULAR | Status: AC
  Administered 2016-06-06: 125 mg via INTRAVENOUS
  Filled 2016-06-06: qty 2

## 2016-06-06 MED ORDER — ALBUTEROL SULFATE (2.5 MG/3ML) 0.083% IN NEBU
2.5000 mg | INHALATION_SOLUTION | Freq: Once | RESPIRATORY_TRACT | Status: AC
  Administered 2016-06-06: 2.5 mg via RESPIRATORY_TRACT
  Filled 2016-06-06: qty 3

## 2016-06-06 MED ORDER — ONDANSETRON HCL 4 MG/2ML IJ SOLN: 4.0000 mg | Freq: Four times a day (QID) | INTRAMUSCULAR | Status: DC | PRN

## 2016-06-06 MED ORDER — METHYLPREDNISOLONE SODIUM SUCC 40 MG IJ SOLR
40.0000 mg | INTRAMUSCULAR | Status: DC
  Administered 2016-06-06 – 2016-06-07 (×2): 40 mg via INTRAVENOUS
  Filled 2016-06-06 (×2): qty 1

## 2016-06-06 MED ORDER — ACETAMINOPHEN 325 MG PO TABS: 650.0000 mg | ORAL_TABLET | Freq: Four times a day (QID) | ORAL | Status: DC | PRN

## 2016-06-06 MED ORDER — SODIUM CHLORIDE 0.9 % IV BOLUS (SEPSIS)
1000.0000 mL | Freq: Once | INTRAVENOUS | Status: AC
Start: 2016-06-06 — End: 2016-06-06
  Administered 2016-06-06: 1000 mL via INTRAVENOUS

## 2016-06-06 MED ORDER — LISINOPRIL 10 MG PO TABS
10.0000 mg | ORAL_TABLET | Freq: Every day | ORAL | Status: DC
  Administered 2016-06-06 – 2016-06-08 (×3): 10 mg via ORAL
  Filled 2016-06-06 (×3): qty 1

## 2016-06-06 MED ORDER — AZITHROMYCIN 500 MG PO TABS
500.0000 mg | ORAL_TABLET | Freq: Every day | ORAL | Status: DC
  Administered 2016-06-06 – 2016-06-08 (×3): 500 mg via ORAL
  Filled 2016-06-06 (×4): qty 1

## 2016-06-06 MED ORDER — ASPIRIN EC 81 MG PO TBEC
81.0000 mg | DELAYED_RELEASE_TABLET | Freq: Every day | ORAL | Status: DC
  Administered 2016-06-06 – 2016-06-08 (×3): 81 mg via ORAL
  Filled 2016-06-06 (×3): qty 1

## 2016-06-06 MED ORDER — IPRATROPIUM-ALBUTEROL 0.5-2.5 (3) MG/3ML IN SOLN
3.0000 mL | RESPIRATORY_TRACT | Status: DC
  Administered 2016-06-06 – 2016-06-07 (×6): 3 mL via RESPIRATORY_TRACT
  Filled 2016-06-06 (×5): qty 3

## 2016-06-06 MED ORDER — FLUTICASONE PROPIONATE 50 MCG/ACT NA SUSP
2.0000 | Freq: Every day | NASAL | Status: DC | PRN
  Filled 2016-06-06: qty 16

## 2016-06-06 MED ORDER — BENZONATATE 100 MG PO CAPS
100.0000 mg | ORAL_CAPSULE | Freq: Three times a day (TID) | ORAL | Status: DC
  Administered 2016-06-06 – 2016-06-08 (×5): 100 mg via ORAL
  Filled 2016-06-06 (×5): qty 1

## 2016-06-06 MED ORDER — ZOLPIDEM TARTRATE 5 MG PO TABS
5.0000 mg | ORAL_TABLET | Freq: Every evening | ORAL | Status: DC | PRN
  Administered 2016-06-06 – 2016-06-07 (×2): 5 mg via ORAL
  Filled 2016-06-06 (×2): qty 1

## 2016-06-06 MED ORDER — IPRATROPIUM BROMIDE 0.02 % IN SOLN
0.5000 mg | Freq: Once | RESPIRATORY_TRACT | Status: AC
  Administered 2016-06-06: 0.5 mg via RESPIRATORY_TRACT
  Filled 2016-06-06: qty 2.5

## 2016-06-06 MED ORDER — VITAMIN D (ERGOCALCIFEROL) 1.25 MG (50000 UNIT) PO CAPS
50000.0000 [IU] | ORAL_CAPSULE | ORAL | Status: DC
  Administered 2016-06-06: 50000 [IU] via ORAL
  Filled 2016-06-06: qty 1

## 2016-06-06 MED ORDER — SODIUM CHLORIDE 0.9% FLUSH
3.0000 mL | Freq: Two times a day (BID) | INTRAVENOUS | Status: DC
  Administered 2016-06-06 – 2016-06-07 (×3): 3 mL via INTRAVENOUS

## 2016-06-06 MED ORDER — ONDANSETRON HCL 4 MG PO TABS: 4.0000 mg | ORAL_TABLET | Freq: Four times a day (QID) | ORAL | Status: DC | PRN

## 2016-06-06 MED ORDER — FLUOXETINE HCL 20 MG PO CAPS
20.0000 mg | ORAL_CAPSULE | Freq: Every day | ORAL | Status: DC
  Administered 2016-06-06 – 2016-06-08 (×3): 20 mg via ORAL
  Filled 2016-06-06 (×3): qty 1

## 2016-06-06 MED ORDER — OXYCODONE-ACETAMINOPHEN 5-325 MG PO TABS
2.0000 | ORAL_TABLET | Freq: Three times a day (TID) | ORAL | Status: DC | PRN
  Administered 2016-06-06 – 2016-06-08 (×6): 2 via ORAL
  Filled 2016-06-06 (×6): qty 2

## 2016-06-06 MED ORDER — IOPAMIDOL (ISOVUE-370) INJECTION 76%
75.0000 mL | Freq: Once | INTRAVENOUS | Status: AC | PRN
  Administered 2016-06-06: 75 mL via INTRAVENOUS

## 2016-06-06 MED ORDER — MOMETASONE FURO-FORMOTEROL FUM 200-5 MCG/ACT IN AERO
2.0000 | INHALATION_SPRAY | Freq: Two times a day (BID) | RESPIRATORY_TRACT | Status: DC
  Administered 2016-06-06 – 2016-06-08 (×4): 2 via RESPIRATORY_TRACT
  Filled 2016-06-06: qty 8.8

## 2016-06-06 MED ORDER — PANTOPRAZOLE SODIUM 40 MG PO TBEC
40.0000 mg | DELAYED_RELEASE_TABLET | Freq: Every day | ORAL | Status: DC
  Administered 2016-06-07 – 2016-06-08 (×2): 40 mg via ORAL
  Filled 2016-06-06 (×2): qty 1

## 2016-06-06 MED ORDER — AMLODIPINE BESYLATE 5 MG PO TABS
5.0000 mg | ORAL_TABLET | Freq: Every day | ORAL | Status: DC
  Administered 2016-06-06 – 2016-06-08 (×3): 5 mg via ORAL
  Filled 2016-06-06 (×3): qty 1

## 2016-06-06 MED ORDER — LEVOTHYROXINE SODIUM 50 MCG PO TABS
25.0000 ug | ORAL_TABLET | Freq: Every day | ORAL | Status: DC
  Administered 2016-06-07 – 2016-06-08 (×2): 25 ug via ORAL
  Filled 2016-06-06: qty 0.5
  Filled 2016-06-06: qty 1

## 2016-06-06 MED ORDER — ALPRAZOLAM 0.25 MG PO TABS
0.5000 mg | ORAL_TABLET | Freq: Three times a day (TID) | ORAL | Status: DC | PRN
  Administered 2016-06-06 – 2016-06-08 (×5): 0.5 mg via ORAL
  Filled 2016-06-06 (×5): qty 2

## 2016-06-06 MED ORDER — ENOXAPARIN SODIUM 40 MG/0.4ML ~~LOC~~ SOLN
40.0000 mg | SUBCUTANEOUS | Status: DC
  Administered 2016-06-06 – 2016-06-07 (×2): 40 mg via SUBCUTANEOUS
  Filled 2016-06-06 (×2): qty 0.4

## 2016-06-06 MED ORDER — GABAPENTIN 300 MG PO CAPS
300.0000 mg | ORAL_CAPSULE | Freq: Three times a day (TID) | ORAL | Status: DC
  Administered 2016-06-06 – 2016-06-08 (×5): 300 mg via ORAL
  Filled 2016-06-06 (×5): qty 1

## 2016-06-06 MED ORDER — ACETAMINOPHEN 650 MG RE SUPP
650.0000 mg | Freq: Four times a day (QID) | RECTAL | Status: DC | PRN
Start: 2016-06-06 — End: 2016-06-08

## 2016-06-06 NOTE — ED Notes (Signed)
Assisted patient up to bathroom with steady.

## 2016-06-06 NOTE — ED Notes (Signed)
Patient ambulated in hallway. Patient SpO2 did not drop, stayed at 97% or higher. Patient did become tachycardiac at 143bpm while walking. When patient stopped walking her heart rate came down to 74bpm. MD notified and aware.

## 2016-06-06 NOTE — ED Triage Notes (Signed)
Pt returns to ED after finishing antibiotic with increased sob and cough. Pt alert & oriented, sitting on side of bed, labored breathing.

## 2016-06-06 NOTE — ED Notes (Signed)
MD at bedside. 

## 2016-06-06 NOTE — ED Notes (Signed)
Dr. Sharma Covert notified of lactic acid.

## 2016-06-06 NOTE — H&P (Signed)
[facility] - [place] at [facility]   PATIENT NAME: Rebecca Ramos    MR#:  [id number]  DATE OF BIRTH:  02/03/1961  DATE OF ADMISSION:  06/06/2016  PRIMARY CARE PHYSICIAN: Filbert Berthold, NP   REQUESTING/REFERRING PHYSICIAN: Dr. Etheleen Nicks  CHIEF COMPLAINT:   Chief Complaint  Patient presents with  . Shortness of Breath    HISTORY OF PRESENT ILLNESS:  Rebecca Ramos  is a 55 y.o. female with a known history of COPD not on home oxygen, hypertension, history of PE currently off of anticoagulation, anxiety and depression presents to the hospital secondary to 2 week history of shortness of breath and cough. Patient was here in the emergency room about 2 weeks ago with similar symptoms but had eloped from the ER without further testing. She saw her primary care physician and was referred to see pulmonologist tomorrow for the same complaints. She says she has quit smoking at this time, denies any travel or exposure to allergens. Symptoms started with cough, which has been mostly dry. She feels she has stuffy throat but unable to cough any phlegm. Has low-grade fevers at home, no chills. Complains of nausea and diarrhea and vomiting since. She started to feel chest tightness for the last week. It has been constant without worsening, no radiation. Also associated with nausea and diaphoresis. She has significant family history of early onset coronary artery disease. Patient denies any personal history of coronary artery disease. Her first troponin is negative. CT of the chest is negative for any acute findings. So she is being admitted under observation.  PAST MEDICAL HISTORY:   Past Medical History:  Diagnosis Date  . Acid reflux   . Anxiety   . Depression    Bipolar  . Heart murmur   . HTN (hypertension) 03/20/2015  . Hypertension   . Thrombus    right lung in 2015, off anticoagulation now    PAST SURGICAL HISTORY:   Past Surgical History:  Procedure  Laterality Date  . ABDOMINAL HYSTERECTOMY    . CESAREAN SECTION Bilateral   . CESAREAN SECTION Bilateral   . ELBOW SURGERY     bilateral  . thumb surgery     right   . TONSILLECTOMY      SOCIAL HISTORY:   Social History  Substance Use Topics  . Smoking status: Former Games developer  . Smokeless tobacco: Never Used  . Alcohol use No    FAMILY HISTORY:   Family History  Problem Relation Age of Onset  . CAD Mother   . CAD Sister   . Throat cancer Brother   . CAD Maternal Grandmother   . CAD Maternal Grandfather     DRUG ALLERGIES:   Allergies  Allergen Reactions  . Honey Bee Venom Anaphylaxis  . Sulfacetamide Sodium Hives and Swelling  . Levofloxacin Nausea Only and Rash  . Sulfa Antibiotics Hives and Swelling  . Codeine Rash    REVIEW OF SYSTEMS:   Review of Systems  Constitutional: Positive for fever and malaise/fatigue. Negative for chills and weight loss.  HENT: Negative for ear discharge, ear pain, nosebleeds and tinnitus.   Eyes: Negative for blurred vision, double vision and photophobia.  Respiratory: Positive for cough and shortness of breath. Negative for hemoptysis and wheezing.   Cardiovascular: Positive for chest pain and orthopnea. Negative for palpitations and leg swelling.  Gastrointestinal: Positive for diarrhea, nausea and vomiting. Negative for abdominal pain, constipation, heartburn and melena.  Genitourinary: Negative for dysuria, frequency and  urgency.  Musculoskeletal: Positive for back pain and myalgias. Negative for neck pain.  Skin: Negative for rash.  Neurological: Negative for dizziness, tremors, sensory change, speech change, focal weakness and headaches.  Endo/Heme/Allergies: Does not bruise/bleed easily.  Psychiatric/Behavioral: Negative for depression.    MEDICATIONS AT HOME:   Prior to Admission medications   Medication Sig Start Date End Date Taking? Authorizing Provider  albuterol (PROVENTIL HFA;VENTOLIN HFA) 108 (90 Base) MCG/ACT  inhaler Inhale 1 puff into the lungs every 4 (four) hours. 11/18/15 11/17/16 Yes Historical Provider, MD  amLODipine (NORVASC) 5 MG tablet Take 5 mg by mouth daily. 01/12/16  Yes Historical Provider, MD  budesonide-formoterol (SYMBICORT) 160-4.5 MCG/ACT inhaler Inhale 2 puffs into the lungs 2 (two) times daily. 11/18/15 11/17/16 Yes Historical Provider, MD  clonazePAM (KLONOPIN) 1 MG tablet Take 1 tablet (1 mg total) by mouth 3 (three) times daily. 03/29/16  Yes Jimmy Footman, MD  COMBIVENT RESPIMAT 20-100 MCG/ACT AERS respimat Inhale 1 puff into the lungs 4 (four) times daily as needed. For wheezing. 01/12/16  Yes Historical Provider, MD  esomeprazole (NEXIUM) 40 MG capsule Take 40 mg by mouth daily.    Yes Historical Provider, MD  FLUoxetine (PROZAC) 20 MG capsule Take 1 capsule (20 mg total) by mouth daily. 03/29/16  Yes Jimmy Footman, MD  fluticasone (FLONASE) 50 MCG/ACT nasal spray Place 2 sprays into both nostrils daily as needed for rhinitis.    Yes Historical Provider, MD  furosemide (LASIX) 20 MG tablet Take 20 mg by mouth daily. 01/12/16  Yes Historical Provider, MD  gabapentin (NEURONTIN) 300 MG capsule Take 300 mg by mouth 3 (three) times daily. 03/18/16  Yes Historical Provider, MD  levothyroxine (SYNTHROID, LEVOTHROID) 25 MCG tablet Take 25 mcg by mouth daily before breakfast. 01/24/16  Yes Historical Provider, MD  lisinopril (PRINIVIL,ZESTRIL) 10 MG tablet Take 10 mg by mouth daily.   Yes Historical Provider, MD  Vitamin D, Ergocalciferol, (DRISDOL) 50000 units CAPS capsule Take 50,000 Units by mouth once a week. 03/16/16  Yes Historical Provider, MD      VITAL SIGNS:  Blood pressure 130/69, pulse 80, temperature 98 F (36.7 C), temperature source Oral, resp. rate 17, height 5\' 2"  (1.575 m), weight 81.6 kg (180 lb), SpO2 95 %.  PHYSICAL EXAMINATION:   Physical Exam  GENERAL:  55 y.o.-year-old patient lying in the bed with no acute distress. Appears dyspneic while  talking. EYES: Pupils equal, round, reactive to light and accommodation. No scleral icterus. Extraocular muscles intact.  HEENT: Head atraumatic, normocephalic. Oropharynx and nasopharynx clear.  NECK:  Supple, no jugular venous distention. No thyroid enlargement, no tenderness.  LUNGS: Normal breath sounds bilaterally, no rales,rhonchi or crepitation. No use of accessory muscles of respiration. Scattered wheezing CARDIOVASCULAR: S1, S2 normal. No murmurs, rubs, or gallops.  ABDOMEN: Soft, nontender, nondistended. Bowel sounds present. No organomegaly or mass.  EXTREMITIES: No pedal edema, cyanosis, or clubbing.  NEUROLOGIC: Cranial nerves II through XII are intact. Muscle strength 5/5 in all extremities. Sensation intact. Gait not checked.  PSYCHIATRIC: The patient is alert and oriented x 3.  SKIN: No obvious rash, lesion, or ulcer.   LABORATORY PANEL:   CBC  Recent Labs Lab 06/06/16 1347  WBC 7.5  HGB 14.4  HCT 42.2  PLT 328   ------------------------------------------------------------------------------------------------------------------  Chemistries   Recent Labs Lab 06/06/16 1347  NA 135  K 3.9  CL 105  CO2 19*  GLUCOSE 89  BUN 11  CREATININE 0.94  CALCIUM 9.6   ------------------------------------------------------------------------------------------------------------------  Cardiac Enzymes  Recent Labs Lab 06/06/16 1347  TROPONINI <0.03   ------------------------------------------------------------------------------------------------------------------  RADIOLOGY:  Dg Chest 2 View  Result Date: 06/06/2016 CLINICAL DATA:  Shortness of breath for couple of weeks. EXAM: CHEST  2 VIEW COMPARISON:  09/20/2015 FINDINGS: The cardiac silhouette is upper limits of normal in size. The lungs are clear. No pleural effusion or pneumothorax is identified. No acute osseous abnormality is seen. IMPRESSION: No active cardiopulmonary disease. Electronically Signed   By:  Sebastian Ache M.D.   On: 06/06/2016 16:00  Ct Angio Chest Pe W/cm &/or Wo Cm  Result Date: 06/06/2016 CLINICAL DATA:  Increasing shortness of breath and cough for 3 weeks. History of PE. EXAM: CT ANGIOGRAPHY CHEST WITH CONTRAST TECHNIQUE: Multidetector CT imaging of the chest was performed using the standard protocol during bolus administration of intravenous contrast. Multiplanar CT image reconstructions and MIPs were obtained to evaluate the vascular anatomy. CONTRAST:  75 cc Isovue 370 intravenously. COMPARISON:  Chest radiograph 06/06/2016, chest CT 05/28/2015 FINDINGS: Mediastinum/Lymph Nodes: No pulmonary emboli or thoracic aortic dissection identified. No masses or pathologically enlarged lymph nodes identified. The heart is normal in size. There is no pericardial effusion. Lungs/Pleura: No pulmonary mass, infiltrate, or effusion. Mild bibasilar hypoventilatory changes dependently. Upper abdomen: No acute findings. Musculoskeletal: No chest wall mass or suspicious bone lesions identified. Review of the MIP images confirms the above findings. IMPRESSION: No evidence of pulmonary embolus or other vascular abnormality. No evidence of pulmonary infiltrate. Electronically Signed   By: Ted Mcalpine M.D.   On: 06/06/2016 19:19   EKG:   Orders placed or performed during the hospital encounter of 05/22/16  . EKG 12-Lead  . EKG 12-Lead  . EKG 12-Lead  . EKG 12-Lead  . EKG    IMPRESSION AND PLAN:   Rebecca Ramos  is a 55 y.o. female with a known history of COPD not on home oxygen, hypertension, history of PE currently off of anticoagulation, anxiety and depression presents to the hospital secondary to 2 week history of shortness of breath and cough.  #1 chest pain-could be from COPD exacerbation. However due to strong cardiac family history, rule out underlying cardiac causes. -Admit to telemetry, recycle troponins. -Started on aspirin. Myoview tomorrow morning. -Check lipid panel. -Check  urine drug screen  #2 mild COPD exacerbation with bronchitis-started on low-dose Solu-Medrol, duo nebs and inhalers. -On azithromycin and cough medications added. -Oxygen support as needed.  #3 anxiety and depression-continue outpatient medications. Last admission to behavioral medicine unit was in May 2017  #4 hypertension-on lisinopril and Norvasc  #5 DVT prophylaxis-on Lovenox  Ambulated to check O2 sats and heart rate prior to discharge.    All the records are reviewed and case discussed with ED provider. Management plans discussed with the patient, family and they are in agreement.  CODE STATUS: Full Code  TOTAL TIME TAKING CARE OF THIS PATIENT: 50 minutes.    Enid Baas M.D on 06/06/2016 at 7:53 PM  Between 7am to 6pm - Pager - [phone]  After 6pm go to [email or web address] - password [email or web address] [facility]  [facility]  Office  [phone]  CC: Primary care physician; Amy Diana Eves, NP

## 2016-06-06 NOTE — ED Notes (Signed)
Patient is resting much more comfortably. Patient denies any needs at this time.

## 2016-06-06 NOTE — ED Notes (Signed)
Pt transported to 250  

## 2016-06-06 NOTE — Progress Notes (Signed)
Pt requesting home meds that were not prescribed here, ambien & percocet. MD paged, MD to put in orders for the two home meds requested. No further complaints. Will continue to monitor. Shirley Friar, RN

## 2016-06-06 NOTE — ED Notes (Signed)
Patient placed on 2L [place] for comfort. MD notified of patients condition.

## 2016-06-06 NOTE — ED Provider Notes (Signed)
[facility] Emergency Department Provider Note  ____________________________________________  Time seen: Approximately 3:32 PM  I have reviewed the triage vital signs and the nursing notes.   HISTORY  Chief Complaint Shortness of Breath    HPI Rebecca Ramos is a 55 y.o. female with a hx of COPD, PE after self discontinuation of Coumadin,HTN, presenting with shortness of breath. The patient reports that for the past several weeks she has had progressively worsening shortness of breath. It is worse with exertion but not with laying flat. She has associated cough that is nonproductive, no congestion, rhinorrhea, sore throat or ear pain. The patient also describes a constant chest tightness feeling that is worse when she takes deep breaths. She denies any lower extremity swelling or calf pain, fever or chills. No known sick contacts. The patient has tried multiple inhalers and nebulizer treatments at home have not helped. She was seen here for similar symptoms on 05/22/16 and eloped prior to her CT angiogram.   Past Medical History:  Diagnosis Date  . Acid reflux   . Heart murmur   . HTN (hypertension) 03/20/2015  . Hypertension   . Thrombus    right lung    Patient Active Problem List   Diagnosis Date Noted  . Cocaine use disorder, moderate, dependence (HCC) 03/28/2016  . Severe recurrent major depression without psychotic features (HCC) 03/26/2016  . Suicide attempt (HCC) 03/26/2016  . Chronic back pain 03/26/2016  . HTN (hypertension) 03/20/2015    Past Surgical History:  Procedure Laterality Date  . ABDOMINAL HYSTERECTOMY    . CESAREAN SECTION Bilateral   . CESAREAN SECTION Bilateral   . ELBOW SURGERY     bilateral  . thumb surgery     right   . TONSILLECTOMY      Current Outpatient Rx  . Order #: [id number] Class: Historical Med  . Order #: [id number] Class: Historical Med  . Order #: [id number] Class: Print  . Order #: [id number] Class: Historical  Med  . Order #: [id number] Class: Historical Med  . Order #: [id number] Class: Print  . Order #: [id number] Class: Historical Med  . Order #: [id number] Class: Historical Med  . Order #: [id number] Class: Historical Med  . Order #: [id number] Class: Historical Med  . Order #: [id number] Class: Historical Med  . Order #: [id number] Class: Historical Med    Allergies Honey bee venom; Sulfacetamide sodium; Levofloxacin; Sulfa antibiotics; and Codeine  History reviewed. No pertinent family history.  Social History Social History  Substance Use Topics  . Smoking status: Former Games developer  . Smokeless tobacco: Never Used  . Alcohol use No    Review of Systems Constitutional: No fever/chills.No lightheadedness or syncope. Eyes: No visual changes. No eye discharge ENT: No sore throat. No congestion or rhinorrhea. No ear pain Cardiovascular: Positive chest pain. Denies palpitations. Respiratory: Positive shortness of breath. No orthopnea. Positive nonproductive cough. Gastrointestinal: No abdominal pain.  No nausea, no vomiting.  No diarrhea.  No constipation. Genitourinary: Negative for dysuria. Musculoskeletal: Negative for back pain. No lower extremity swelling. No calf pain. Skin: Negative for rash. Neurological: Negative for headaches. No focal numbness, tingling or weakness.   10-point ROS otherwise negative.  ____________________________________________   PHYSICAL EXAM:  VITAL SIGNS: ED Triage Vitals  Enc Vitals Group     BP 06/06/16 1400 127/90     Pulse Rate 06/06/16 1400 71     Resp 06/06/16 1430 (!) 33     Temp 06/06/16 1400 98 F (36.7 C)     Temp Source 06/06/16 1400  Oral     SpO2 06/06/16 1400 98 %     Weight 06/06/16 1400 180 lb (81.6 kg)     Height 06/06/16 1400  (1.575 m)     Head Circumference --      Peak Flow --      Pain Score 06/06/16 1401 7     Pain Loc --      Pain Edu? --      Excl. in GC? --     Constitutional: Patient is alert and oriented and  mentating properly. She is diaphoretic, and uncomfortable appearing but nontoxic. Eyes: Conjunctivae are normal.  EOMI. No scleral icterus. No eye discharge. Head: Atraumatic. Nose: No congestion/rhinnorhea. Mouth/Throat: Mucous membranes are moist.  Neck: No stridor.  Supple.  No JVD. Cardiovascular: Normal rate, regular rhythm. No murmurs, rubs or gallops.  Respiratory: Tachypnea with mild accessory muscle use but no retractions. Mild end expiratory wheezing, this is after a DuoNeb in the ED. No rales or rhonchi. Gastrointestinal: Soft, nontender and nondistended.  No guarding or rebound.  No peritoneal signs. Musculoskeletal: No LE edema. No ttp in the calves or palpable cords.  Negative Homan's sign. Neurologic:  A&Ox3.  Speech is clear.  Face and smile are symmetric.  EOMI.  Moves all extremities well. Skin:  Skin is diaphoretic. No rash noted. Psychiatric: Mood and affect are normal. Speech and behavior are normal.  Normal judgement.  ____________________________________________   LABS (all labs ordered are listed, but only abnormal results are displayed)  Labs Reviewed  CULTURE, BLOOD (ROUTINE X 2)  CULTURE, BLOOD (ROUTINE X 2)  CBC  BASIC METABOLIC PANEL  BLOOD GAS, VENOUS  LACTIC ACID, PLASMA  LACTIC ACID, PLASMA  TROPONIN I  BRAIN NATRIURETIC PEPTIDE   ____________________________________________  EKG  ED ECG REPORT I, Rockne Menghini, the attending physician, personally viewed and interpreted this ECG.   Date: 06/06/2016  EKG Time: 1410  Rate: 66  Rhythm: normal sinus rhythm  Axis: leftward  Intervals:none  ST&T Change: Nonspecific T-wave inversion in V1. No ST elevation.  ____________________________________________  RADIOLOGY  No results found.  ____________________________________________   PROCEDURES  Procedure(s) performed: None  Procedures  Critical Care performed: No ____________________________________________   INITIAL  IMPRESSION / ASSESSMENT AND PLAN / ED COURSE  Pertinent labs & imaging results that were available during my care of the patient were reviewed by me and considered in my medical decision making (see chart for details).  55 y.o. female with a history of COPD, PE off of anticoagulation presenting with several weeks of progressively worsening shortness of breath, cough, and inspirational chest pressure. Today, the patient is diaphoretic with mild wheezing on exam, but is able to maintain an oxygen saturation of greater than 95%. I had the nurse ambulate her, and she was able to maintain her oxygen saturation but her heart rate went from the 80s up into the 130s and she became significantly symptomatic. The differential diagnosis for this patient includes pulmonary etiologies including PE, COPD exacerbation, or acute infection such as pneumonia. I would also consider cardiac causes such as ACS or MI. I do not see any evidence of arrhythmia on her EKG.  Due to imaging limitations at [facility] today. The patient will require transfer for CT evaluation to rule out PE.  ____________________________________________  FINAL CLINICAL IMPRESSION(S) / ED DIAGNOSES  Final diagnoses:  None    Clinical Course  Comment By Time  At this time, the patient's shortness of breath has improved, but she continues to  have chest pain. The CT scanner [facility] is now functional, the patient will not require transfer for CT angiogram to evaluate for PE. I called the CT technicians to let them know that the patient will need the study. After the results, we will proceed with admission for the patient's symptoms. Rockne Menghini, MD 07/24 1745   ----------------------------------------- 7:04 PM on 06/06/2016 -----------------------------------------  The patient CT angiogram is negative for PE. We'll plan admission to the hospital.   NEW MEDICATIONS STARTED DURING THIS VISIT:  New Prescriptions    No medications on file      Rockne Menghini, MD 06/06/16 1904

## 2016-06-07 ENCOUNTER — Observation Stay (HOSPITAL_BASED_OUTPATIENT_CLINIC_OR_DEPARTMENT_OTHER)
Admit: 2016-06-07 | Discharge: 2016-06-07 | Disposition: A | Discharge status: Home / Self Care | Payer: Medicare Other | Attending: Internal Medicine | Admitting: Internal Medicine

## 2016-06-07 ENCOUNTER — Observation Stay (HOSPITAL_BASED_OUTPATIENT_CLINIC_OR_DEPARTMENT_OTHER): Payer: Medicare Other

## 2016-06-07 ENCOUNTER — Ambulatory Visit: Payer: Self-pay | Admitting: Internal Medicine

## 2016-06-07 DIAGNOSIS — J209 Acute bronchitis, unspecified: Secondary | ICD-10-CM | POA: Diagnosis present

## 2016-06-07 DIAGNOSIS — R0789 Other chest pain: Secondary | ICD-10-CM

## 2016-06-07 DIAGNOSIS — R079 Chest pain, unspecified: Secondary | ICD-10-CM

## 2016-06-07 DIAGNOSIS — E785 Hyperlipidemia, unspecified: Secondary | ICD-10-CM | POA: Diagnosis present

## 2016-06-07 DIAGNOSIS — E669 Obesity, unspecified: Secondary | ICD-10-CM | POA: Diagnosis present

## 2016-06-07 DIAGNOSIS — J449 Chronic obstructive pulmonary disease, unspecified: Secondary | ICD-10-CM | POA: Diagnosis present

## 2016-06-07 DIAGNOSIS — F1410 Cocaine abuse, uncomplicated: Secondary | ICD-10-CM | POA: Diagnosis present

## 2016-06-07 DIAGNOSIS — Z716 Tobacco abuse counseling: Secondary | ICD-10-CM

## 2016-06-07 DIAGNOSIS — J441 Chronic obstructive pulmonary disease with (acute) exacerbation: Secondary | ICD-10-CM | POA: Diagnosis not present

## 2016-06-07 LAB — CBC
HEMATOCRIT: 39 % (ref 35.0–47.0)
HEMOGLOBIN: 13.7 g/dL (ref 12.0–16.0)
MCH: 31 pg (ref 26.0–34.0)
MCHC: 35.1 g/dL (ref 32.0–36.0)
MCV: 88.5 fL (ref 80.0–100.0)
Platelets: 279 10*3/uL (ref 150–440)
RBC: 4.4 MIL/uL (ref 3.80–5.20)
RDW: 13.3 % (ref 11.5–14.5)
WBC: 8.3 10*3/uL (ref 3.6–11.0)

## 2016-06-07 LAB — ECHOCARDIOGRAM COMPLETE
Height: 62 in
WEIGHTICAEL: 3552 [oz_av]

## 2016-06-07 LAB — BASIC METABOLIC PANEL
ANION GAP: 9 (ref 5–15)
BUN: 11 mg/dL (ref 6–20)
CALCIUM: 8.9 mg/dL (ref 8.9–10.3)
CHLORIDE: 110 mmol/L (ref 101–111)
CO2: 20 mmol/L — AB (ref 22–32)
Creatinine, Ser: 0.82 mg/dL (ref 0.44–1.00)
GFR calc Af Amer: >60 mL/min (ref >60)
GFR calc non Af Amer: >60 mL/min (ref >60)
GLUCOSE: 137 mg/dL — AB (ref 65–99)
Potassium: 4.2 mmol/L (ref 3.5–5.1)
Sodium: 139 mmol/L (ref 135–145)

## 2016-06-07 LAB — HEMOGLOBIN A1C: Hgb A1c MFr Bld: 4.8 % (ref 4.0–6.0)

## 2016-06-07 LAB — TROPONIN I: Troponin I: <0.03 ng/mL (ref <0.03)

## 2016-06-07 MED ORDER — TECHNETIUM TC 99M TETROFOSMIN IV KIT
13.0000 | PACK | Freq: Once | INTRAVENOUS | Status: AC | PRN
  Administered 2016-06-07: 12.9 via INTRAVENOUS

## 2016-06-07 MED ORDER — NICOTINE POLACRILEX 2 MG MT GUM
2.0000 mg | CHEWING_GUM | OROMUCOSAL | Status: DC | PRN
  Filled 2016-06-07: qty 1

## 2016-06-07 MED ORDER — REGADENOSON 0.4 MG/5ML IV SOLN
0.4000 mg | Freq: Once | INTRAVENOUS | Status: AC
Start: 2016-06-07 — End: 2016-06-07
  Administered 2016-06-07: 0.4 mg via INTRAVENOUS

## 2016-06-07 MED ORDER — TECHNETIUM TC 99M TETROFOSMIN IV KIT
28.8000 | PACK | Freq: Once | INTRAVENOUS | Status: AC | PRN
  Administered 2016-06-07: 28.8 via INTRAVENOUS

## 2016-06-07 MED ORDER — IPRATROPIUM-ALBUTEROL 0.5-2.5 (3) MG/3ML IN SOLN
3.0000 mL | Freq: Four times a day (QID) | RESPIRATORY_TRACT | Status: DC
  Administered 2016-06-08 (×2): 3 mL via RESPIRATORY_TRACT
  Filled 2016-06-07 (×2): qty 3

## 2016-06-07 NOTE — Progress Notes (Signed)
[facility] [place] at [facility]   PATIENT NAME: Rebecca Ramos    MR#:  [id number]  DATE OF BIRTH:  03/22/1961  SUBJECTIVE:  CHIEF COMPLAINT:   Chief Complaint  Patient presents with  . Shortness of Breath    Review of Systems  Constitutional: Negative for chills, fever and weight loss.  HENT: Negative for congestion.   Eyes: Negative for blurred vision and double vision.  Respiratory: Positive for cough, shortness of breath and wheezing. Negative for sputum production.   Cardiovascular: Positive for chest pain. Negative for palpitations, orthopnea, leg swelling and PND.  Gastrointestinal: Negative for abdominal pain, blood in stool, constipation, diarrhea, nausea and vomiting.  Genitourinary: Negative for dysuria, frequency, hematuria and urgency.  Musculoskeletal: Negative for falls.  Neurological: Negative for dizziness, tremors, focal weakness and headaches.  Endo/Heme/Allergies: Does not bruise/bleed easily.  Psychiatric/Behavioral: Negative for depression. The patient does not have insomnia.     VITAL SIGNS: Blood pressure 123/72, pulse 99, temperature 97.5 F (36.4 C), temperature source Oral, resp. rate 18, height 5\' 2"  (1.575 m), weight 100.7 kg (222 lb), SpO2 95 %.  PHYSICAL EXAMINATION:   GENERAL:  55 y.o.-year-old patient lying in the bed in moderate respiratory distress, uncomfortable, tachypneic, stressed out.  EYES: Pupils equal, round, reactive to light and accommodation. No scleral icterus. Extraocular muscles intact.  HEENT: Head atraumatic, normocephalic. Oropharynx and nasopharynx clear.  NECK:  Supple, no jugular venous distention. No thyroid enlargement, no tenderness.  LUNGS: Some diminished breath sounds bilaterally, scattered  rales,rhonchi , but no wheezing or crepitations laterally. Intermittent use of accessory muscles of respiration, especially with speech.  CARDIOVASCULAR: S1, S2 , tachycardic, rhythm is regular, . No  murmurs, rubs, or gallops.  ABDOMEN: Soft, nontender, nondistended. Bowel sounds present. No organomegaly or mass.  EXTREMITIES: No pedal edema, cyanosis, or clubbing.  NEUROLOGIC: Cranial nerves II through XII are intact. Muscle strength 5/5 in all extremities. Sensation intact. Gait not checked.  PSYCHIATRIC: The patient is alert and oriented x 3, anxious  SKIN: No obvious rash, lesion, or ulcer.   ORDERS/RESULTS REVIEWED:   CBC  Recent Labs Lab 06/06/16 1347 06/07/16 0255  WBC 7.5 8.3  HGB 14.4 13.7  HCT 42.2 39.0  PLT 328 279  MCV 87.3 88.5  MCH 29.8 31.0  MCHC 34.1 35.1  RDW 13.5 13.3   ------------------------------------------------------------------------------------------------------------------  Chemistries   Recent Labs Lab 06/06/16 1347 06/07/16 0255  NA 135 139  K 3.9 4.2  CL 105 110  CO2 19* 20*  GLUCOSE 89 137*  BUN 11 11  CREATININE 0.94 0.82  CALCIUM 9.6 8.9   ------------------------------------------------------------------------------------------------------------------ estimated creatinine clearance is 89.1 mL/min (by C-G formula based on SCr of 0.82 mg/dL). ------------------------------------------------------------------------------------------------------------------  Recent Labs  06/06/16 2150  TSH 1.202    Cardiac Enzymes  Recent Labs Lab 06/06/16 2150 06/07/16 0255 06/07/16 0858  TROPONINI <0.03 <0.03 <0.03   ------------------------------------------------------------------------------------------------------------------ Invalid input(s): POCBNP ---------------------------------------------------------------------------------------------------------------  RADIOLOGY: Dg Chest 2 View  Result Date: 06/06/2016 CLINICAL DATA:  Shortness of breath for couple of weeks. EXAM: CHEST  2 VIEW COMPARISON:  09/20/2015 FINDINGS: The cardiac silhouette is upper limits of normal in size. The lungs are clear. No pleural effusion or  pneumothorax is identified. No acute osseous abnormality is seen. IMPRESSION: No active cardiopulmonary disease. Electronically Signed   By: Sebastian Ache M.D.   On: 06/06/2016 16:00  Ct Angio Chest Pe W/cm &/or Wo Cm  Result Date: 06/06/2016 CLINICAL DATA:  Increasing shortness of breath  and cough for 3 weeks. History of PE. EXAM: CT ANGIOGRAPHY CHEST WITH CONTRAST TECHNIQUE: Multidetector CT imaging of the chest was performed using the standard protocol during bolus administration of intravenous contrast. Multiplanar CT image reconstructions and MIPs were obtained to evaluate the vascular anatomy. CONTRAST:  75 cc Isovue 370 intravenously. COMPARISON:  Chest radiograph 06/06/2016, chest CT 05/28/2015 FINDINGS: Mediastinum/Lymph Nodes: No pulmonary emboli or thoracic aortic dissection identified. No masses or pathologically enlarged lymph nodes identified. The heart is normal in size. There is no pericardial effusion. Lungs/Pleura: No pulmonary mass, infiltrate, or effusion. Mild bibasilar hypoventilatory changes dependently. Upper abdomen: No acute findings. Musculoskeletal: No chest wall mass or suspicious bone lesions identified. Review of the MIP images confirms the above findings. IMPRESSION: No evidence of pulmonary embolus or other vascular abnormality. No evidence of pulmonary infiltrate. Electronically Signed   By: Ted Mcalpine M.D.   On: 06/06/2016 19:19   EKG:  Orders placed or performed during the hospital encounter of 05/22/16  . EKG 12-Lead  . EKG 12-Lead  . EKG 12-Lead  . EKG 12-Lead  . EKG    ASSESSMENT AND PLAN:  Active Problems:   Chest pain #1. Chest pain, negative cardiac enzymes, suspicious for musculoskeletal chest discomfort due to increased work of breathing, Myoview stress test is pending, continue aspirin #2. COPD exacerbation, continue steroids, inhalation therapy, Zithromax, get sputum cultures if possible. CT angiogram of the chest was negative for pulmonary  embolism, no pneumonia  #3 hyperlipidemia, initiate patient on low-fat, low-cholesterol diet, she may benefit from losing weight #4. Obesity, TSH was normal, lipid panel was observed, LDL was found to be high at 142, triglycerides 231, getting hemoglobin A1c #5. Drug abuse, cocaine, TCA, benzodiazepines on urine drug screen, patient was counseled. She states that she never used cocaine #6. Tobacco abuse. Counseling, discussed this patient for 4 minutes. Nicotine replacement therapy is going to be initiated, agreeable  Management plans discussed with the patient, family and they are in agreement.   DRUG ALLERGIES:  Allergies  Allergen Reactions  . Honey Bee Venom Anaphylaxis  . Sulfacetamide Sodium Hives and Swelling  . Levofloxacin Nausea Only and Rash  . Sulfa Antibiotics Hives and Swelling  . Codeine Rash    CODE STATUS:     Code Status Orders        Start     Ordered   06/06/16 2016  Full code  Continuous     06/06/16 2015    Code Status History    Date Active Date Inactive Code Status Order ID Comments User Context   06/06/2016  8:15 PM 06/07/2016  8:26 AM Full Code [id number]  Enid Baas, MD ED   03/26/2016 11:27 PM 03/29/2016  5:29 PM Full Code [id number]  Audery Amel, MD Inpatient   03/20/2015  8:04 PM 03/21/2015 12:14 AM Full Code [id number]  Altamese Dilling, MD ED      TOTAL TIME TAKING CARE OF THIS PATIENT: 40  minutes.    Katharina Caper M.D on 06/07/2016 at 11:25 AM  Between 7am to 6pm - Pager - [phone]  After 6pm go to [email or web address] - password [email or web address] [facility]  [facility]  Office  [phone]  CC: Primary care physician; Amy Diana Eves, NP

## 2016-06-07 NOTE — Progress Notes (Signed)
*  PRELIMINARY RESULTS* Echocardiogram 2D Echocardiogram has been performed.  Cristela Blue 06/07/2016, 8:54 AM

## 2016-06-07 NOTE — Care Management (Signed)
Observation patient admitted with shortness of breath. Hx: COPD not on home O2. Patient lives at home with her spouse. She is independent, active and drives. She denies issues obtaining medications, copays or medical care. PCP is Dr. Laroy Apple. Last seen in the past week. It is noted that patient was positive for cocaine on admission. No needs identified at this time.

## 2016-06-07 NOTE — Progress Notes (Signed)
Arrival Method: via stretcher with ED tech Mental Orientation: A&O Telemetry: [id number] Skin: Intact, verified by Deatra James, RN IV:  20g Left AC & right AC Pain: chest pressure at a 7 out of 10. Tubes: O2 @ 2L Safety Measures: Safety Fall Prevention Plan has been given, discussed & signed, non skid socks in place. Bed alarm not needed. 2A Orientation: Patient has been orientated to the room, unit & staff.  Family: Has been informed of plan of care.  Orders have been reviewed & implemented. Will continue to monitor the patient. Call light has been placed within reach.  Eden Lathe, RN

## 2016-06-07 NOTE — Care Management Obs Status (Signed)
MEDICARE OBSERVATION STATUS NOTIFICATION   Patient Details  Name: Rebecca Ramos MRN: [id number] Date of Birth: 07-18-1961   Medicare Observation Status Notification Given:  Yes    Marily Memos, RN 06/07/2016, 9:06 AM

## 2016-06-08 ENCOUNTER — Ambulatory Visit: Payer: Self-pay | Admitting: Family Medicine

## 2016-06-08 DIAGNOSIS — J209 Acute bronchitis, unspecified: Secondary | ICD-10-CM

## 2016-06-08 DIAGNOSIS — Z716 Tobacco abuse counseling: Secondary | ICD-10-CM

## 2016-06-08 DIAGNOSIS — J449 Chronic obstructive pulmonary disease, unspecified: Secondary | ICD-10-CM

## 2016-06-08 DIAGNOSIS — E785 Hyperlipidemia, unspecified: Secondary | ICD-10-CM

## 2016-06-08 DIAGNOSIS — E669 Obesity, unspecified: Secondary | ICD-10-CM

## 2016-06-08 DIAGNOSIS — R0789 Other chest pain: Secondary | ICD-10-CM | POA: Diagnosis not present

## 2016-06-08 DIAGNOSIS — F1410 Cocaine abuse, uncomplicated: Secondary | ICD-10-CM

## 2016-06-08 LAB — NM MYOCAR MULTI W/SPECT W/WALL MOTION / EF
CHL CUP NUCLEAR SDS: 0
CHL CUP RESTING HR STRESS: 82 {beats}/min
CHL CUP STRESS STAGE 1 GRADE: 0 %
CHL CUP STRESS STAGE 1 SPEED: 0 mph
CHL CUP STRESS STAGE 2 GRADE: 0 %
CHL CUP STRESS STAGE 2 SPEED: 0 mph
CHL CUP STRESS STAGE 3 HR: 100 {beats}/min
CHL CUP STRESS STAGE 4 GRADE: 0 %
CHL CUP STRESS STAGE 4 SPEED: 0 mph
CHL CUP STRESS STAGE 5 GRADE: 0 %
CHL CUP STRESS STAGE 5 HR: 97 {beats}/min
CHL CUP STRESS STAGE 5 SBP: 120 mmHg
CSEPEW: 1 METS
CSEPHR: 59 %
CSEPPHR: 100 {beats}/min
CSEPPMHR: 59 %
LV dias vol: 81 mL (ref 46–106)
LV sys vol: 33 mL
NUC STRESS TID: 0.89
SRS: 3
SSS: 1
Stage 1 HR: 82 {beats}/min
Stage 2 HR: 82 {beats}/min
Stage 3 Grade: 0 %
Stage 3 Speed: 0 mph
Stage 4 HR: 97 {beats}/min
Stage 5 DBP: 66 mmHg
Stage 5 Speed: 0 mph

## 2016-06-08 MED ORDER — AZITHROMYCIN 500 MG PO TABS: 500.0000 mg | ORAL_TABLET | Freq: Every day | ORAL | 0 refills | Status: DC

## 2016-06-08 MED ORDER — HYDROCOD POLST-CPM POLST ER 10-8 MG/5ML PO SUER: 10.0000 mL | Freq: Two times a day (BID) | ORAL | 0 refills | Status: DC

## 2016-06-08 MED ORDER — NICOTINE POLACRILEX 2 MG MT GUM: 2.0000 mg | CHEWING_GUM | OROMUCOSAL | 0 refills | Status: DC | PRN

## 2016-06-08 MED ORDER — IPRATROPIUM-ALBUTEROL 0.5-2.5 (3) MG/3ML IN SOLN: 3.0000 mL | RESPIRATORY_TRACT | 0 refills | Status: DC | PRN

## 2016-06-08 MED ORDER — PREDNISONE 10 MG (21) PO TBPK: 10.0000 mg | ORAL_TABLET | Freq: Every day | ORAL | 0 refills | Status: DC

## 2016-06-08 MED ORDER — GUAIFENESIN ER 600 MG PO TB12: 600.0000 mg | ORAL_TABLET | Freq: Two times a day (BID) | ORAL | Status: AC

## 2016-06-08 NOTE — Discharge Summary (Signed)
[facility] [place] at [facility]   PATIENT NAME: Rebecca Ramos    MR#:  [id number]  DATE OF BIRTH:  23-Oct-1961  DATE OF ADMISSION:  06/07/2016 ADMITTING PHYSICIAN: No admitting provider for patient encounter.  DATE OF DISCHARGE: 06/07/2016 11:59 PM  PRIMARY CARE PHYSICIAN: Amy L Krebs, NP     ADMISSION DIAGNOSIS:  No admission diagnoses are documented for this encounter.  DISCHARGE DIAGNOSIS:  Active Problems:   Chest pain   COPD exacerbation (HCC)   Acute bronchitis   Hyperlipidemia   Obesity   Cocaine abuse   Tobacco abuse counseling   SECONDARY DIAGNOSIS:   Past Medical History:  Diagnosis Date  . Acid reflux   . Anxiety   . Depression    Bipolar  . Heart murmur   . HTN (hypertension) 03/20/2015  . Hypertension   . Thrombus    right lung in 2015, off anticoagulation now    .pro HOSPITAL COURSE:  Patient is a 55 year old Caucasian female with past medical history significant for history of tobacco abuse, COPD, essential hypertension, history of pulmonary embolism, anxiety and depression, who presents to the hospital with complaints of 2 week history of shortness of breath and cough. On arrival to the hospital, she complained of low-grade fevers, nausea, diarrhea, vomiting, chest tightness. Patient's labs revealed normal BMP, although patient's bicarbonate level was noted to be low at 19, normal cardiac enzymes, lipid panel showed a cholesterol level of 231, triglycerides of 231, LDL of 142, lactic acid level was elevated at 2.9. Urine drug screen was positive for benzodiazepines, cocaine, tricyclic antidepressants. Chest x-ray was within normal limits. Patient was admitted to the hospital for further evaluation and treatment with diagnosis of COPD exacerbation, acute bronchitis. She was initiated on steroids, antibiotics, inhalation therapy, and her condition improved. She was advised to continue antibiotic therapy, steroid taper, inhalation  therapy and stop smoking, she was counseled against smoking. She was also counseled again drug abuse. Epigastric chest tightness, she underwent stress Myoview test, which was negative for inducible ischemia, left ventricular ejection fraction was found to be normal. Echocardiogram was also done during this admission, essentially normal, except of grade 1 diastolic dysfunction. Discussion by problem: #1. Chest pain, negative cardiac enzymes, suspicious for musculoskeletal chest discomfort due to increased work of breathing, Myoview stress test is normal, as well as echocardiogram, appreciate cardiologist input. #2. COPD exacerbation, continue tapering steroids, inhalation therapy, Zithromax, unable to get sputum cultures. CT angiogram of the chest was negative for pulmonary embolism, no pneumonia  #3 hyperlipidemia, initiate patient on low-fat, low-cholesterol diet, she may benefit from losing weight, discussed this patient #4. Obesity, TSH was normal, lipid panel was observed, LDL was found to be high at 142, triglycerides 231, hemoglobin A1c 4.8, no diabetes, patient needs to lose weight #5. Drug abuse, cocaine, TCA, benzodiazepines on urine drug screen, patient was counseled. She states that she never used cocaine #6. Tobacco abuse. Counseling, discussed this patient for 4 minutes. Nicotine replacement therapy was initiated, agreeable   DISCHARGE CONDITIONS:   Stable  CONSULTS OBTAINED:    DRUG ALLERGIES:   Allergies  Allergen Reactions  . Honey Bee Venom Anaphylaxis  . Sulfacetamide Sodium Hives and Swelling  . Levofloxacin Nausea Only and Rash  . Sulfa Antibiotics Hives and Swelling  . Codeine Rash    DISCHARGE MEDICATIONS:  Cannot display discharge medications since this is not an admission.    DISCHARGE INSTRUCTIONS:    Patient is to follow-up with primary care  physician  If you experience worsening of your admission symptoms, develop shortness of breath, life threatening  emergency, suicidal or homicidal thoughts you must seek medical attention immediately by calling 911 or calling your MD immediately  if symptoms less severe.  You Must read complete instructions/literature along with all the possible adverse reactions/side effects for all the Medicines you take and that have been prescribed to you. Take any new Medicines after you have completely understood and accept all the possible adverse reactions/side effects.   Please note  You were cared for by a hospitalist during your hospital stay. If you have any questions about your discharge medications or the care you received while you were in the hospital after you are discharged, you can call the unit and asked to speak with the hospitalist on call if the hospitalist that took care of you is not available. Once you are discharged, your primary care physician will handle any further medical issues. Please note that NO REFILLS for any discharge medications will be authorized once you are discharged, as it is imperative that you return to your primary care physician (or establish a relationship with a primary care physician if you do not have one) for your aftercare needs so that they can reassess your need for medications and monitor your lab values.    Today   CHIEF COMPLAINT:  No chief complaint on file.   HISTORY OF PRESENT ILLNESS:  Rebecca Ramos  is a 55 y.o. female with a known history of tobacco abuse, COPD, essential hypertension, history of pulmonary embolism, anxiety and depression, who presents to the hospital with complaints of 2 week history of shortness of breath and cough. On arrival to the hospital, she complained of low-grade fevers, nausea, diarrhea, vomiting, chest tightness. Patient's labs revealed normal BMP, although patient's bicarbonate level was noted to be low at 19, normal cardiac enzymes, lipid panel showed a cholesterol level of 231, triglycerides of 231, LDL of 142, lactic acid level was  elevated at 2.9. Urine drug screen was positive for benzodiazepines, cocaine, tricyclic antidepressants. Chest x-ray was within normal limits. Patient was admitted to the hospital for further evaluation and treatment with diagnosis of COPD exacerbation, acute bronchitis. She was initiated on steroids, antibiotics, inhalation therapy, and her condition improved. She was advised to continue antibiotic therapy, steroid taper, inhalation therapy and stop smoking, she was counseled against smoking. She was also counseled again drug abuse. Epigastric chest tightness, she underwent stress Myoview test, which was negative for inducible ischemia, left ventricular ejection fraction was found to be normal. Echocardiogram was also done during this admission, essentially normal, except of grade 1 diastolic dysfunction. Discussion by problem: #1. Chest pain, negative cardiac enzymes, suspicious for musculoskeletal chest discomfort due to increased work of breathing, Myoview stress test is normal, as well as echocardiogram, appreciate cardiologist input. #2. COPD exacerbation, continue tapering steroids, inhalation therapy, Zithromax, unable to get sputum cultures. CT angiogram of the chest was negative for pulmonary embolism, no pneumonia  #3 hyperlipidemia, initiate patient on low-fat, low-cholesterol diet, she may benefit from losing weight, discussed this patient #4. Obesity, TSH was normal, lipid panel was observed, LDL was found to be high at 142, triglycerides 231, hemoglobin A1c 4.8, no diabetes, patient needs to lose weight #5. Drug abuse, cocaine, TCA, benzodiazepines on urine drug screen, patient was counseled. She states that she never used cocaine #6. Tobacco abuse. Counseling, discussed this patient for 4 minutes. Nicotine replacement therapy was initiated, agreeable     VITAL  SIGNS:  There were no vitals taken for this visit.  I/O:    Intake/Output Summary (Last 24 hours) at 06/08/16 1237 Last  data filed at 06/08/16 0936  Gross per 24 hour  Intake              600 ml  Output             2300 ml  Net            -1700 ml    PHYSICAL EXAMINATION:  GENERAL:  55 y.o.-year-old patient lying in the bed with no acute distress.  EYES: Pupils equal, round, reactive to light and accommodation. No scleral icterus. Extraocular muscles intact.  HEENT: Head atraumatic, normocephalic. Oropharynx and nasopharynx clear.  NECK:  Supple, no jugular venous distention. No thyroid enlargement, no tenderness.  LUNGS: Normal breath sounds bilaterally, no wheezing, rales,rhonchi or crepitation. No use of accessory muscles of respiration.  CARDIOVASCULAR: S1, S2 normal. No murmurs, rubs, or gallops.  ABDOMEN: Soft, non-tender, non-distended. Bowel sounds present. No organomegaly or mass.  EXTREMITIES: No pedal edema, cyanosis, or clubbing.  NEUROLOGIC: Cranial nerves II through XII are intact. Muscle strength 5/5 in all extremities. Sensation intact. Gait not checked.  PSYCHIATRIC: The patient is alert and oriented x 3.  SKIN: No obvious rash, lesion, or ulcer.   DATA REVIEW:   CBC  Recent Labs Lab 06/07/16 0255  WBC 8.3  HGB 13.7  HCT 39.0  PLT 279    Chemistries   Recent Labs Lab 06/07/16 0255  NA 139  K 4.2  CL 110  CO2 20*  GLUCOSE 137*  BUN 11  CREATININE 0.82  CALCIUM 8.9    Cardiac Enzymes  Recent Labs Lab 06/07/16 0858  TROPONINI <0.03    Microbiology Results  Results for orders placed or performed during the hospital encounter of 06/06/16  Blood culture (routine x 2)     Status: None (Preliminary result)   Collection Time: 06/06/16  4:00 PM  Result Value Ref Range Status   Specimen Description BLOOD RIGHT ASSIST CONTROL  Final   Special Requests BOTTLES DRAWN AEROBIC AND ANAEROBIC 10CC  Final   Culture NO GROWTH 2 DAYS  Final   Report Status PENDING  Incomplete  Blood culture (routine x 2)     Status: None (Preliminary result)   Collection Time: 06/06/16   4:00 PM  Result Value Ref Range Status   Specimen Description BLOOD LEFT ARM  Final   Special Requests   Final    BOTTLES DRAWN AEROBIC AND ANAEROBIC 10CC AER 8CC ANA   Culture NO GROWTH 2 DAYS  Final   Report Status PENDING  Incomplete    RADIOLOGY:  Dg Chest 2 View  Result Date: 06/06/2016 CLINICAL DATA:  Shortness of breath for couple of weeks. EXAM: CHEST  2 VIEW COMPARISON:  09/20/2015 FINDINGS: The cardiac silhouette is upper limits of normal in size. The lungs are clear. No pleural effusion or pneumothorax is identified. No acute osseous abnormality is seen. IMPRESSION: No active cardiopulmonary disease. Electronically Signed   By: Sebastian Ache M.D.   On: 06/06/2016 16:00  Ct Angio Chest Pe W/cm &/or Wo Cm  Result Date: 06/06/2016 CLINICAL DATA:  Increasing shortness of breath and cough for 3 weeks. History of PE. EXAM: CT ANGIOGRAPHY CHEST WITH CONTRAST TECHNIQUE: Multidetector CT imaging of the chest was performed using the standard protocol during bolus administration of intravenous contrast. Multiplanar CT image reconstructions and MIPs were obtained to evaluate the vascular  anatomy. CONTRAST:  75 cc Isovue 370 intravenously. COMPARISON:  Chest radiograph 06/06/2016, chest CT 05/28/2015 FINDINGS: Mediastinum/Lymph Nodes: No pulmonary emboli or thoracic aortic dissection identified. No masses or pathologically enlarged lymph nodes identified. The heart is normal in size. There is no pericardial effusion. Lungs/Pleura: No pulmonary mass, infiltrate, or effusion. Mild bibasilar hypoventilatory changes dependently. Upper abdomen: No acute findings. Musculoskeletal: No chest wall mass or suspicious bone lesions identified. Review of the MIP images confirms the above findings. IMPRESSION: No evidence of pulmonary embolus or other vascular abnormality. No evidence of pulmonary infiltrate. Electronically Signed   By: Ted Mcalpine M.D.   On: 06/06/2016 19:19   EKG:   Orders placed or  performed during the hospital encounter of 05/22/16  . EKG 12-Lead  . EKG 12-Lead  . EKG 12-Lead  . EKG 12-Lead  . EKG      Management plans discussed with the patient, family and they are in agreement.  CODE STATUS:  Code Status History    Date Active Date Inactive Code Status Order ID Comments User Context   06/06/2016  8:15 PM 06/07/2016  8:26 AM Full Code [id number]  Enid Baas, MD ED   03/26/2016 11:27 PM 03/29/2016  5:29 PM Full Code [id number]  Audery Amel, MD Inpatient   03/20/2015  8:04 PM 03/21/2015 12:14 AM Full Code [id number]  Altamese Dilling, MD ED      TOTAL TIME TAKING CARE OF THIS PATIENT: 40 minutes.    Katharina Caper M.D on 06/08/2016 at 12:37 PM  Between 7am to 6pm - Pager - [phone]  After 6pm go to [email or web address] - password [email or web address] [facility]  [facility]  Office  [phone]  CC: Primary care physician; Amy Diana Eves, NP

## 2016-06-08 NOTE — Progress Notes (Signed)
Myoview was normal with no ischemia. LV was normal.

## 2016-06-08 NOTE — Care Management (Signed)
No discharge needs identified by care team members 

## 2016-06-08 NOTE — Progress Notes (Signed)
Patient discharged via wheelchair and private vehicle. IV removed and catheter intact. All discharge instructions given and patient verbalizes understanding. Tele removed and returned. Prescriptions given to patient No distress noted.   

## 2016-06-09 ENCOUNTER — Ambulatory Visit: Payer: Self-pay | Admitting: Family Medicine

## 2016-06-10 ENCOUNTER — Encounter: Payer: Self-pay | Admitting: Family Medicine

## 2016-06-10 ENCOUNTER — Other Ambulatory Visit: Payer: Self-pay | Admitting: Family Medicine

## 2016-06-10 ENCOUNTER — Ambulatory Visit (INDEPENDENT_AMBULATORY_CARE_PROVIDER_SITE_OTHER): Payer: Medicare Other | Admitting: Family Medicine

## 2016-06-10 VITALS — BP 132/84 | HR 94 | Temp 98.6°F | Resp 16 | Ht 62.0 in | Wt 217.0 lb

## 2016-06-10 DIAGNOSIS — M549 Dorsalgia, unspecified: Secondary | ICD-10-CM

## 2016-06-10 DIAGNOSIS — I10 Essential (primary) hypertension: Secondary | ICD-10-CM

## 2016-06-10 DIAGNOSIS — K219 Gastro-esophageal reflux disease without esophagitis: Secondary | ICD-10-CM

## 2016-06-10 DIAGNOSIS — Z1211 Encounter for screening for malignant neoplasm of colon: Secondary | ICD-10-CM | POA: Diagnosis not present

## 2016-06-10 DIAGNOSIS — G8929 Other chronic pain: Secondary | ICD-10-CM

## 2016-06-10 DIAGNOSIS — J441 Chronic obstructive pulmonary disease with (acute) exacerbation: Secondary | ICD-10-CM | POA: Diagnosis not present

## 2016-06-10 DIAGNOSIS — F1410 Cocaine abuse, uncomplicated: Secondary | ICD-10-CM

## 2016-06-10 DIAGNOSIS — E785 Hyperlipidemia, unspecified: Secondary | ICD-10-CM

## 2016-06-10 DIAGNOSIS — E669 Obesity, unspecified: Secondary | ICD-10-CM | POA: Diagnosis not present

## 2016-06-10 DIAGNOSIS — F332 Major depressive disorder, recurrent severe without psychotic features: Secondary | ICD-10-CM

## 2016-06-10 MED ORDER — NAPROXEN 500 MG PO TABS: 500.0000 mg | ORAL_TABLET | Freq: Two times a day (BID) | ORAL | 1 refills | Status: DC

## 2016-06-10 MED ORDER — ATORVASTATIN CALCIUM 20 MG PO TABS: 20.0000 mg | ORAL_TABLET | Freq: Every day | ORAL | 3 refills | Status: DC

## 2016-06-10 MED ORDER — ESOMEPRAZOLE MAGNESIUM 40 MG PO CPDR: 40.0000 mg | DELAYED_RELEASE_CAPSULE | Freq: Every day | ORAL | 11 refills | Status: DC

## 2016-06-10 MED ORDER — BUDESONIDE-FORMOTEROL FUMARATE 160-4.5 MCG/ACT IN AERO: 2.0000 | INHALATION_SPRAY | Freq: Two times a day (BID) | RESPIRATORY_TRACT | 11 refills | Status: DC

## 2016-06-10 MED ORDER — COMBIVENT RESPIMAT 20-100 MCG/ACT IN AERS: 1.0000 | INHALATION_SPRAY | Freq: Four times a day (QID) | RESPIRATORY_TRACT | 11 refills | Status: AC | PRN

## 2016-06-10 MED ORDER — RANITIDINE HCL 150 MG PO CAPS: 150.0000 mg | ORAL_CAPSULE | Freq: Two times a day (BID) | ORAL | 11 refills | Status: DC

## 2016-06-10 NOTE — Assessment & Plan Note (Signed)
Refer pt to [facility] management for evaluation. No narcotic prescriptions given today.  Will try naproxen twice daily to help with pain until she can be seen by pain management.

## 2016-06-10 NOTE — Patient Instructions (Signed)
Try taking Zantac twice daily for your acid reflux and nexium once daily. We will check back in 1 mos to see how you are doing.  We will make you an another appt to be seen a [facility] to follow-up on your COPD. Please seek immediate medical attention if you develop shortness of breath not relieve by inhaler, chest pain/tightness, fever > 103 F or other concerning symptoms.

## 2016-06-10 NOTE — Assessment & Plan Note (Signed)
Pt denies illicit drug use. Positive cocaine abuse on [facility] drug screen on 7/24. Pt reports taking unknown medication at a friends house.

## 2016-06-10 NOTE — Assessment & Plan Note (Signed)
Start statin today  
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
## 2016-06-10 NOTE — Assessment & Plan Note (Signed)
Managed by Dr. Su 

## 2016-06-10 NOTE — Assessment & Plan Note (Signed)
Reviewed risks of long term PPI use. Will try BID H2 block and weaning off PPI. Consider EGD if symptoms not improving. Pt appears to have been scheduled for EGD in march that was never done.

## 2016-06-11 LAB — CULTURE, BLOOD (ROUTINE X 2)
CULTURE: NO GROWTH
CULTURE: NO GROWTH

## 2016-06-15 ENCOUNTER — Telehealth: Payer: Self-pay | Admitting: Family Medicine

## 2016-06-15 NOTE — Telephone Encounter (Signed)
Pt needs a refill on lasix and a prescription for a BP cuff sent to [facility] in [place].  Her call back number is [phone]

## 2016-06-15 NOTE — Telephone Encounter (Signed)
Please send lasix refill in my name. 30 pills with 2 refills. You can also send the cuff. Thanks! Ak

## 2016-06-16 ENCOUNTER — Other Ambulatory Visit: Payer: Self-pay | Admitting: Family Medicine

## 2016-06-16 DIAGNOSIS — I10 Essential (primary) hypertension: Secondary | ICD-10-CM

## 2016-06-16 DIAGNOSIS — H2513 Age-related nuclear cataract, bilateral: Secondary | ICD-10-CM | POA: Diagnosis not present

## 2016-06-16 MED ORDER — FUROSEMIDE 20 MG PO TABS: 20.0000 mg | ORAL_TABLET | Freq: Every day | ORAL | 2 refills | Status: DC

## 2016-06-16 MED ORDER — BLOOD PRESSURE CUFF MISC: 1.0000 | Freq: Every day | 0 refills | Status: AC

## 2016-06-16 MED ORDER — FUROSEMIDE 20 MG PO TABS: 20.0000 mg | ORAL_TABLET | Freq: Once | ORAL | Status: DC

## 2016-06-16 NOTE — Telephone Encounter (Signed)
Rx send

## 2016-06-23 ENCOUNTER — Encounter: Payer: Self-pay | Admitting: Family Medicine

## 2016-06-23 ENCOUNTER — Ambulatory Visit (INDEPENDENT_AMBULATORY_CARE_PROVIDER_SITE_OTHER): Payer: Medicare Other | Admitting: Family Medicine

## 2016-06-23 VITALS — BP 130/82 | HR 76 | Temp 98.4°F | Resp 18 | Ht 62.75 in | Wt 217.0 lb

## 2016-06-23 DIAGNOSIS — R350 Frequency of micturition: Secondary | ICD-10-CM | POA: Diagnosis not present

## 2016-06-23 DIAGNOSIS — K219 Gastro-esophageal reflux disease without esophagitis: Secondary | ICD-10-CM | POA: Diagnosis not present

## 2016-06-23 DIAGNOSIS — G894 Chronic pain syndrome: Secondary | ICD-10-CM | POA: Diagnosis not present

## 2016-06-23 DIAGNOSIS — J449 Chronic obstructive pulmonary disease, unspecified: Secondary | ICD-10-CM | POA: Diagnosis not present

## 2016-06-23 MED ORDER — ESOMEPRAZOLE MAGNESIUM 20 MG PO CPDR: 20.0000 mg | DELAYED_RELEASE_CAPSULE | Freq: Two times a day (BID) | ORAL | 2 refills | Status: DC

## 2016-06-23 NOTE — Assessment & Plan Note (Signed)
Patient with fairly significant history of GERD. Has responded well to Nexium in the past. Does not want to take Zantac. I discussed the risks of chronic PPI use and the risk of chronic reflux. She'll be referred to GI for further evaluation and consideration of an EGD. We'll refill her Nexium.

## 2016-06-23 NOTE — Patient Instructions (Addendum)
Nice to meet you. We will have you continue your Nexium. We will refer you to GI for further evaluation. We will refer you to pulmonology for your breathing. If you develop persistent breathing issues or chest pain please seek medical attention. We will refer you to the pain clinic as well.

## 2016-06-23 NOTE — Assessment & Plan Note (Signed)
Suspect this is related to her Lasix use. No other urinary symptoms to indicate infection. Has been going on for at least a year. Recent A1c in the normal range. She'll continue to monitor.

## 2016-06-23 NOTE — Progress Notes (Signed)
Pre visit review using our clinic review tool, if applicable. No additional management support is needed unless otherwise documented below in the visit note. 

## 2016-06-23 NOTE — Assessment & Plan Note (Signed)
Advised patient that I do not fill chronic narcotics. We'll refer her to a pain clinic.

## 2016-06-23 NOTE — Progress Notes (Signed)
Marikay Alar, MD Phone: [phone]  Rebecca Ramos is a 55 y.o. female who presents today for new patient visit.  GERD: Patient notes she has had reflux for about 20 years. Has been on Nexium twice daily for a long time. Most recent physician wanted to switch her to Zantac and she did not like this. Gets regurgitation, sour taste, and burning. Has not had an EGD or seen GI for this. Reports blood in her stool in the past about 20 years ago though none recently. Does additionally note some occasional nausea and vomiting with certain medications. She does note some hoarseness with her reflux.  COPD: Patient notes issues with her breathing. Notes when she feels an urgency to do something her chest will get tight and she will get out of breath. Can occur when she is taking care of her granddaughter. Does not usually happen at rest though happens when she gets up to do something. Was hospitalized for this recently and had a low risk stress test and an echo that was relatively unremarkable with the exception of grade 1 diastolic dysfunction. She had a CT angiogram of her chest as well that was negative for PE and pneumonia. She does have a history of PE and was on Coumadin until about 10-12 months ago. This may have been provoked as it occurred around 6 months after hysterectomy. No current issues with her breathing. She has not yet seen pulmonology for this. She has been using duo nebs every 4 hours because she states she was told to. She uses them even when she does not have any symptoms. Anxiety seems to play some role in her breathing issues. She is followed by a psychiatrist for anxiety and depression.  Patient additionally notes excessive thirst for at least a year. Notes frequent urination though she is on a fluid pill for this. No other urinary symptoms.  Patient also requests pain medication today. She has chronic pain and reports having been on oxycodone in the past. Has not seen a pain  clinic.    Active Ambulatory Problems    Diagnosis Date Noted  . HTN (hypertension) 03/20/2015  . Severe recurrent major depression without psychotic features (HCC) 03/26/2016  . Suicide attempt (HCC) 03/26/2016  . Chronic back pain 03/26/2016  . Cocaine use disorder, moderate, dependence (HCC) 03/28/2016  . Chest pain 06/06/2016  . COPD (chronic obstructive pulmonary disease) (HCC) 06/08/2016  . Acute bronchitis 06/08/2016  . Hyperlipidemia 06/08/2016  . Obesity 06/08/2016  . Cocaine abuse 06/08/2016  . Tobacco abuse counseling 06/08/2016  . GERD (gastroesophageal reflux disease) 06/10/2016  . Frequent urination 06/23/2016  . Chronic pain syndrome 06/23/2016   Resolved Ambulatory Problems    Diagnosis Date Noted  . No Resolved Ambulatory Problems   Past Medical History:  Diagnosis Date  . Acid reflux   . Anxiety   . Chronic lower back pain   . COPD (chronic obstructive pulmonary disease) (HCC)   . Depression   . Heart murmur   . History of degenerative disc disease   . HTN (hypertension) 03/20/2015  . Hypertension   . Osteoarthritis of lumbosacral spine   . Right Achilles tendinitis   . Thrombus     Family History  Problem Relation Age of Onset  . CAD Mother   . CAD Sister   . Throat cancer Brother   . CAD Maternal Grandmother   . CAD Maternal Grandfather     Social History   Social History  . Marital status:  Single    Spouse name: N/A  . Number of children: N/A  . Years of education: N/A   Occupational History  . Not on file.   Social History Main Topics  . Smoking status: Former Games developer  . Smokeless tobacco: Never Used  . Alcohol use No  . Drug use: No     Comment: Patient denies, but per previous H&P from Psych- cocaine use in the past  . Sexual activity: Yes    Birth control/ protection: Surgical   Other Topics Concern  . Not on file   Social History Narrative  . No narrative on file    ROS  General:  Negative for nexplained weight  loss, fever Skin: Negative for new or changing mole, sore that won't heal HEENT: Positive for hoarseness, Negative for trouble hearing, trouble seeing, ringing in ears, mouth sores, change in voice, dysphagia. CV:  Positive for dyspnea, chest tightness, Negative for chest pain, edema, palpitations Resp: Negative for cough, hemoptysis GI: Positive for nausea, vomiting, constipation, negative for diarrhea, abdominal pain, melena, hematochezia. GU: Positive for frequent urination, Negative for dysuria, incontinence, urinary hesitance, hematuria, vaginal or penile discharge, polyuria, sexual difficulty, lumps in testicle or breasts MSK: Positive for muscle cramps or aches, joint pain or swelling Neuro: Negative for headaches, weakness, numbness, dizziness, passing out/fainting Psych: Negative for depression, anxiety, memory problems  Objective  Physical Exam Vitals:   06/23/16 0904  BP: 130/82  Pulse: 76  Resp: 18  Temp: 98.4 F (36.9 C)    BP Readings from Last 3 Encounters:  06/23/16 130/82  06/10/16 132/84  06/08/16 118/73   Wt Readings from Last 3 Encounters:  06/23/16 217 lb (98.4 kg)  06/10/16 217 lb (98.4 kg)  06/06/16 222 lb (100.7 kg)    Physical Exam  Constitutional: No distress.  HENT:  Head: Normocephalic and atraumatic.  Mouth/Throat: Oropharynx is clear and moist. No oropharyngeal exudate.  Eyes: Conjunctivae are normal. Pupils are equal, round, and reactive to light.  Cardiovascular: Normal rate, regular rhythm and normal heart sounds.   Pulmonary/Chest: Effort normal and breath sounds normal.  Abdominal: Soft. Bowel sounds are normal. She exhibits no distension. There is no tenderness. There is no rebound and no guarding.  Musculoskeletal: She exhibits no edema.  Neurological: She is alert. Gait normal.  Skin: Skin is warm and dry. She is not diaphoretic.  Psychiatric: Mood and affect normal.     Assessment/Plan:   GERD (gastroesophageal reflux  disease) Patient with fairly significant history of GERD. Has responded well to Nexium in the past. Does not want to take Zantac. I discussed the risks of chronic PPI use and the risk of chronic reflux. She'll be referred to GI for further evaluation and consideration of an EGD. We'll refill her Nexium.  COPD (chronic obstructive pulmonary disease) (HCC) Has been previously diagnosed with COPD. Is taking Symbicort and Combivent. Discussed that she does not need to use the DuoNeb nebs every 4 hours if they're not beneficial. She should use duo nebs or albuterol if having symptoms. COPD could be the cause of her breathing issues though breathing issues could also be related to deconditioning and obesity versus her anxiety. Doubt cardiac cause given negative workup in the last several weeks. Doubt PE given negative CTA. We'll have her see pulmonology for further evaluation and continue to follow with her psychiatrist for possible anxiety component. She is given return precautions.  Frequent urination Suspect this is related to her Lasix use. No other urinary symptoms  to indicate infection. Has been going on for at least a year. Recent A1c in the normal range. She'll continue to monitor.  Chronic pain syndrome Advised patient that I do not fill chronic narcotics. We'll refer her to a pain clinic.   Orders Placed This Encounter  Procedures  . Ambulatory referral to Gastroenterology    Referral Priority:   Routine    Referral Type:   Consultation    Referral Reason:   Specialty Services Required    Number of Visits Requested:   1  . Ambulatory referral to Pulmonology    Referral Priority:   Routine    Referral Type:   Consultation    Referral Reason:   Specialty Services Required    Requested Specialty:   Pulmonary Disease    Number of Visits Requested:   1  . Ambulatory referral to Pain Clinic    Referral Priority:   Routine    Referral Type:   Consultation    Referral Reason:   Specialty  Services Required    Requested Specialty:   Pain Medicine    Number of Visits Requested:   1    Marikay AlarEric Delane Stalling, MD [facility]

## 2016-06-23 NOTE — Assessment & Plan Note (Signed)
Has been previously diagnosed with COPD. Is taking Symbicort and Combivent. Discussed that she does not need to use the DuoNeb nebs every 4 hours if they're not beneficial. She should use duo nebs or albuterol if having symptoms. COPD could be the cause of her breathing issues though breathing issues could also be related to deconditioning and obesity versus her anxiety. Doubt cardiac cause given negative workup in the last several weeks. Doubt PE given negative CTA. We'll have her see pulmonology for further evaluation and continue to follow with her psychiatrist for possible anxiety component. She is given return precautions.

## 2016-07-07 ENCOUNTER — Telehealth: Payer: Self-pay | Admitting: *Deleted

## 2016-07-07 NOTE — Telephone Encounter (Signed)
Patient had dental work done on 08/21, she has requested to have Toradol,to help with the pain .

## 2016-07-07 NOTE — Telephone Encounter (Signed)
Patient was informed that she should take her Hydrocodone that she received from dentist and not save them.  Informed her that the only way she can receive pain medication is by booking appointment.

## 2016-07-11 ENCOUNTER — Other Ambulatory Visit: Payer: Self-pay | Admitting: Family Medicine

## 2016-07-11 NOTE — Telephone Encounter (Signed)
Can we refill this? Historical provider 

## 2016-07-11 NOTE — Telephone Encounter (Signed)
Sent to pharmacy 

## 2016-07-15 ENCOUNTER — Ambulatory Visit: Payer: Self-pay | Admitting: Family Medicine

## 2016-07-15 DIAGNOSIS — Z0289 Encounter for other administrative examinations: Secondary | ICD-10-CM

## 2016-07-19 DIAGNOSIS — Z79899 Other long term (current) drug therapy: Secondary | ICD-10-CM | POA: Diagnosis not present

## 2016-07-19 DIAGNOSIS — F4310 Post-traumatic stress disorder, unspecified: Secondary | ICD-10-CM | POA: Diagnosis not present

## 2016-07-19 DIAGNOSIS — F331 Major depressive disorder, recurrent, moderate: Secondary | ICD-10-CM | POA: Diagnosis not present

## 2016-07-19 DIAGNOSIS — F410 Panic disorder [episodic paroxysmal anxiety] without agoraphobia: Secondary | ICD-10-CM | POA: Diagnosis not present

## 2016-08-01 ENCOUNTER — Other Ambulatory Visit: Payer: Self-pay | Admitting: Family Medicine

## 2016-08-01 DIAGNOSIS — M549 Dorsalgia, unspecified: Principal | ICD-10-CM

## 2016-08-01 DIAGNOSIS — G8929 Other chronic pain: Secondary | ICD-10-CM

## 2016-08-01 NOTE — Telephone Encounter (Signed)
Please advise on refills.  

## 2016-08-02 ENCOUNTER — Telehealth: Payer: Self-pay | Admitting: Family Medicine

## 2016-08-02 NOTE — Telephone Encounter (Signed)
Left detailed message, pt to call to make an appt .

## 2016-08-02 NOTE — Telephone Encounter (Signed)
Pt states she has a sore/scratchy throat with nasal congestion.  Appointment scheduled for 08/03/16.

## 2016-08-02 NOTE — Telephone Encounter (Signed)
Please let the patient know she needs an appointment to discuss these medicines. Does not appear that they are on her medication list currently.

## 2016-08-02 NOTE — Telephone Encounter (Signed)
Pt would like to know if Dr. Birdie SonsSonnenberg will call her in medication. She is having sinus problems and is congested. Please call her at [phone].

## 2016-08-03 ENCOUNTER — Ambulatory Visit: Payer: Self-pay | Admitting: Family Medicine

## 2016-08-04 ENCOUNTER — Encounter: Payer: Self-pay | Admitting: Emergency Medicine

## 2016-08-04 ENCOUNTER — Emergency Department: Payer: Medicare Other

## 2016-08-04 ENCOUNTER — Emergency Department
Admission: EM | Admit: 2016-08-04 | Discharge: 2016-08-04 | Disposition: A | Discharge status: Home / Self Care | Payer: Medicare Other | Attending: Emergency Medicine | Admitting: Emergency Medicine

## 2016-08-04 DIAGNOSIS — J449 Chronic obstructive pulmonary disease, unspecified: Secondary | ICD-10-CM | POA: Insufficient documentation

## 2016-08-04 DIAGNOSIS — W06XXXA Fall from bed, initial encounter: Secondary | ICD-10-CM | POA: Insufficient documentation

## 2016-08-04 DIAGNOSIS — S01511A Laceration without foreign body of lip, initial encounter: Secondary | ICD-10-CM | POA: Diagnosis not present

## 2016-08-04 DIAGNOSIS — Z79899 Other long term (current) drug therapy: Secondary | ICD-10-CM | POA: Diagnosis not present

## 2016-08-04 DIAGNOSIS — S0990XA Unspecified injury of head, initial encounter: Secondary | ICD-10-CM | POA: Diagnosis not present

## 2016-08-04 DIAGNOSIS — Y9289 Other specified places as the place of occurrence of the external cause: Secondary | ICD-10-CM | POA: Insufficient documentation

## 2016-08-04 DIAGNOSIS — Y999 Unspecified external cause status: Secondary | ICD-10-CM | POA: Diagnosis not present

## 2016-08-04 DIAGNOSIS — Z87891 Personal history of nicotine dependence: Secondary | ICD-10-CM | POA: Insufficient documentation

## 2016-08-04 DIAGNOSIS — I10 Essential (primary) hypertension: Secondary | ICD-10-CM | POA: Diagnosis not present

## 2016-08-04 DIAGNOSIS — Y9389 Activity, other specified: Secondary | ICD-10-CM | POA: Insufficient documentation

## 2016-08-04 MED ORDER — TRAMADOL HCL 50 MG PO TABS: 50.0000 mg | ORAL_TABLET | Freq: Four times a day (QID) | ORAL | 0 refills | Status: DC | PRN

## 2016-08-04 MED ORDER — OXYCODONE-ACETAMINOPHEN 5-325 MG PO TABS
1.0000 | ORAL_TABLET | Freq: Once | ORAL | Status: AC
Start: 2016-08-04 — End: 2016-08-04
  Administered 2016-08-04: 1 via ORAL

## 2016-08-04 MED ORDER — ONDANSETRON HCL 4 MG PO TABS
4.0000 mg | ORAL_TABLET | Freq: Once | ORAL | Status: AC
  Administered 2016-08-04: 4 mg via ORAL

## 2016-08-04 MED ORDER — ONDANSETRON HCL 4 MG PO TABS
ORAL_TABLET | ORAL | Status: AC
  Filled 2016-08-04: qty 1

## 2016-08-04 MED ORDER — LIDOCAINE-EPINEPHRINE (PF) 1 %-1:200000 IJ SOLN
INTRAMUSCULAR | Status: AC
  Administered 2016-08-04: 30 mL
  Filled 2016-08-04: qty 30

## 2016-08-04 NOTE — ED Notes (Signed)
Pt reports falling out of bed last hit mouth/lip on dresser, pt has laceration to bottom lip, lip is separated, bleeding controlled prior to arrival

## 2016-08-04 NOTE — ED Provider Notes (Addendum)
[facility] Emergency Department Provider Note   ____________________________________________   First MD Initiated Contact with Patient 08/04/16 1020     (approximate)  I have reviewed the triage vital signs and the nursing notes.   HISTORY  Chief Complaint Lip Laceration   HPI Rebecca Ramos is a 55 y.o. female with a history of acid reflux as well as hypertension and COPD who is formally on anticoagulation was presenting to the emergency department after a fall off her bed this morning. Says that she is off of it before but thinks that she hit her lip on her night table. She sustained a left lower lip laceration and said that she also has pain over her left maxillary sinus. She is unsure if she lost consciousness after hitting her head.     Past Medical History:  Diagnosis Date  . Acid reflux   . Anxiety   . Chronic lower back pain    osteoarthritis in luber spine  . COPD (chronic obstructive pulmonary disease) (HCC)   . Depression    Bipolar  . Heart murmur   . History of degenerative disc disease   . HTN (hypertension) 03/20/2015  . Hypertension   . Osteoarthritis of lumbosacral spine   . Right Achilles tendinitis   . Thrombus    right lung in 2015, off anticoagulation now    Patient Active Problem List   Diagnosis Date Noted  . Frequent urination 06/23/2016  . Chronic pain syndrome 06/23/2016  . GERD (gastroesophageal reflux disease) 06/10/2016  . COPD (chronic obstructive pulmonary disease) (HCC) 06/08/2016  . Acute bronchitis 06/08/2016  . Hyperlipidemia 06/08/2016  . Obesity 06/08/2016  . Cocaine abuse 06/08/2016  . Tobacco abuse counseling 06/08/2016  . Chest pain 06/06/2016  . Cocaine use disorder, moderate, dependence (HCC) 03/28/2016  . Severe recurrent major depression without psychotic features (HCC) 03/26/2016  . Suicide attempt (HCC) 03/26/2016  . Chronic back pain 03/26/2016  . HTN (hypertension) 03/20/2015    Past  Surgical History:  Procedure Laterality Date  . ABDOMINAL HYSTERECTOMY    . CESAREAN SECTION Bilateral   . CESAREAN SECTION Bilateral   . ELBOW SURGERY     bilateral  . thumb surgery     right   . TONSILLECTOMY      Prior to Admission medications   Medication Sig Start Date End Date Taking? Authorizing Provider  albuterol (PROVENTIL HFA;VENTOLIN HFA) 108 (90 Base) MCG/ACT inhaler Inhale 1 puff into the lungs every 4 (four) hours. 11/18/15 11/17/16  Historical Provider, MD  ALPRAZolam Prudy Feeler) 1 MG tablet  05/18/16   Historical Provider, MD  amLODipine (NORVASC) 5 MG tablet TAKE 1 TABLET BY MOUTH EVERY DAY 07/11/16   Glori Luis, MD  amoxicillin (AMOXIL) 875 MG tablet  06/21/16   Historical Provider, MD  atorvastatin (LIPITOR) 20 MG tablet Take 1 tablet (20 mg total) by mouth daily. 06/10/16   Amy Rusty Aus, NP  Blood Pressure Monitoring (BLOOD PRESSURE CUFF) MISC 1 each by Does not apply route daily. 06/16/16   Amy Rusty Aus, NP  budesonide-formoterol (SYMBICORT) 160-4.5 MCG/ACT inhaler Inhale 2 puffs into the lungs 2 (two) times daily. 06/10/16 06/10/17  Amy Lauren Krebs, NP  clindamycin (CLEOCIN T) 1 % external solution Apply topically 2 (two) times daily.    Historical Provider, MD  COMBIVENT RESPIMAT 20-100 MCG/ACT AERS respimat Inhale 1 puff into the lungs 4 (four) times daily as needed. For wheezing. 06/10/16   Amy Rusty Aus, NP  cyanocobalamin (,VITAMIN B-12,) 1000 MCG/ML injection Inject into the muscle. 01/13/16   Historical Provider, MD  dicyclomine (BENTYL) 20 MG tablet TAKE 1 TABLET BY MOUTH THREE TIMES DAILY AS NEEDED FOR CRAMPY ABDOMINAL PAIN 05/02/16 02/15/17  Historical Provider, MD  erythromycin with ethanol (EMGEL) 2 % gel Apply topically daily. Apply topically twice daily to facial rash as needed    Historical Provider, MD  esomeprazole (NEXIUM) 20 MG capsule Take 1 capsule (20 mg total) by mouth 2 (two) times daily before a meal. 06/23/16   Glori LuisEric G Sonnenberg, MD  FLUoxetine  (PROZAC) 20 MG capsule Take 1 capsule (20 mg total) by mouth daily. 03/29/16   Jimmy FootmanAndrea Hernandez-Gonzalez, MD  fluticasone (FLONASE) 50 MCG/ACT nasal spray Place 2 sprays into both nostrils daily as needed for rhinitis.     Historical Provider, MD  furosemide (LASIX) 20 MG tablet Take 1 tablet (20 mg total) by mouth daily. 06/16/16   Amy Rusty AusLauren Krebs, NP  gabapentin (NEURONTIN) 300 MG capsule Take 300 mg by mouth 3 (three) times daily. 03/18/16   Historical Provider, MD  hydrOXYzine (ATARAX/VISTARIL) 50 MG tablet Take 50 mg by mouth 4 (four) times daily as needed.    Historical Provider, MD  ipratropium-albuterol (DUONEB) 0.5-2.5 (3) MG/3ML SOLN Take 3 mLs by nebulization every 4 (four) hours as needed. 06/08/16   Katharina Caperima Vaickute, MD  levothyroxine (SYNTHROID, LEVOTHROID) 25 MCG tablet Take 25 mcg by mouth daily before breakfast. 01/24/16   Historical Provider, MD  lisinopril (PRINIVIL,ZESTRIL) 10 MG tablet Take 10 mg by mouth daily.    Historical Provider, MD  omega-3 acid ethyl esters (LOVAZA) 1 g capsule  06/17/16   Historical Provider, MD  Oxycodone HCl 10 MG TABS  05/12/16   Historical Provider, MD  PARoxetine (PAXIL) 20 MG tablet Take by mouth daily. Takes 3 tablets daily    Historical Provider, MD  Vitamin D, Ergocalciferol, (DRISDOL) 50000 units CAPS capsule Take 50,000 Units by mouth once a week. 03/16/16   Historical Provider, MD  zolpidem (AMBIEN) 10 MG tablet  06/20/16   Historical Provider, MD    Allergies Honey bee venom; Sulfacetamide sodium; Levofloxacin; Sulfa antibiotics; and Codeine  Family History  Problem Relation Age of Onset  . CAD Mother   . CAD Sister   . Throat cancer Brother   . CAD Maternal Grandmother   . CAD Maternal Grandfather     Social History Social History  Substance Use Topics  . Smoking status: Former Games developermoker  . Smokeless tobacco: Never Used  . Alcohol use No    Review of Systems Constitutional: No fever/chills Eyes: No visual changes. ENT: No sore  throat. Cardiovascular: Denies chest pain. Respiratory: Denies shortness of breath. Gastrointestinal: No abdominal pain.  No nausea, no vomiting.  No diarrhea.  No constipation. Genitourinary: Negative for dysuria. Musculoskeletal: Negative for back pain. Skin: Negative for rash. Neurological: Negative for headaches, focal weakness or numbness.  10-point ROS otherwise negative.  ____________________________________________   PHYSICAL EXAM:  VITAL SIGNS: ED Triage Vitals [08/04/16 0910]  Enc Vitals Group     BP 122/79     Pulse Rate 71     Resp 18     Temp 98.4 F (36.9 C)     Temp Source Oral     SpO2 98 %     Weight 210 lb (95.3 kg)     Height 5\' 2"  (1.575 m)     Head Circumference      Peak Flow  Pain Score 9     Pain Loc      Pain Edu?      Excl. in GC?     Constitutional: Alert and oriented. Well appearing and in no acute distress. Eyes: Conjunctivae are normal. PERRL. EOMI. Head: Atraumatic. Nose: No congestion/rhinnorhea. Mouth/Throat: Mucous membranes are moist.  Left lower lip with laceration with vertical orientation That extends down to the deep mucosal layer. The laceration involves the vermilion border. There is no active bleeding at this time. Mild tenderness to palpation. Also with the left, frontal maxillary chipped tooth to the medial aspect which the patient says is chronic and was not sustained this morning. Neck: No stridor.  No tenderness to the midline cervical spine. No deformity or step-off. Cardiovascular: Normal rate, regular rhythm. Grossly normal heart sounds.  Good peripheral circulation. Respiratory: Normal respiratory effort.  No retractions. Lungs CTAB. Gastrointestinal: Soft and nontender. No distention. No abdominal bruits. No CVA tenderness. Musculoskeletal: No lower extremity tenderness nor edema.  No joint effusions. Neurologic:  Normal speech and language. No gross focal neurologic deficits are appreciated. No gait  instability. Skin:  Skin is warm, dry and intact. No rash noted. Psychiatric: Mood and affect are normal. Speech and behavior are normal.  ____________________________________________   LABS (all labs ordered are listed, but only abnormal results are displayed)  Labs Reviewed - No data to display ____________________________________________  EKG   ____________________________________________  RADIOLOGY  CT Head Wo Contrast (Accession [id number]) (Order [id number])  Imaging  Date: 08/04/2016 Department: [facility] EMERGENCY DEPARTMENT Released By/Authorizing: Myrna Blazer, MD (auto-released)  PACS Images   Show images for CT Head Wo Contrast  Study Result   CLINICAL DATA:  lip laceration after falling off of bed this morning and hitting lip on the night dresser. Pt states she may have been "knocked out for a few minutes" because she woke up on the floor. Lower lip Laceration is gaping in appearance and scabbed over. No bleeding at this time. Pt in NAD.  EXAM: CT HEAD WITHOUT CONTRAST  CT MAXILLOFACIAL WITHOUT CONTRAST  TECHNIQUE: Multidetector CT imaging of the head and maxillofacial structures were performed using the standard protocol without intravenous contrast. Multiplanar CT image reconstructions of the maxillofacial structures were also generated.  COMPARISON:  09/17/2013 and previous  FINDINGS: CT HEAD FINDINGS  Brain: No evidence of acute infarction, hemorrhage, hydrocephalus, extra-axial collection or mass lesion/mass effect.  Vascular: No hyperdense vessel or unexpected calcification.  Skull: Normal. Negative for fracture or focal lesion.  Sinuses/Orbits: No acute finding.  Other:  CT MAXILLOFACIAL FINDINGS  Osseous: No fracture or mandibular dislocation. No destructive process.Mild degenerative change in both temporomandibular joints. Multiple missing teeth and restorations.  Orbits: Negative. No  traumatic or inflammatory finding.  Sinuses: Clear.  Soft tissues: Negative.  Limited intracranial: No significant or unexpected finding.  IMPRESSION: 1. Negative for bleed or other acute intracranial process. 2. No acute maxillofacial findings.   Electronically Signed   By: Corlis Leak M.D.   On: 08/04/2016 12:27     ____________________________________________   PROCEDURES  Procedure(s) performed:  LACERATION REPAIR Performed by: Arelia Longest Authorized by: Arelia Longest Consent: Verbal consent obtained. Risks and benefits: risks, benefits and alternatives were discussed Consent given by: patient Patient identity confirmed: provided demographic data Prepped and Draped in normal sterile fashion Wound explored  Laceration Location: Left lower lip.  Laceration Length: 3cm  No Foreign Bodies seen or palpated  Anesthesia: local infiltration  Local  anesthetic: lidocaine 1 % with epinephrine  Anesthetic total: 3 ml mental block  Irrigation method: syringe Amount of cleaning: standard  Skin closure: 2 sq vicryl, 5-0.  3 superficial nylon 5-0.  3 superficial vicryl 5-0 superior to the vermillion border  Number of sutures: 8  Technique: simple, interrupted  Patient tolerance: Patient tolerated the procedure well with no immediate complications.   Procedures  Critical Care performed:   ____________________________________________   INITIAL IMPRESSION / ASSESSMENT AND PLAN / ED COURSE  Pertinent labs & imaging results that were available during my care of the patient were reviewed by me and considered in my medical decision making (see chart for details).    Clinical Course   ----------------------------------------- 1:41 PM on 08/04/2016 -----------------------------------------  Discussed aftercare with Dr. Andee Poles of ENT who requested to see the patient in 7 days for suture removal. Explained wound care to the patient including  keeping the wound dry for the first 24 hours. The patient understands follow-up and 7 days to have her sutures removed. ____________________________________________   FINAL CLINICAL IMPRESSION(S) / ED DIAGNOSES  Fall. Lip laceration.     NEW MEDICATIONS STARTED DURING THIS VISIT:  New Prescriptions   No medications on file     Note:  This document was prepared using Dragon voice recognition software and may include unintentional dictation errors.    Myrna Blazer, MD 08/04/16 1343    Myrna Blazer, MD 08/04/16 [phone]

## 2016-08-04 NOTE — ED Triage Notes (Signed)
Pt to ED with lip laceration after falling off of bed this morning and hitting lip on the night dresser.  Pt states she may have been "knocked out for a few minutes" because she woke up on the floor.  Lower lip Laceration is gaping in appearance and scabbed over.  No bleeding at this time. Pt in NAD.

## 2016-08-05 ENCOUNTER — Telehealth: Payer: Self-pay | Admitting: Family Medicine

## 2016-08-05 ENCOUNTER — Encounter: Payer: Self-pay | Admitting: Emergency Medicine

## 2016-08-05 ENCOUNTER — Emergency Department
Admission: EM | Admit: 2016-08-05 | Discharge: 2016-08-05 | Disposition: A | Discharge status: Home / Self Care | Payer: Medicare Other | Attending: Emergency Medicine | Admitting: Emergency Medicine

## 2016-08-05 DIAGNOSIS — Z87891 Personal history of nicotine dependence: Secondary | ICD-10-CM | POA: Diagnosis not present

## 2016-08-05 DIAGNOSIS — I10 Essential (primary) hypertension: Secondary | ICD-10-CM | POA: Diagnosis not present

## 2016-08-05 DIAGNOSIS — Z79899 Other long term (current) drug therapy: Secondary | ICD-10-CM | POA: Insufficient documentation

## 2016-08-05 DIAGNOSIS — S01511S Laceration without foreign body of lip, sequela: Secondary | ICD-10-CM

## 2016-08-05 DIAGNOSIS — S01511D Laceration without foreign body of lip, subsequent encounter: Secondary | ICD-10-CM

## 2016-08-05 DIAGNOSIS — Z791 Long term (current) use of non-steroidal anti-inflammatories (NSAID): Secondary | ICD-10-CM | POA: Diagnosis not present

## 2016-08-05 DIAGNOSIS — S01511A Laceration without foreign body of lip, initial encounter: Secondary | ICD-10-CM | POA: Diagnosis not present

## 2016-08-05 DIAGNOSIS — W228XXD Striking against or struck by other objects, subsequent encounter: Secondary | ICD-10-CM | POA: Insufficient documentation

## 2016-08-05 DIAGNOSIS — J449 Chronic obstructive pulmonary disease, unspecified: Secondary | ICD-10-CM | POA: Insufficient documentation

## 2016-08-05 DIAGNOSIS — L089 Local infection of the skin and subcutaneous tissue, unspecified: Secondary | ICD-10-CM | POA: Diagnosis not present

## 2016-08-05 MED ORDER — HYDROCODONE-ACETAMINOPHEN 5-325 MG PO TABS
1.0000 | ORAL_TABLET | Freq: Once | ORAL | Status: AC
Start: 2016-08-05 — End: 2016-08-05
  Administered 2016-08-05: 1 via ORAL
  Filled 2016-08-05: qty 1

## 2016-08-05 MED ORDER — BACITRACIN ZINC 500 UNIT/GM EX OINT
TOPICAL_OINTMENT | Freq: Once | CUTANEOUS | Status: AC
  Administered 2016-08-05: 19:00:00 via TOPICAL

## 2016-08-05 MED ORDER — LIDOCAINE-EPINEPHRINE (PF) 1 %-1:200000 IJ SOLN
30.0000 mL | Freq: Once | INTRAMUSCULAR | Status: DC
  Filled 2016-08-05: qty 30

## 2016-08-05 MED ORDER — AMOXICILLIN-POT CLAVULANATE 875-125 MG PO TABS: 1.0000 | ORAL_TABLET | Freq: Two times a day (BID) | ORAL | 0 refills | Status: DC

## 2016-08-05 MED ORDER — AMOXICILLIN-POT CLAVULANATE 875-125 MG PO TABS
1.0000 | ORAL_TABLET | Freq: Once | ORAL | Status: AC
  Administered 2016-08-05: 1 via ORAL
  Filled 2016-08-05: qty 1

## 2016-08-05 NOTE — Telephone Encounter (Signed)
Pt called about wanting to get a referral to see the plastic surgeon. Pt states the stitches are starting to come out and it's getting worse. Please advise?  Call pt @ [phone]. Thank you!

## 2016-08-05 NOTE — ED Triage Notes (Signed)
Patient presents to the ED with lip laceration. Laceration is gaping and scabbed over. Broken/cut sutures noted protruding from the wound. Patient has odor of ETOH on her breath. Patient states "I was here two days ago and my doctor says tramadol isnt enough"

## 2016-08-05 NOTE — Telephone Encounter (Signed)
TH called and stated that patient will be going to ER due to four sutures coming out of mouth. TH was wondering if Dr. Birdie SonsSonnenberg had and recommendations as to a surgeon for patient. Please advise.

## 2016-08-05 NOTE — Consult Note (Signed)
..   Rebecca Ramos, Rebecca Ramos [id number] [birth year] Rebecca Ramos Rebecca Ramos,*  Reason for Consult: complex lip laceration  HPI: 55 y.o. Female s/p injury on 08/03/2016 to lower lip.  Reports hit face on crib corner.  Presented to ED on 08/04/2016 and closed in ER with follow up at my office next week.  Presented to ED again today reporting that lip opened up completely without injury.  Reports lip pain.  Denies any trauma or manipulation of lip.  Allergies:  Allergies  Allergen Reactions  . Honey Bee Venom Anaphylaxis  . Sulfacetamide Sodium Hives and Swelling  . Levofloxacin Nausea Only and Rash  . Sulfa Antibiotics Hives and Swelling  . Codeine Rash    ROS: Review of systems normal other than 12 systems except per HPI.  PMH:  Past Medical History:  Diagnosis Date  . Acid reflux   . Anxiety   . Chronic lower back pain    osteoarthritis in luber spine  . COPD (chronic obstructive pulmonary disease) (HCC)   . Depression    Bipolar  . Heart murmur   . History of degenerative disc disease   . HTN (hypertension) 03/20/2015  . Hypertension   . Osteoarthritis of lumbosacral spine   . Right Achilles tendinitis   . Thrombus    right lung in 2015, off anticoagulation now    FH:  Family History  Problem Relation Age of Onset  . CAD Mother   . CAD Sister   . Throat cancer Brother   . CAD Maternal Grandmother   . CAD Maternal Grandfather     SH:  Social History   Social History  . Marital status: Single    Spouse name: N/A  . Number of children: N/A  . Years of education: N/A   Occupational History  . Not on file.   Social History Main Topics  . Smoking status: Former Games developermoker  . Smokeless tobacco: Never Used  . Alcohol use No  . Drug use: No     Comment: Patient denies, but per previous H&P from Psych- cocaine use in the past  . Sexual activity: Yes    Birth control/ protection: Surgical   Other Topics Concern  . Not on file   Social History Narrative  . No narrative on  file    PSH:  Past Surgical History:  Procedure Laterality Date  . ABDOMINAL HYSTERECTOMY    . CESAREAN SECTION Bilateral   . CESAREAN SECTION Bilateral   . ELBOW SURGERY     bilateral  . thumb surgery     right   . TONSILLECTOMY      Physical  Exam:  GEN-  CN 2-12 grossly intact and symmetric. EARS-  External ears clear with no otorrhea OC/OP-  Complex lip laceration 7cm in total length with two avulsions in an X pattern involving the vermillion border on bilateral lower X arms.  Extending into oral cavity portion of lower lip at upper X arms.  Necrotic fibrinous exudate present throughout. EXT-  Skin warm and dry.  NOSE- Nasal cavity without polyps or purulence. External nose and ears without masses or lesions. EYE   EOMI, PERRLA.  NECK-  Neck supple with no masses or lesions. No lymphadenopathy palpated. Thyroid normal with no masses. RESP- unlabored resp CARD-  Regular pulse  A/P: Complex lip laceration with necrotic tissue  Plan:  Complex lip closure after debridement.  Oral abx.  Follow up in 1 week.   Rebecca Ramos 08/05/2016 6:20 PM

## 2016-08-05 NOTE — Op Note (Signed)
..  08/05/2016 6:26 PM    Durward ParcelGaddy, [place]  [id number]   Pre-Op Dx:  Complex lip laceration involving lower lip 7cm including vermilion border  Post-op Dx: same  Proc:  1)  Wound debridement  2)  Complex lip closure of lower lip laceration of 7cm involving vermillion border and oral mucosa  Surg:  Tauno Falotico  Anes:  GOT  EBL:  5  Comp:  0  Findings:  Successful closure after debridement of necrotic tissue  Procedure: With the patient in a comfortable supine position, the patient's lower lip was anesthetized with 5cc's of 1% Lidocaine with 1:100,000 Epinephrine.  The patients lip was next prepped and draped in a sterile fashion.  At this time, the patient's lower lip was debrided of necrotic tissue and fibrinous debris.  Avascular tissue wedge was removed from bilateral wound edges creating a straight laceration.  Necrotic tissue was removed from the wound bed deep as well as superficially in the patient's oral cavity.  The wound was cleaned with sterile saline.  At this time, attention was directed to the complex wound closure.  Multiple deep 4.0 vicryl sutures were placed bringing the edges of the vermilion border together.  Next another 4 deep vicryl sutures were placed mid lip as well as superior length for wound strength.  At this time with wound edges reapproximated, multiple 5.0 chromic interupted sutures were placed along the mucosal lip down to the vermilion border.  The skin was closed with 6.0 Prolene.  The wound was cleaned with sterile saline.  Dispo: Home  Plan:  Hydration, antibiosis, OTC analgesia.   Use of antibiotic ointment covering at all times.  Follow up in 1 week for suture removal.  Mable Lashley 08/05/2016 6:26 PM

## 2016-08-05 NOTE — Telephone Encounter (Signed)
Patients significant other answered the phone and stated patient has already left for the hospital.

## 2016-08-05 NOTE — Telephone Encounter (Signed)
Noted  

## 2016-08-05 NOTE — Telephone Encounter (Signed)
I can place a referral to plastic surgery for her though if her stitches are coming out she needs to go back to the ED.

## 2016-08-05 NOTE — Telephone Encounter (Signed)
FYI

## 2016-08-05 NOTE — Telephone Encounter (Signed)
Left message for patient to return call back.  

## 2016-08-05 NOTE — Discharge Instructions (Signed)
Take the antibiotic as directed. Follow-up with Dr. Andee PolesVaught in 1 week for suture removal. Keep the wound clean, dry, and covered with triple antibiotic ointment. Take your home pain medicines as previously prescribed.

## 2016-08-05 NOTE — ED Provider Notes (Signed)
[facility] Emergency Department Provider Note ____________________________________________  Time seen: 1559  I have reviewed the triage vital signs and the nursing notes.  HISTORY  Chief Complaint  Lip Laceration  HPI Rebecca Ramos is a 55 y.o. female presents to the ED for reevaluation of a previously sutured lip laceration. The patient was evaluated yesterday by my attending provider for a lip laceration that she claims happened yesterday. She describes sleeping in her bed with her small grandchild in between her and her husband. She describes being closer to the [facility] she recalled and reports rolled out of the bed hitting her lip on the nightstand. She was subsequently treated with suture repair to the lip laceration. She returns today noting that the wound has opened up and the sutures have broken. She also reports that the tramadol that she was discharged with was not sufficient for her pain according to her and her primary care provider. Patient denies any reinjury or trauma. She describes the lips being severely dry and chapped following suture repair and when she went to eat and/or eon the wound opened up again. He denies any intermittent fevers, chills, sweats.  Past Medical History:  Diagnosis Date  . Acid reflux   . Anxiety   . Chronic lower back pain    osteoarthritis in luber spine  . COPD (chronic obstructive pulmonary disease) (HCC)   . Depression    Bipolar  . Heart murmur   . History of degenerative disc disease   . HTN (hypertension) 03/20/2015  . Hypertension   . Osteoarthritis of lumbosacral spine   . Right Achilles tendinitis   . Thrombus    right lung in 2015, off anticoagulation now    Patient Active Problem List   Diagnosis Date Noted  . Frequent urination 06/23/2016  . Chronic pain syndrome 06/23/2016  . GERD (gastroesophageal reflux disease) 06/10/2016  . COPD (chronic obstructive pulmonary disease) (HCC) 06/08/2016  . Acute  bronchitis 06/08/2016  . Hyperlipidemia 06/08/2016  . Obesity 06/08/2016  . Cocaine abuse 06/08/2016  . Tobacco abuse counseling 06/08/2016  . Chest pain 06/06/2016  . Cocaine use disorder, moderate, dependence (HCC) 03/28/2016  . Severe recurrent major depression without psychotic features (HCC) 03/26/2016  . Suicide attempt (HCC) 03/26/2016  . Chronic back pain 03/26/2016  . HTN (hypertension) 03/20/2015    Past Surgical History:  Procedure Laterality Date  . ABDOMINAL HYSTERECTOMY    . CESAREAN SECTION Bilateral   . CESAREAN SECTION Bilateral   . ELBOW SURGERY     bilateral  . thumb surgery     right   . TONSILLECTOMY      Prior to Admission medications   Medication Sig Start Date End Date Taking? Authorizing Provider  Oxycodone HCl 10 MG TABS 4 (four) times daily.  05/12/16  Yes Historical Provider, MD  traMADol (ULTRAM) 50 MG tablet Take 1 tablet (50 mg total) by mouth every 6 (six) hours as needed for moderate pain or severe pain. 08/04/16 08/04/17 Yes Myrna Blazer, MD  albuterol (PROVENTIL HFA;VENTOLIN HFA) 108 (90 Base) MCG/ACT inhaler Inhale 1 puff into the lungs every 4 (four) hours. 11/18/15 11/17/16  Historical Provider, MD  ALPRAZolam Prudy Feeler) 1 MG tablet  05/18/16   Historical Provider, MD  amLODipine (NORVASC) 5 MG tablet TAKE 1 TABLET BY MOUTH EVERY DAY 07/11/16   Glori Luis, MD  amoxicillin (AMOXIL) 875 MG tablet  06/21/16   Historical Provider, MD  amoxicillin-clavulanate (AUGMENTIN) 875-125 MG tablet Take 1 tablet  by mouth 2 (two) times daily. 08/05/16   Joud Ingwersen V Bacon Shakeria Robinette, PA-C  atorvastatin (LIPITOR) 20 MG tablet Take 1 tablet (20 mg total) by mouth daily. 06/10/16   Amy Rusty AusLauren Krebs, NP  Blood Pressure Monitoring (BLOOD PRESSURE CUFF) MISC 1 each by Does not apply route daily. 06/16/16   Amy Rusty AusLauren Krebs, NP  budesonide-formoterol (SYMBICORT) 160-4.5 MCG/ACT inhaler Inhale 2 puffs into the lungs 2 (two) times daily. 06/10/16 06/10/17  Amy Lauren Krebs, NP   clindamycin (CLEOCIN T) 1 % external solution Apply topically 2 (two) times daily.    Historical Provider, MD  COMBIVENT RESPIMAT 20-100 MCG/ACT AERS respimat Inhale 1 puff into the lungs 4 (four) times daily as needed. For wheezing. 06/10/16   Amy Rusty AusLauren Krebs, NP  cyanocobalamin (,VITAMIN B-12,) 1000 MCG/ML injection Inject into the muscle. 01/13/16   Historical Provider, MD  dicyclomine (BENTYL) 20 MG tablet TAKE 1 TABLET BY MOUTH THREE TIMES DAILY AS NEEDED FOR CRAMPY ABDOMINAL PAIN 05/02/16 02/15/17  Historical Provider, MD  erythromycin with ethanol (EMGEL) 2 % gel Apply topically daily. Apply topically twice daily to facial rash as needed    Historical Provider, MD  esomeprazole (NEXIUM) 20 MG capsule Take 1 capsule (20 mg total) by mouth 2 (two) times daily before a meal. 06/23/16   Glori LuisEric G Sonnenberg, MD  FLUoxetine (PROZAC) 20 MG capsule Take 1 capsule (20 mg total) by mouth daily. 03/29/16   Jimmy FootmanAndrea Hernandez-Gonzalez, MD  fluticasone (FLONASE) 50 MCG/ACT nasal spray Place 2 sprays into both nostrils daily as needed for rhinitis.     Historical Provider, MD  furosemide (LASIX) 20 MG tablet Take 1 tablet (20 mg total) by mouth daily. 06/16/16   Amy Rusty AusLauren Krebs, NP  gabapentin (NEURONTIN) 300 MG capsule Take 300 mg by mouth 3 (three) times daily. 03/18/16   Historical Provider, MD  hydrOXYzine (ATARAX/VISTARIL) 50 MG tablet Take 50 mg by mouth 4 (four) times daily as needed.    Historical Provider, MD  ipratropium-albuterol (DUONEB) 0.5-2.5 (3) MG/3ML SOLN Take 3 mLs by nebulization every 4 (four) hours as needed. 06/08/16   Katharina Caperima Vaickute, MD  levothyroxine (SYNTHROID, LEVOTHROID) 25 MCG tablet Take 25 mcg by mouth daily before breakfast. 01/24/16   Historical Provider, MD  lisinopril (PRINIVIL,ZESTRIL) 10 MG tablet Take 10 mg by mouth daily.    Historical Provider, MD  omega-3 acid ethyl esters (LOVAZA) 1 g capsule  06/17/16   Historical Provider, MD  PARoxetine (PAXIL) 20 MG tablet Take by mouth daily.  Takes 3 tablets daily    Historical Provider, MD  Vitamin D, Ergocalciferol, (DRISDOL) 50000 units CAPS capsule Take 50,000 Units by mouth once a week. 03/16/16   Historical Provider, MD  zolpidem (AMBIEN) 10 MG tablet  06/20/16   Historical Provider, MD    Allergies Honey bee venom; Sulfacetamide sodium; Levofloxacin; Sulfa antibiotics; and Codeine  Family History  Problem Relation Age of Onset  . CAD Mother   . CAD Sister   . Throat cancer Brother   . CAD Maternal Grandmother   . CAD Maternal Grandfather     Social History Social History  Substance Use Topics  . Smoking status: Former Games developermoker  . Smokeless tobacco: Never Used  . Alcohol use No    Review of Systems  Constitutional: Negative for fever. Eyes: Negative for visual changes. ENT: Negative for sore throat. Lower lip laceration dehiscence as above.  Skin: Negative for rash. Neurological: Negative for headaches, focal weakness or numbness. ____________________________________________  PHYSICAL EXAM:  VITAL SIGNS: ED Triage Vitals  Enc Vitals Group     BP 08/05/16 1546 138/71     Pulse Rate 08/05/16 1546 80     Resp 08/05/16 1546 18     Temp 08/05/16 1546 97.8 F (36.6 C)     Temp Source 08/05/16 1546 Oral     SpO2 08/05/16 1546 96 %     Weight 08/05/16 1546 200 lb (90.7 kg)     Height 08/05/16 1546 5\' 2"  (1.575 m)     Head Circumference --      Peak Flow --      Pain Score 08/05/16 1552 9     Pain Loc --      Pain Edu? --      Excl. in GC? --    Constitutional: Alert and oriented. Well appearing and in no distress. Head: Normocephalic and atraumatic. Eyes: Conjunctivae are normal. PERRL. Normal extraocular movements Mouth/Throat: Mucous membranes are moist. Lower lip with complex laceration currently with wound edges scabbed and dehisced. Local eschar noted to the exposed edges. Nylon and vicryl sutures are noted in the wound. Respiratory: Normal respiratory effort.  Musculoskeletal: Nontender with  normal range of motion in all extremities.  Neurologic:  Normal gait without ataxia. Normal speech and language. No gross focal neurologic deficits are appreciated. Skin:  Skin is warm, dry and intact. No rash noted. Psychiatric: Mood and affect are normal. Patient exhibits appropriate insight and judgment. Patient appears slow to respond at times.  ____________________________________________  PROCEDURES  Augmentin 875 mg PO Norco 5-325 mg PO Bacitracin ointment applied post procedure ____________________________________________  INITIAL IMPRESSION / ASSESSMENT AND PLAN / ED COURSE  Patient with a complex lip laceration showing signs of dehiscence and local infection and suture repair 24-hours prior.   ----------------------------------------- 4:35 PM on 08/05/2016 -----------------------------------------  Dr. Andee Poles is consulted and he agrees to come into the ED to repair the patient's wound.  Clinical Course   Patient's complex lip wound is debrided and repaired with subcutaneous and dermal sutures. The patient is discharged with a prescription for Augmentin 2 doses directed. a narcotic pain medicines are provided. She will follow with Dr. Andee Poles in one week for wound check and suture removal. ____________________________________________  FINAL CLINICAL IMPRESSION(S) / ED DIAGNOSES  Final diagnoses:  Lip laceration, sequela  Infected lip laceration, subsequent encounter  Complicated laceration of lip, sequela      Lissa Hoard, PA-C 08/05/16 1911    Myrna Blazer, MD 08/06/16 [phone]

## 2016-08-05 NOTE — ED Notes (Signed)
See triage note for assessment 

## 2016-08-05 NOTE — Telephone Encounter (Signed)
TELEPHONE ADVICE RECORD [facility]  Patient Name: Rebecca Ramos  DOB: 1961/06/28    Initial Comment caller states she fell and hit her nightstand and has a hole in her bottom lip, that needed 13 stitches, the stitches are coming out one by one and she is crying in pain, they gave her a script for 10 Ultram in ER but she can't find the script   Nurse Assessment  Nurse: Odis LusterBowers, RN, Bjorn Loserhonda Date/Time (Eastern Time): 08/05/2016 2:57:36 PM  Confirm and document reason for call. If symptomatic, describe symptoms. You must click the next button to save text entered. ---caller states she fell and hit her nightstand and has a hole in her bottom lip, that needed 13 stitches, the stitches are coming out one by one, she is having bleeding and she is crying in pain, they gave her a script for 10 Ultram in ER but she can't find the script.  Has the patient traveled out of the country within the last 30 days? ---Not Applicable  Does the patient have any new or worsening symptoms? ---Yes  Will a triage be completed? ---Yes  Related visit to physician within the last 2 weeks? ---Yes  Does the PT have any chronic conditions? (i.e. diabetes, asthma, etc.) ---Unknown  Is the patient pregnant or possibly pregnant? (Ask all females between the ages of 6312-55) ---No  Is this a behavioral health or substance abuse call? ---No     Guidelines    Guideline Title Affirmed Question Affirmed Notes  Suture or Staple Questions [1] Suture came out early AND [2] wound gaping AND [3] < 48 hours since sutures placed    Final Disposition User   Go to ED Now (or PCP triage) Odis LusterBowers, RN, Bjorn Loserhonda    Comments  Attempted to contact MD regarding plastic surgeon referral, but was placed on hold for extended period of time (urgent caller waiting). Caller would like MD office to call her back about this at the earliest possible convenience please.   Referrals  [facility] - ED   Disagree/Comply:  Comply

## 2016-08-05 NOTE — Telephone Encounter (Signed)
Mal AmabileBrock is talking with patient

## 2016-08-08 ENCOUNTER — Telehealth: Payer: Self-pay | Admitting: Family Medicine

## 2016-08-08 NOTE — Telephone Encounter (Signed)
Pt called stated that she went to the ER to have her lip restitched. She is now complaining that her feet are swelling since her ER visit. Scheduled and appt for tomorrow.  Thank you!  Call pt [phone]

## 2016-08-08 NOTE — Telephone Encounter (Signed)
Noted  

## 2016-08-08 NOTE — Telephone Encounter (Signed)
FYI

## 2016-08-09 ENCOUNTER — Telehealth: Payer: Self-pay

## 2016-08-09 ENCOUNTER — Encounter: Payer: Self-pay | Admitting: Family

## 2016-08-09 ENCOUNTER — Ambulatory Visit (INDEPENDENT_AMBULATORY_CARE_PROVIDER_SITE_OTHER): Payer: Medicare Other | Admitting: Family

## 2016-08-09 VITALS — BP 124/78 | HR 91 | Temp 96.0°F | Wt 230.5 lb

## 2016-08-09 DIAGNOSIS — R200 Anesthesia of skin: Secondary | ICD-10-CM

## 2016-08-09 DIAGNOSIS — R600 Localized edema: Secondary | ICD-10-CM | POA: Insufficient documentation

## 2016-08-09 DIAGNOSIS — M7989 Other specified soft tissue disorders: Secondary | ICD-10-CM

## 2016-08-09 DIAGNOSIS — R110 Nausea: Secondary | ICD-10-CM | POA: Diagnosis not present

## 2016-08-09 DIAGNOSIS — J449 Chronic obstructive pulmonary disease, unspecified: Secondary | ICD-10-CM

## 2016-08-09 DIAGNOSIS — R208 Other disturbances of skin sensation: Secondary | ICD-10-CM

## 2016-08-09 NOTE — Assessment & Plan Note (Signed)
Chronic. Taking lasix. Wells DVT score 0. Wells PE score 1.5, low risk. I had ordered labs and went back to room to discuss with patient whether we should include a CTA chest based on her concern and she had left building. CMA has called patient at this time as concern she may have thought visit was complete. I went ahead and changed same day labs to future to ensure that we are able to collect. I have ordered stat bilateral duplex to evaluate for DVT. I have low clinical suspicion of this based on my exam and Wells criteria. Advised continued lasix therapy, pended CMP to check electrolytes, and compression hose.

## 2016-08-09 NOTE — Patient Instructions (Signed)
Continue to elevate legs. You buy compression stockings as well.  Low salt diet.   Please continue to stay vigilant regarding lip healing and follow up with Dr. Andee PolesVaught in 2 days.   If there is no improvement in your symptoms, or if there is any worsening of symptoms, or if you have any additional concerns, please return for re-evaluation; or, if we are closed, consider going to the Emergency Room for evaluation if symptoms urgent.  Peripheral Edema You have swelling in your legs (peripheral edema). This swelling is due to excess accumulation of salt and water in your body. Edema may be a sign of heart, kidney or liver disease, or a side effect of a medication. It may also be due to problems in the leg veins. Elevating your legs and using special support stockings may be very helpful, if the cause of the swelling is due to poor venous circulation. Avoid long periods of standing, whatever the cause. Treatment of edema depends on identifying the cause. Chips, pretzels, pickles and other salty foods should be avoided. Restricting salt in your diet is almost always needed. Water pills (diuretics) are often used to remove the excess salt and water from your body via urine. These medicines prevent the kidney from reabsorbing sodium. This increases urine flow. Diuretic treatment may also result in lowering of potassium levels in your body. Potassium supplements may be needed if you have to use diuretics daily. Daily weights can help you keep track of your progress in clearing your edema. You should call your caregiver for follow up care as recommended. SEEK IMMEDIATE MEDICAL CARE IF:   You have increased swelling, pain, redness, or heat in your legs.  You develop shortness of breath, especially when lying down.  You develop chest or abdominal pain, weakness, or fainting.  You have a fever.   This information is not intended to replace advice given to you by your health care provider. Make sure you  discuss any questions you have with your health care provider.   Document Released: 12/08/2004 Document Revised: 01/23/2012 Document Reviewed: 05/13/2015 Elsevier Interactive Patient Education [employer]2016 [employer] Inc.

## 2016-08-09 NOTE — Assessment & Plan Note (Signed)
Chronic. Reports nausea from pain medication. No vomiting. I wanted to discuss with patient in more detail her nausea and what medications she had taken in the past, however patient had left room.

## 2016-08-09 NOTE — Telephone Encounter (Signed)
Left message to return call back.  Trying to understand if patient decided to leave without finishing appointment or if there was a misunderstanding and she thought she was allowed to leave. Provider was unable to finish appointment due to patient leaving early.

## 2016-08-09 NOTE — Progress Notes (Signed)
Subjective:    Patient ID: Rebecca Ramos, female    DOB: 06-14-1961, 55 y.o.   MRN: [id number]  CC: Rebecca Ramos is a 55 y.o. female who presents today for an acute visit.    HPI: Patient here for acute visit for LE swelling 4-5 days. States it is chronic and has been taken 20 mg lasix daily.  H/o PE and was on coumadin. No SOB. Endorses orthopnea, chronic cough, mild numbness in bilateral dorsal aspect of feet. Describes chronic cough as dry, hacking. No fever, chills, no increased sputum.  COPD: Chronic. Saw PCP 06/2016 and Referral placed to pulmonologist.   Notes that lip laceration swelling improving. Sates appointment with Dr. Andee Poles in 2 days.   Per chart review, 9/21 lip laceration seen ER. 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 Went to ED again 9/22 after sutures broken. Discharged on augmentin, narcotic pain medication. Follow up with Dr Andee Poles for wound check 9/29 and suture removal.   Requests pain medication for chronic pain and medication for nausea to be taken with pain medication.  She has been referred to Pain Management by PCP.      HISTORY:  Past Medical History:  Diagnosis Date  . Acid reflux   . Anxiety   . Chronic lower back pain    osteoarthritis in luber spine  . COPD (chronic obstructive pulmonary disease) (HCC)   . Depression    Bipolar  . Heart murmur   . History of degenerative disc disease   . HTN (hypertension) 03/20/2015  . Hypertension   . Osteoarthritis of lumbosacral spine   . Right Achilles tendinitis   . Thrombus    right lung in 2015, off anticoagulation now   Past Surgical History:  Procedure Laterality Date  . ABDOMINAL HYSTERECTOMY    . CESAREAN SECTION Bilateral   . CESAREAN SECTION Bilateral   . ELBOW SURGERY     bilateral  . thumb surgery     right   . TONSILLECTOMY     Family History  Problem Relation Age of Onset  . CAD Mother   . CAD Sister   . Throat cancer Brother   . CAD Maternal Grandmother   . CAD Maternal  Grandfather     Allergies: Honey bee venom; Sulfacetamide sodium; Levofloxacin; Sulfa antibiotics; and Codeine Current Outpatient Prescriptions on File Prior to Visit  Medication Sig Dispense Refill  . albuterol (PROVENTIL HFA;VENTOLIN HFA) 108 (90 Base) MCG/ACT inhaler Inhale 1 puff into the lungs every 4 (four) hours.    . ALPRAZolam (XANAX) 1 MG tablet     . amLODipine (NORVASC) 5 MG tablet TAKE 1 TABLET BY MOUTH EVERY DAY 90 tablet 3  . amoxicillin (AMOXIL) 875 MG tablet     . amoxicillin-clavulanate (AUGMENTIN) 875-125 MG tablet Take 1 tablet by mouth 2 (two) times daily. 20 tablet 0  . atorvastatin (LIPITOR) 20 MG tablet Take 1 tablet (20 mg total) by mouth daily. 90 tablet 3  . Blood Pressure Monitoring (BLOOD PRESSURE CUFF) MISC 1 each by Does not apply route daily. 1 each 0  . budesonide-formoterol (SYMBICORT) 160-4.5 MCG/ACT inhaler Inhale 2 puffs into the lungs 2 (two) times daily. 1 Inhaler 11  . clindamycin (CLEOCIN T) 1 % external solution Apply topically 2 (two) times daily.    . COMBIVENT RESPIMAT 20-100 MCG/ACT AERS respimat Inhale 1 puff into the lungs 4 (four) times daily as needed. For wheezing. 1 Inhaler 11  . cyanocobalamin (,VITAMIN B-12,) 1000 MCG/ML injection  Inject into the muscle.    . dicyclomine (BENTYL) 20 MG tablet TAKE 1 TABLET BY MOUTH THREE TIMES DAILY AS NEEDED FOR CRAMPY ABDOMINAL PAIN    . erythromycin with ethanol (EMGEL) 2 % gel Apply topically daily. Apply topically twice daily to facial rash as needed    . esomeprazole (NEXIUM) 20 MG capsule Take 1 capsule (20 mg total) by mouth 2 (two) times daily before a meal. 180 capsule 2  . FLUoxetine (PROZAC) 20 MG capsule Take 1 capsule (20 mg total) by mouth daily. 30 capsule 0  . fluticasone (FLONASE) 50 MCG/ACT nasal spray Place 2 sprays into both nostrils daily as needed for rhinitis.     . furosemide (LASIX) 20 MG tablet Take 1 tablet (20 mg total) by mouth daily. 30 tablet 2  . gabapentin (NEURONTIN) 300  MG capsule Take 300 mg by mouth 3 (three) times daily.  0  . hydrOXYzine (ATARAX/VISTARIL) 50 MG tablet Take 50 mg by mouth 4 (four) times daily as needed.    Marland Kitchen. ipratropium-albuterol (DUONEB) 0.5-2.5 (3) MG/3ML SOLN Take 3 mLs by nebulization every 4 (four) hours as needed. 360 mL 0  . lisinopril (PRINIVIL,ZESTRIL) 10 MG tablet Take 10 mg by mouth daily.    Marland Kitchen. omega-3 acid ethyl esters (LOVAZA) 1 g capsule     . Oxycodone HCl 10 MG TABS 4 (four) times daily.     Marland Kitchen. PARoxetine (PAXIL) 20 MG tablet Take by mouth daily. Takes 3 tablets daily    . traMADol (ULTRAM) 50 MG tablet Take 1 tablet (50 mg total) by mouth every 6 (six) hours as needed for moderate pain or severe pain. 10 tablet 0  . Vitamin D, Ergocalciferol, (DRISDOL) 50000 units CAPS capsule Take 50,000 Units by mouth once a week.  11  . zolpidem (AMBIEN) 10 MG tablet   3   Current Facility-Administered Medications on File Prior to Visit  Medication Dose Route Frequency Provider Last Rate Last Dose  . guaiFENesin (MUCINEX) 12 hr tablet 600 mg  600 mg Oral BID Katharina Caperima Vaickute, MD        Social History  Substance Use Topics  . Smoking status: Former Games developermoker  . Smokeless tobacco: Never Used  . Alcohol use No    Review of Systems  Constitutional: Negative for chills and fever.  Respiratory: Positive for cough. Negative for shortness of breath and wheezing.   Cardiovascular: Positive for leg swelling. Negative for chest pain and palpitations.  Gastrointestinal: Positive for nausea. Negative for vomiting.  Skin: Positive for wound.  Neurological: Positive for numbness.      Objective:    BP 124/78 (BP Location: Left Arm, Patient Position: Sitting, Cuff Size: Large)   Pulse 91   Temp (!) 96 F (35.6 C) (Oral)   Wt 230 lb 8 oz (104.6 kg)   SpO2 96%   BMI 42.16 kg/m    Physical Exam  Constitutional: She appears well-developed and well-nourished.  HENT:  Head:    Mouth/Throat:    Laceration left lower lip. Well  approximated. Sutures intact. No purulent discharge.  Eyes: Conjunctivae are normal.  Cardiovascular: Normal rate, regular rhythm, normal heart sounds and normal pulses.   Pulmonary/Chest: Effort normal and breath sounds normal. She has no wheezes. She has no rhonchi. She has no rales.  Neurological: She is alert.  Skin: Skin is warm and dry.  Psychiatric: She has a normal mood and affect. Her speech is normal and behavior is normal. Thought content normal.  Vitals reviewed.  Assessment & Plan:   Problem List Items Addressed This Visit      Respiratory   COPD (chronic obstructive pulmonary disease) (HCC)    Stable, Chronic. Afebrile. No increased sputum.SaO2 96%. No adventitious lung sounds. Encouraged patient to make an appointment with pulmonologist.        Other   Nausea without vomiting    Chronic. Reports nausea from pain medication. No vomiting. I wanted to discuss with patient in more detail her nausea and what medications she had taken in the past, however patient had left room.       Leg swelling    Chronic. Taking lasix. Wells DVT score 0. Wells PE score 1.5, low risk. I had ordered labs and went back to room to discuss with patient whether we should include a CTA chest based on her concern and she had left building. CMA has called patient at this time as concern she may have thought visit was complete. I went ahead and changed same day labs to future to ensure that we are able to collect. I have ordered stat bilateral duplex to evaluate for DVT. I have low clinical suspicion of this based on my exam and Wells criteria. Advised continued lasix therapy, pended CMP to check electrolytes, and compression hose.        Other Visit Diagnoses    Localized swelling of lower extremity    -  Primary   Relevant Orders   CBC with Differential/Platelet   Comprehensive metabolic panel   VAS [place] LOWER EXTREMITY VENOUS (DVT)   Numbness of feet       Relevant Orders   Hemoglobin  A1c   B12 and Folate Panel   TSH     96%. No    I have discontinued Ms. Aman levothyroxine. I am also having her maintain her lisinopril, fluticasone, Vitamin D (Ergocalciferol), gabapentin, FLUoxetine, albuterol, ipratropium-albuterol, cyanocobalamin, dicyclomine, ALPRAZolam, Oxycodone HCl, budesonide-formoterol, COMBIVENT RESPIMAT, atorvastatin, furosemide, Blood Pressure Cuff, amoxicillin, omega-3 acid ethyl esters, zolpidem, PARoxetine, hydrOXYzine, erythromycin with ethanol, clindamycin, esomeprazole, amLODipine, traMADol, and amoxicillin-clavulanate. We will continue to administer guaiFENesin.   No orders of the defined types were placed in this encounter.   Return precautions given.   Risks, benefits, and alternatives of the medications and treatment plan prescribed today were discussed, and patient expressed understanding.   Education regarding symptom management and diagnosis given to patient on AVS.  Continue to follow with Marikay Alar, MD for routine health maintenance.   Rebecca Ramos and I agreed with plan.   Rennie Plowman, FNP

## 2016-08-09 NOTE — Assessment & Plan Note (Addendum)
Stable, Chronic. Afebrile. No increased sputum.SaO2 96%. No adventitious lung sounds. Encouraged patient to make an appointment with pulmonologist.

## 2016-08-11 ENCOUNTER — Telehealth: Payer: Self-pay | Admitting: Family Medicine

## 2016-08-11 DIAGNOSIS — R600 Localized edema: Secondary | ICD-10-CM

## 2016-08-11 NOTE — Telephone Encounter (Signed)
Noted. Please attempt to call the patient again tomorrow regarding this.

## 2016-08-11 NOTE — Telephone Encounter (Signed)
Called pt again at 2:40 & 2:51 with no luck

## 2016-08-11 NOTE — Telephone Encounter (Signed)
Tried calling pt  back and call was forwarded left another vm to return my call

## 2016-08-11 NOTE — Telephone Encounter (Signed)
[facility]

## 2016-08-11 NOTE — Telephone Encounter (Signed)
Referrals received a call from the hospital stating that the patient had a bilateral lower extremity ultrasound ordered several days ago that was ordered as stat though the patient never was scheduled for this. It popped up and their work list. They wanted to know what to do with this. On review of the chart it appears that the patient left the office prior to the provider being able to discuss the plan with her. It appears that there was possibly some attempt to contact the patient by the CMA to discuss this though I'm unsure what the outcome of that was. Given that there was concerned enough to order this on Tuesday we will replace the order to be done today. They'll try to contact the patient and let her know that this needs to be done today. If she is unable to get it done today she will need to be reevaluated today for this issue. I will forward this to the referral coordinator to get this set up and to contact the patient.

## 2016-08-11 NOTE — Telephone Encounter (Signed)
Lm for pt to get in contact with us about her stat vascular ultrasound will try again later..Marland Kitchen

## 2016-08-12 ENCOUNTER — Telehealth: Payer: Self-pay | Admitting: Family Medicine

## 2016-08-12 ENCOUNTER — Encounter: Payer: Self-pay | Admitting: Family Medicine

## 2016-08-12 NOTE — Telephone Encounter (Signed)
Attempted to call the patient twice this morning. First one at 8:12 am and then again at 9:10. Had to lvm both times on both numbers that is listed in her chart.  Mailing letter to pt to please contact us.

## 2016-08-12 NOTE — Telephone Encounter (Signed)
Noted. This is in regards to scheduling a bilateral LE venous US.

## 2016-08-18 ENCOUNTER — Ambulatory Visit: Payer: Self-pay | Admitting: Family Medicine

## 2016-09-02 ENCOUNTER — Ambulatory Visit: Payer: Self-pay | Admitting: Family Medicine

## 2016-09-02 DIAGNOSIS — Z0289 Encounter for other administrative examinations: Secondary | ICD-10-CM

## 2016-09-05 ENCOUNTER — Encounter: Payer: Self-pay | Admitting: Family Medicine

## 2016-09-13 ENCOUNTER — Encounter: Payer: Self-pay | Admitting: Podiatry

## 2016-09-13 ENCOUNTER — Ambulatory Visit (INDEPENDENT_AMBULATORY_CARE_PROVIDER_SITE_OTHER): Payer: Medicare Other | Admitting: Podiatry

## 2016-09-13 ENCOUNTER — Ambulatory Visit (INDEPENDENT_AMBULATORY_CARE_PROVIDER_SITE_OTHER): Payer: Medicare Other

## 2016-09-13 VITALS — BP 144/98 | HR 76

## 2016-09-13 DIAGNOSIS — M25571 Pain in right ankle and joints of right foot: Secondary | ICD-10-CM | POA: Diagnosis not present

## 2016-09-13 DIAGNOSIS — M659 Synovitis and tenosynovitis, unspecified: Secondary | ICD-10-CM | POA: Diagnosis not present

## 2016-09-13 DIAGNOSIS — R52 Pain, unspecified: Secondary | ICD-10-CM | POA: Diagnosis not present

## 2016-09-13 DIAGNOSIS — M7751 Other enthesopathy of right foot: Secondary | ICD-10-CM

## 2016-09-13 MED ORDER — METHYLPREDNISOLONE 4 MG PO TBPK: ORAL_TABLET | ORAL | 0 refills | Status: DC

## 2016-09-13 MED ORDER — MELOXICAM 15 MG PO TABS: 15.0000 mg | ORAL_TABLET | Freq: Every day | ORAL | 1 refills | Status: AC

## 2016-09-15 DIAGNOSIS — Z23 Encounter for immunization: Secondary | ICD-10-CM | POA: Diagnosis not present

## 2016-09-18 MED ORDER — BETAMETHASONE SOD PHOS & ACET 6 (3-3) MG/ML IJ SUSP: 3.0000 mg | Freq: Once | INTRAMUSCULAR | Status: DC

## 2016-09-18 NOTE — Progress Notes (Signed)
Subjective:  Patient presents today for pain and tenderness to the right ankle. Patient relates significant pain and tenderness when walking. Patient states that she stepped on a stair wrong approximately 3 months ago. She has experienced foot and ankle pain ever since. Patient presents for further treatment and evaluation.    Objective / Physical Exam:  General:  The patient is alert and oriented x3 in no acute distress. Dermatology:  Skin is warm, dry and supple bilateral lower extremities. Negative for open lesions or macerations. Vascular:  Palpable pedal pulses bilaterally. No edema or erythema noted. Capillary refill within normal limits. Neurological:  Epicritic and protective threshold grossly intact bilaterally.  Musculoskeletal Exam:  Pain on palpation to the anterior lateral medial aspects of the patient's left ankle. Mild edema noted.  Range of motion within normal limits to all pedal and ankle joints bilateral. Muscle strength 5/5 in all groups bilateral.   Radiographic Exam:  Normal osseous mineralization. Joint spaces preserved. No fracture/dislocation/boney destruction.    Assessment: #1 pain in right ankle #2 synovitis of right ankle #3 capsulitis of right ankle   Plan of Care:  #1 Patient was evaluated. #2 injection of 0.5 mL Celestone Soluspan injected in the patient's right ankle. #3 prescription for Medrol Dosepak, meloxicam  #4 cam boot dispensed today #5 compression anklet dispensed today #6 patient is to return to clinic in 4 weeks   Dr. Felecia ShellingBrent M. Evans, DPM [facility]

## 2016-09-26 ENCOUNTER — Other Ambulatory Visit: Payer: Self-pay | Admitting: Family Medicine

## 2016-09-26 DIAGNOSIS — I10 Essential (primary) hypertension: Secondary | ICD-10-CM

## 2016-09-26 NOTE — Telephone Encounter (Signed)
Please call the patient and see why she takes this medication. Thanks.

## 2016-09-26 NOTE — Telephone Encounter (Signed)
Please advise on refill.

## 2016-09-27 ENCOUNTER — Ambulatory Visit (INDEPENDENT_AMBULATORY_CARE_PROVIDER_SITE_OTHER): Payer: Medicare Other | Admitting: Family Medicine

## 2016-09-27 ENCOUNTER — Encounter: Payer: Self-pay | Admitting: Family Medicine

## 2016-09-27 VITALS — BP 110/68 | HR 73 | Temp 97.8°F | Wt 228.0 lb

## 2016-09-27 DIAGNOSIS — R600 Localized edema: Secondary | ICD-10-CM

## 2016-09-27 DIAGNOSIS — R002 Palpitations: Secondary | ICD-10-CM

## 2016-09-27 DIAGNOSIS — G894 Chronic pain syndrome: Secondary | ICD-10-CM

## 2016-09-27 DIAGNOSIS — J449 Chronic obstructive pulmonary disease, unspecified: Secondary | ICD-10-CM | POA: Diagnosis not present

## 2016-09-27 DIAGNOSIS — K219 Gastro-esophageal reflux disease without esophagitis: Secondary | ICD-10-CM

## 2016-09-27 DIAGNOSIS — J441 Chronic obstructive pulmonary disease with (acute) exacerbation: Secondary | ICD-10-CM | POA: Diagnosis not present

## 2016-09-27 LAB — TSH: TSH: 2.26 u[IU]/mL (ref 0.35–4.50)

## 2016-09-27 LAB — CBC
HCT: 43.7 % (ref 36.0–46.0)
Hemoglobin: 14.7 g/dL (ref 12.0–15.0)
MCHC: 33.6 g/dL (ref 30.0–36.0)
MCV: 90.1 fl (ref 78.0–100.0)
PLATELETS: 316 10*3/uL (ref 150.0–400.0)
RBC: 4.85 Mil/uL (ref 3.87–5.11)
RDW: 13.4 % (ref 11.5–15.5)
WBC: 7.7 10*3/uL (ref 4.0–10.5)

## 2016-09-27 LAB — COMPREHENSIVE METABOLIC PANEL
ALT: 75 U/L — AB (ref 0–35)
AST: 26 U/L (ref 0–37)
Albumin: 4.5 g/dL (ref 3.5–5.2)
Alkaline Phosphatase: 51 U/L (ref 39–117)
BILIRUBIN TOTAL: 0.5 mg/dL (ref 0.2–1.2)
BUN: 8 mg/dL (ref 6–23)
CALCIUM: 9.8 mg/dL (ref 8.4–10.5)
CHLORIDE: 103 meq/L (ref 96–112)
CO2: 28 meq/L (ref 19–32)
Creatinine, Ser: 1 mg/dL (ref 0.40–1.20)
GFR: 61.6 mL/min (ref >60.00)
Glucose, Bld: 104 mg/dL — ABNORMAL HIGH (ref 70–99)
Potassium: 4.6 mEq/L (ref 3.5–5.1)
Sodium: 139 mEq/L (ref 135–145)
Total Protein: 7.3 g/dL (ref 6.0–8.3)

## 2016-09-27 MED ORDER — ESOMEPRAZOLE MAGNESIUM 20 MG PO CPDR: 20.0000 mg | DELAYED_RELEASE_CAPSULE | Freq: Two times a day (BID) | ORAL | 2 refills | Status: DC

## 2016-09-27 MED ORDER — TRAMADOL HCL 50 MG PO TABS: 50.0000 mg | ORAL_TABLET | Freq: Three times a day (TID) | ORAL | 0 refills | Status: DC | PRN

## 2016-09-27 MED ORDER — BUDESONIDE-FORMOTEROL FUMARATE 160-4.5 MCG/ACT IN AERO: 2.0000 | INHALATION_SPRAY | Freq: Two times a day (BID) | RESPIRATORY_TRACT | 11 refills | Status: AC

## 2016-09-27 MED ORDER — ONDANSETRON HCL 4 MG PO TABS: 4.0000 mg | ORAL_TABLET | Freq: Three times a day (TID) | ORAL | 0 refills | Status: DC | PRN

## 2016-09-27 NOTE — Assessment & Plan Note (Signed)
Nexium refilled. Asymptomatic at this time. Consider GI referral in the future if continues to require PPI.

## 2016-09-27 NOTE — Progress Notes (Signed)
Tommi Rumps, MD Phone: [phone]  Rebecca Ramos is a 55 y.o. female who presents today for follow-up.  COPD: Patient notes this is from passive smoke exposure. Uses Symbicort daily. No cough or shortness of breath.   GERD: Patient takes Nexium daily. No blood in her stool. No abdominal pain. Feels her reflux is controlled. Does have some nausea with her anxiety and takes Zofran.  Chronic pain: Related to low back pain. Has been taking tramadol for this. Stopped taking oxycodone as it made her nervous. Notes bilateral low back discomfort. Notes occasionally her left calf will burn and she's been on gabapentin for this. She is unsure if that helps. She notes no numbness or weakness. Notes her back will catch. No saddle anesthesia or incontinence. She has gotten some shots in her foot related to bone spur and those have been beneficial.  Pedal edema: Patient notes intermittent bilateral foot swelling. She's been on Lasix for this since she had a PE per her report. She notes several months ago she stopped the Lasix and developed swelling in her bilateral feet. Since then she's been taking Lasix and has not had an issue. She notes no calf pain or swelling. She does note some orthopnea and PND though not often with the PND. Occasionally has a flutter that lasts for several seconds and then resolves. No persistent fluttering. No shortness of breath or chest pain. She reports an extensive history of cardiac disease in her family.  PMH: nonsmoker.   ROS see history of present illness  Objective  Physical Exam Vitals:   09/27/16 0831  BP: 110/68  Pulse: 73  Temp: 97.8 F (36.6 C)    BP Readings from Last 3 Encounters:  09/27/16 110/68  09/13/16 (!) 144/98  08/09/16 124/78   Wt Readings from Last 3 Encounters:  09/27/16 228 lb (103.4 kg)  08/09/16 230 lb 8 oz (104.6 kg)  08/05/16 200 lb (90.7 kg)    Physical Exam  Constitutional: She is well-developed, well-nourished, and in no  distress.  Cardiovascular: Normal rate, regular rhythm and normal heart sounds.   Pulmonary/Chest: Effort normal and breath sounds normal.  Abdominal: Soft. Bowel sounds are normal. She exhibits no distension. There is no tenderness. There is no rebound and no guarding.  Musculoskeletal: She exhibits no edema.  No midline spine tenderness, no midline spine step-off, no muscular back tenderness in the lumbar spine, no calf tenderness or swelling or cords, no pedal swelling noted  Neurological: She is alert. Gait normal.  5 out of 5 strength in bilateral quads, hamstrings, plantar flexion, and dorsiflexion, sensation to light touch intact in bilateral lower extremities  Skin: Skin is warm.     Assessment/Plan: Please see individual problem list.  COPD (chronic obstructive pulmonary disease) (HCC) Stable. Asymptomatic at this time. She will continue her Symbicort.  GERD (gastroesophageal reflux disease) Nexium refilled. Asymptomatic at this time. Consider GI referral in the future if continues to require PPI.  Pedal edema Patient with chronic intermittent pedal edema. She declines any calf symptoms or swelling or pain when last seen in the office. Wells score 0. Unlikely that her swelling was related to DVT particularly given resolution with Lasix and bilateral nature. Of greater concern is her orthopnea and PND with occasional fluttering. I advised obtaining a EKG to evaluate though she declined this. We will refer to cardiology for further evaluation of these symptoms. She is given return precautions.  Chronic pain syndrome Patient no longer on oxycodone. Currently taking tramadol  for pain. Refill will be provided. She'll monitor her symptoms. She's given return precautions.   Orders Placed This Encounter  Procedures  . Comp Met (CMET)  . CBC  . TSH  . Ambulatory referral to Cardiology    Referral Priority:   Routine    Referral Type:   Consultation    Referral Reason:   Specialty  Services Required    Requested Specialty:   Cardiology    Number of Visits Requested:   1    Meds ordered this encounter  Medications  . traMADol (ULTRAM) 50 MG tablet    Sig: Take 1 tablet (50 mg total) by mouth every 8 (eight) hours as needed for moderate pain or severe pain.    Dispense:  60 tablet    Refill:  0  . budesonide-formoterol (SYMBICORT) 160-4.5 MCG/ACT inhaler    Sig: Inhale 2 puffs into the lungs 2 (two) times daily.    Dispense:  1 Inhaler    Refill:  11  . esomeprazole (NEXIUM) 20 MG capsule    Sig: Take 1 capsule (20 mg total) by mouth 2 (two) times daily before a meal.    Dispense:  180 capsule    Refill:  2  . ondansetron (ZOFRAN) 4 MG tablet    Sig: Take 1 tablet (4 mg total) by mouth every 8 (eight) hours as needed for nausea or vomiting.    Dispense:  20 tablet    Refill:  0    Tommi Rumps, MD [place]

## 2016-09-27 NOTE — Assessment & Plan Note (Signed)
Patient no longer on oxycodone. Currently taking tramadol for pain. Refill will be provided. She'll monitor her symptoms. She's given return precautions.

## 2016-09-27 NOTE — Patient Instructions (Signed)
Nice to see you. We will refer you to cardiology. We are going to refill your medicines. If you develop chest pain, shortness of breath, palpitations, or any new or changing symptoms please seek medical attention immediately.

## 2016-09-27 NOTE — Progress Notes (Signed)
Pre visit review using our clinic review tool, if applicable. No additional management support is needed unless otherwise documented below in the visit note. 

## 2016-09-27 NOTE — Assessment & Plan Note (Signed)
Stable. Asymptomatic at this time. She will continue her Symbicort.

## 2016-09-27 NOTE — Assessment & Plan Note (Signed)
Patient with chronic intermittent pedal edema. She declines any calf symptoms or swelling or pain when last seen in the office. Wells score 0. Unlikely that her swelling was related to DVT particularly given resolution with Lasix and bilateral nature. Of greater concern is her orthopnea and PND with occasional fluttering. I advised obtaining a EKG to evaluate though she declined this. We will refer to cardiology for further evaluation of these symptoms. She is given return precautions.

## 2016-09-29 ENCOUNTER — Ambulatory Visit: Payer: Self-pay | Admitting: Cardiology

## 2016-09-30 NOTE — Telephone Encounter (Signed)
Refill sent to pharmacy.   

## 2016-09-30 NOTE — Telephone Encounter (Signed)
You spoke with patient about this at last appointment. Do you want to refill

## 2016-10-03 ENCOUNTER — Other Ambulatory Visit: Payer: Self-pay | Admitting: Family Medicine

## 2016-10-03 DIAGNOSIS — R7309 Other abnormal glucose: Secondary | ICD-10-CM

## 2016-10-03 DIAGNOSIS — R7989 Other specified abnormal findings of blood chemistry: Secondary | ICD-10-CM

## 2016-10-03 DIAGNOSIS — R945 Abnormal results of liver function studies: Secondary | ICD-10-CM

## 2016-10-04 ENCOUNTER — Ambulatory Visit (INDEPENDENT_AMBULATORY_CARE_PROVIDER_SITE_OTHER): Payer: Medicare Other

## 2016-10-04 ENCOUNTER — Encounter: Payer: Self-pay | Admitting: Internal Medicine

## 2016-10-04 ENCOUNTER — Ambulatory Visit (INDEPENDENT_AMBULATORY_CARE_PROVIDER_SITE_OTHER): Payer: Medicare Other | Admitting: Internal Medicine

## 2016-10-04 VITALS — BP 140/82 | HR 69 | Ht 62.0 in | Wt 230.5 lb

## 2016-10-04 DIAGNOSIS — E782 Mixed hyperlipidemia: Secondary | ICD-10-CM

## 2016-10-04 DIAGNOSIS — R002 Palpitations: Secondary | ICD-10-CM | POA: Diagnosis not present

## 2016-10-04 DIAGNOSIS — R0789 Other chest pain: Secondary | ICD-10-CM | POA: Diagnosis not present

## 2016-10-04 DIAGNOSIS — I10 Essential (primary) hypertension: Secondary | ICD-10-CM

## 2016-10-04 DIAGNOSIS — R0602 Shortness of breath: Secondary | ICD-10-CM | POA: Diagnosis not present

## 2016-10-04 DIAGNOSIS — R011 Cardiac murmur, unspecified: Secondary | ICD-10-CM

## 2016-10-04 NOTE — Patient Instructions (Signed)
Medication Instructions:  Continue current medications.   Labwork: NONE  Testing/Procedures: Your physician has recommended that you wear an 14-DAY event monitor (ZIO PATCH). Event monitors are medical devices that record the heart's electrical activity. Doctors most often us these monitors to diagnose arrhythmias. Arrhythmias are problems with the speed or rhythm of the heartbeat. The monitor is a small, portable device. You can wear one while you do your normal daily activities. This is usually used to diagnose what is causing palpitations/syncope (passing out).   Follow-Up: Your physician recommends that you schedule a follow-up appointment in: 6 WEEKS WITH DR END.  If you need a refill on your cardiac medications before your next appointment, please call your pharmacy.

## 2016-10-04 NOTE — Progress Notes (Signed)
New Outpatient Visit Date: 10/04/2016  Referring Provider: Glori LuisEric G Sonnenberg, MD [address] [place], [place] [address]  Chief Complaint: Palpitations  HPI:  [name] is a 55 y.o. year-old female with history of hypertension, hyperlipidemia, heart murmur, COPD, GERD, and pulmonary embolism, who has been referred by Dr. Birdie SonsSonnenberg for evaluation of palpitations.  The patient reports intermittent flutters in her chest over the last year, which have become more frequent.  She describes several rapid heart beats in succession, that resolve when she clears her throat.  The episodes typically last only a few seconds at a time and occur about 4-5 times/week.  They seem to be most pronounced when she is under stress or lying in bed at night.  [name] notes accompanying chest tightness, with the feeling of not being able to catch her breath.  She denies lightheadedness and syncope.  The patient underwent echocardiogram and myocardial perfusion stress test this summer for evaluation of these symptoms (see details below).  She has never worn a heart monitor.  The patient is concerned due to a family history of multiple cardiac problems, including her mother having a pacemaker and her sister undergoing "heart surgery" twice (patient does not know what the indication was).  The patient reports fluctuating lower extremity edema, for which she has been taking furosemide.  She denies orthopnea but has experienced periodic PND.  She reports undergoing sleep study ~1 year ago that was normal.  She currently consumes 1-2 cups of coffee/ day and 1-2 cans of soda/day (caffeinated).  The patient notes that she was recently diagnosed with elevated blood sugar and liver tests by her PCP.  She is not currently on therapy for diabetes; she remains on atorvastatin for her history of  hyperlipidemia.  --------------------------------------------------------------------------------------------------  Cardiovascular History & Procedures: Cardiovascular Problems:  Palpitations  Risk Factors:  Hypertension, hyperlipidemia, diabetes mellitus, and ? Family history  Cath/PCI:  None  CV Surgery:  None  EP Procedures and Devices:  None  Non-Invasive Evaluation(s):  Transthoracic echocardiogram (06/07/16): Normal LV size and contraction (EF 60-65%) with mild LVH and grade 1 diastolic dysfunction.  Mild left atrial enlargement.  Normal RV size and function.  No significant valvular abnormalities.  Trivial pericardial effusion.  Pharmacologic myocardial perfusion stress test (06/07/16): No significant ischemia.  LVEF 44% (depressed EF likely secondary to GI uptake artifact).  Low risk study.  Recent CV Pertinent Labs: Lab Results  Component Value Date   CHOL 231 (H) 06/06/2016   HDL 43 06/06/2016   LDLCALC 142 (H) 06/06/2016   TRIG 231 (H) 06/06/2016   CHOLHDL 5.4 06/06/2016   INR 1.0 09/19/2014   BNP 9.0 06/06/2016   K 4.6 09/27/2016   K 3.6 10/04/2014   MG 2.1 09/20/2014   BUN 8 09/27/2016   BUN 11 10/04/2014   CREATININE 1.00 09/27/2016   CREATININE 0.94 10/04/2014   --------------------------------------------------------------------------------------------------  Past Medical History:  Diagnosis Date  . Acid reflux   . Anxiety   . Chronic lower back pain    osteoarthritis in luber spine  . COPD (chronic obstructive pulmonary disease) (HCC)   . Depression    Bipolar  . Heart murmur   . History of degenerative disc disease   . History of pulmonary embolus (PE)   . HTN (hypertension) 03/20/2015  . Hypertension   . Osteoarthritis of lumbosacral spine   . Right Achilles tendinitis   . Thrombus    right lung in 2015, off anticoagulation now  .  Transaminitis     Past Surgical History:  Procedure Laterality Date  . ABDOMINAL HYSTERECTOMY     . CESAREAN SECTION Bilateral   . CESAREAN SECTION Bilateral   . ELBOW SURGERY     bilateral  . thumb surgery     right   . TONSILLECTOMY      Outpatient Encounter Prescriptions as of 10/04/2016  Medication Sig  . albuterol (PROVENTIL HFA;VENTOLIN HFA) 108 (90 Base) MCG/ACT inhaler Inhale 1 puff into the lungs every 4 (four) hours.  . ALPRAZolam (XANAX) 1 MG tablet   . amLODipine (NORVASC) 5 MG tablet TAKE 1 TABLET BY MOUTH EVERY DAY  . atorvastatin (LIPITOR) 20 MG tablet Take 1 tablet (20 mg total) by mouth daily.  . Blood Pressure Monitoring (BLOOD PRESSURE CUFF) MISC 1 each by Does not apply route daily.  . budesonide-formoterol (SYMBICORT) 160-4.5 MCG/ACT inhaler Inhale 2 puffs into the lungs 2 (two) times daily.  . clindamycin (CLEOCIN T) 1 % external solution Apply topically 2 (two) times daily.  . COMBIVENT RESPIMAT 20-100 MCG/ACT AERS respimat Inhale 1 puff into the lungs 4 (four) times daily as needed. For wheezing.  . cyanocobalamin (,VITAMIN B-12,) 1000 MCG/ML injection Inject into the muscle.  . dicyclomine (BENTYL) 20 MG tablet TAKE 1 TABLET BY MOUTH THREE TIMES DAILY AS NEEDED FOR CRAMPY ABDOMINAL PAIN  . erythromycin with ethanol (EMGEL) 2 % gel Apply topically daily. Apply topically twice daily to facial rash as needed  . esomeprazole (NEXIUM) 20 MG capsule Take 1 capsule (20 mg total) by mouth 2 (two) times daily before a meal.  . fluticasone (FLONASE) 50 MCG/ACT nasal spray Place 2 sprays into both nostrils daily as needed for rhinitis.   . furosemide (LASIX) 20 MG tablet TAKE 1 TABLET(20 MG) BY MOUTH DAILY  . hydrOXYzine (VISTARIL) 50 MG capsule   . ipratropium-albuterol (DUONEB) 0.5-2.5 (3) MG/3ML SOLN Take 3 mLs by nebulization every 4 (four) hours as needed.  Marland Kitchen. lisinopril (PRINIVIL,ZESTRIL) 10 MG tablet Take 10 mg by mouth daily.  . meloxicam (MOBIC) 15 MG tablet Take 1 tablet (15 mg total) by mouth daily.  Marland Kitchen. omega-3 acid ethyl esters (LOVAZA) 1 g capsule   .  ondansetron (ZOFRAN) 4 MG tablet Take 1 tablet (4 mg total) by mouth every 8 (eight) hours as needed for nausea or vomiting.  . traMADol (ULTRAM) 50 MG tablet Take 1 tablet (50 mg total) by mouth every 8 (eight) hours as needed for moderate pain or severe pain.  . Vitamin D, Ergocalciferol, (DRISDOL) 50000 units CAPS capsule Take 50,000 Units by mouth once a week.  . zolpidem (AMBIEN) 10 MG tablet   . [DISCONTINUED] methylPREDNISolone (MEDROL DOSEPAK) 4 MG TBPK tablet 6 day dose pack - take as directed  . gabapentin (NEURONTIN) 300 MG capsule Take 300 mg by mouth 3 (three) times daily.  . hydrOXYzine (ATARAX/VISTARIL) 50 MG tablet Take 50 mg by mouth 4 (four) times daily as needed.   Facility-Administered Encounter Medications as of 10/04/2016  Medication  . betamethasone acetate-betamethasone sodium phosphate (CELESTONE) injection 3 mg  . guaiFENesin (MUCINEX) 12 hr tablet 600 mg    Allergies: Honey bee venom; Sulfacetamide sodium; Levofloxacin; Sulfa antibiotics; and Codeine  Social History   Social History  . Marital status: Single    Spouse name: N/A  . Number of children: N/A  . Years of education: N/A   Occupational History  . Not on file.   Social History Main Topics  . Smoking status: Never Smoker  .  Smokeless tobacco: Never Used  . Alcohol use No  . Drug use: No     Comment: Patient denies, but per previous H&P from Psych- cocaine use in the past  . Sexual activity: Yes    Birth control/ protection: Surgical   Other Topics Concern  . Not on file   Social History Narrative  . No narrative on file    Family History  Problem Relation Age of Onset  . CAD Mother   . Heart disease Mother     Pacemaker  . CAD Sister   . Heart disease Sister     Heart surgery x 1  . Throat cancer Brother   . CAD Maternal Grandmother   . CAD Maternal Grandfather     Review of Systems: A 12-system review of systems was performed and was negative except as noted in the  HPI.  --------------------------------------------------------------------------------------------------  Physical Exam: BP 140/82 (BP Location: Right Arm, Patient Position: Sitting, Cuff Size: Normal)   Pulse 69   Ht 5\' 2"  (1.575 m)   Wt 230 lb 8 oz (104.6 kg)   BMI 42.16 kg/m   General:  Morbidly obese woman, seated comfortably on the exam table. HEENT: No conjunctival pallor or scleral icterus.  Moist mucous membranes.  OP clear.  Poor dentition. Neck: Supple without lymphadenopathy, thyromegaly, JVD, or HJR.  No carotid bruit. Lungs: Normal work of breathing.  Clear to auscultation bilaterally without wheezes or crackles. Heart: Regular rate and rhythm without murmurs, rubs, or gallops.  Non-displaced PMI. Abd: Bowel sounds present.  Soft, NT/ND.  Unable to assess hepatosplenomegaly due to body habitus. Ext: Trace pretibial edema bilaterally.  Radial, PT, and DP pulses are 2+ bilaterally Skin: warm and dry without rash Neuro: CNIII-XII intact.  Strength and fine-touch sensation intact in upper and lower extremities bilaterally. Psych: Normal mood and affect.  EKG:  Normal sinus rhythm, borderline LVH, and left anterior fascicular block. No significant change from prior tracing on 05/22/16 (I have personally reviewed both tracings).  Lab Results  Component Value Date   WBC 7.7 09/27/2016   HGB 14.7 09/27/2016   HCT 43.7 09/27/2016   MCV 90.1 09/27/2016   PLT 316.0 09/27/2016    Lab Results  Component Value Date   NA 139 09/27/2016   K 4.6 09/27/2016   CL 103 09/27/2016   CO2 28 09/27/2016   BUN 8 09/27/2016   CREATININE 1.00 09/27/2016   GLUCOSE 104 (H) 09/27/2016   ALT 75 (H) 09/27/2016    Lab Results  Component Value Date   CHOL 231 (H) 06/06/2016   HDL 43 06/06/2016   LDLCALC 142 (H) 06/06/2016   TRIG 231 (H) 06/06/2016   CHOLHDL 5.4 06/06/2016    --------------------------------------------------------------------------------------------------  ASSESSMENT  AND PLAN: Palpitations: Symptoms have been present for at least 1 year but seem to be increasing in frequency.  She has brief accompanying chest tightness, though recent stress test and echo were without significant abnormalities.  We have agreed to obtain a 14-day Zio Patch even monitor to further characterize potential arrhythmias leading to her palpitations.  We will not make any medication changes today.  Hypertension: Blood pressure is borderline elevated today.  We will not make any medication changes at this time.  Patient should continue to follow with her PCP for ongoing management.  Hyperlipidemia: The patient was recently found to have mild transaminitis (ALT 75) in the setting of statin use.  Her most recent LDL in 05/2016 was elevated at 142.  We  have not made any medication adjustments today; I think it is reasonable to continue atorvastatin with only a mild elevation in ALT.  I will defer further workup of the patient's transaminitis to Dr. Birdie Sons.  Follow-up: Return to clinic in 6 weeks.  Yvonne Kendall, MD 10/04/2016 12:09 PM

## 2016-10-14 ENCOUNTER — Ambulatory Visit: Payer: Medicare Other | Admitting: Podiatry

## 2016-10-17 ENCOUNTER — Other Ambulatory Visit (INDEPENDENT_AMBULATORY_CARE_PROVIDER_SITE_OTHER): Payer: Commercial Managed Care - HMO

## 2016-10-17 ENCOUNTER — Telehealth: Payer: Self-pay | Admitting: Family Medicine

## 2016-10-17 DIAGNOSIS — R7989 Other specified abnormal findings of blood chemistry: Secondary | ICD-10-CM

## 2016-10-17 DIAGNOSIS — R7309 Other abnormal glucose: Secondary | ICD-10-CM

## 2016-10-17 DIAGNOSIS — R945 Abnormal results of liver function studies: Principal | ICD-10-CM

## 2016-10-17 LAB — COMPREHENSIVE METABOLIC PANEL
ALK PHOS: 57 U/L (ref 39–117)
ALT: 47 U/L — AB (ref 0–35)
AST: 18 U/L (ref 0–37)
Albumin: 4.4 g/dL (ref 3.5–5.2)
BILIRUBIN TOTAL: 0.3 mg/dL (ref 0.2–1.2)
BUN: 8 mg/dL (ref 6–23)
CO2: 28 meq/L (ref 19–32)
CREATININE: 1.01 mg/dL (ref 0.40–1.20)
Calcium: 9.6 mg/dL (ref 8.4–10.5)
Chloride: 104 mEq/L (ref 96–112)
GFR: 60.89 mL/min (ref >60.00)
GLUCOSE: 87 mg/dL (ref 70–99)
Potassium: 4.3 mEq/L (ref 3.5–5.1)
SODIUM: 141 meq/L (ref 135–145)
TOTAL PROTEIN: 6.8 g/dL (ref 6.0–8.3)

## 2016-10-17 LAB — HEMOGLOBIN A1C: Hgb A1c MFr Bld: 5.4 % (ref 4.6–6.5)

## 2016-10-17 MED ORDER — TRAMADOL HCL 50 MG PO TABS: 50.0000 mg | ORAL_TABLET | Freq: Three times a day (TID) | ORAL | 0 refills | Status: DC | PRN

## 2016-10-17 NOTE — Telephone Encounter (Signed)
RX faxed

## 2016-10-17 NOTE — Telephone Encounter (Signed)
Pt requesting medication refill on traMADol (ULTRAM) 50 MG tablet. Thank you!  Pharmacy - [phone] [place] [address] [facility] [address] [facility]  Call pt @ [phone]

## 2016-10-18 ENCOUNTER — Ambulatory Visit: Payer: Medicare Other | Admitting: Podiatry

## 2016-10-19 ENCOUNTER — Telehealth: Payer: Self-pay | Admitting: *Deleted

## 2016-10-19 NOTE — Telephone Encounter (Signed)
Pt requested lab results  Pt contact [phone]

## 2016-10-19 NOTE — Telephone Encounter (Signed)
Pt called back for lab results. Please advise pt

## 2016-10-20 ENCOUNTER — Ambulatory Visit
Admission: RE | Admit: 2016-10-20 | Discharge: 2016-10-20 | Disposition: A | Discharge status: Home / Self Care | Payer: Commercial Managed Care - HMO | Source: Ambulatory Visit | Attending: Family Medicine | Admitting: Family Medicine

## 2016-10-20 ENCOUNTER — Encounter: Payer: Self-pay | Admitting: Family Medicine

## 2016-10-20 ENCOUNTER — Ambulatory Visit (INDEPENDENT_AMBULATORY_CARE_PROVIDER_SITE_OTHER): Payer: Commercial Managed Care - HMO | Admitting: Family Medicine

## 2016-10-20 ENCOUNTER — Telehealth: Payer: Self-pay

## 2016-10-20 VITALS — BP 110/72 | HR 65 | Temp 98.2°F | Wt 230.0 lb

## 2016-10-20 DIAGNOSIS — K760 Fatty (change of) liver, not elsewhere classified: Secondary | ICD-10-CM | POA: Insufficient documentation

## 2016-10-20 DIAGNOSIS — R1012 Left upper quadrant pain: Secondary | ICD-10-CM | POA: Diagnosis not present

## 2016-10-20 LAB — CBC
HEMATOCRIT: 40.8 % (ref 36.0–46.0)
Hemoglobin: 14 g/dL (ref 12.0–15.0)
MCHC: 34.4 g/dL (ref 30.0–36.0)
MCV: 88.7 fl (ref 78.0–100.0)
Platelets: 335 10*3/uL (ref 150.0–400.0)
RBC: 4.6 Mil/uL (ref 3.87–5.11)
RDW: 13.6 % (ref 11.5–15.5)
WBC: 7.3 10*3/uL (ref 4.0–10.5)

## 2016-10-20 LAB — COMPREHENSIVE METABOLIC PANEL
ALBUMIN: 4.5 g/dL (ref 3.5–5.2)
ALT: 71 U/L — ABNORMAL HIGH (ref 0–35)
AST: 36 U/L (ref 0–37)
Alkaline Phosphatase: 65 U/L (ref 39–117)
BUN: 6 mg/dL (ref 6–23)
CHLORIDE: 103 meq/L (ref 96–112)
CO2: 30 mEq/L (ref 19–32)
CREATININE: 0.95 mg/dL (ref 0.40–1.20)
Calcium: 9.5 mg/dL (ref 8.4–10.5)
GFR: 65.34 mL/min (ref >60.00)
Glucose, Bld: 101 mg/dL — ABNORMAL HIGH (ref 70–99)
Potassium: 4.5 mEq/L (ref 3.5–5.1)
SODIUM: 139 meq/L (ref 135–145)
Total Bilirubin: 0.5 mg/dL (ref 0.2–1.2)
Total Protein: 7.5 g/dL (ref 6.0–8.3)

## 2016-10-20 LAB — LIPASE: LIPASE: 82 U/L — AB (ref 11.0–59.0)

## 2016-10-20 MED ORDER — IOPAMIDOL (ISOVUE-370) INJECTION 76%
100.0000 mL | Freq: Once | INTRAVENOUS | Status: AC | PRN
  Administered 2016-10-20: 100 mL via INTRAVENOUS

## 2016-10-20 NOTE — Telephone Encounter (Signed)
CT Abd Pelvis Results Moderate stool burden in colon  see Full report in EPIC

## 2016-10-20 NOTE — Telephone Encounter (Signed)
Patient came in for appointment.  

## 2016-10-20 NOTE — Progress Notes (Signed)
Pre visit review using our clinic review tool, if applicable. No additional management support is needed unless otherwise documented below in the visit note. 

## 2016-10-20 NOTE — Progress Notes (Signed)
Tommi Rumps, MD Phone: [phone]  Rebecca Ramos is a 55 y.o. female who presents today for same-day visit.  Patient notes intermittent upper abdominal discomfort for the last 6 months. Notes she feels as though there is a swelling in her left upper quadrant at times.The pain can alternate left upper quadrant and right upper quadrant. Some nausea. No vomiting or diarrhea. Notes no bright red blood in her stool though she does note black stools at times. All this has been going on for 6 months. She did not mention this during our previous office visits. States she did not think to mention it. Notes she has bowel movements every 3-4 days. Does have a history of reflux. Takes Nexium and Zantac for this. Not on any blood thinners currently. Has a history of a hysterectomy for excessive bleeding. Also a history of 2 cesarean sections. Still has her gallbladder and appendix. No chest pain with any of this. No prior EGD or colonoscopy. Has been taking tramadol and BC powders for her discomfort.  PMH: nonsmoker.   ROS see history of present illness  Objective  Physical Exam Vitals:   10/20/16 1055  BP: 110/72  Pulse: 65  Temp: 98.2 F (36.8 C)    BP Readings from Last 3 Encounters:  10/20/16 110/72  10/04/16 140/82  09/27/16 110/68   Wt Readings from Last 3 Encounters:  10/20/16 230 lb (104.3 kg)  10/04/16 230 lb 8 oz (104.6 kg)  09/27/16 228 lb (103.4 kg)    Physical Exam  Constitutional: No distress.  Cardiovascular: Normal rate and normal heart sounds.   Pulmonary/Chest: Effort normal and breath sounds normal.  Abdominal: Soft. Bowel sounds are normal. She exhibits no distension. There is tenderness (left upper quadrant). There is guarding (focally in the left upper quadrant). There is no rebound.  Genitourinary: Rectal exam shows guaiac negative stool.  Genitourinary Comments: Normal rectum, brown stool noted on glove from rectal exam  Musculoskeletal: She exhibits no  edema.  Neurological: She is alert. Gait normal.  Skin: Skin is warm and dry. She is not diaphoretic.     Assessment/Plan: Please see individual problem list.  Abdominal pain, left upper quadrant Patient with 6 months of intermittent left upper quadrant abdominal discomfort. Has tenderness on exam today with some guarding over the area of tenderness. Otherwise the rest of her abdominal exam is normal. Does note some black stools over this time as well. Guaiac negative today. Stable vital signs. Differential is wide for this issue. Will obtain stat CT scan to evaluate further. We will obtain lab work as outlined below. She'll complete stool cards at home. She is given return precautions. We will contact her with the results of her labs and CT scan.    Orders Placed This Encounter  Procedures  . CT Abdomen Pelvis W Contrast    Standing Status:   Future    Standing Expiration Date:   01/18/2018    Order Specific Question:   If indicated for the ordered procedure, I authorize the administration of contrast media per Radiology protocol    Answer:   Yes    Order Specific Question:   Reason for Exam (SYMPTOM  OR DIAGNOSIS REQUIRED)    Answer:   left upper quadrant abdominal pain with TTP and focal guarding, no rebound    Order Specific Question:   Is patient pregnant?    Answer:   No    Order Specific Question:   Preferred imaging location?    Answer:  [facility]    Order Specific Question:   Call Results- Best Contact Number?    Answer:   [phone] DR Lacinda Axon, do not need to hold  . Comp Met (CMET)  . CBC  . Lipase    Tommi Rumps, MD [place]

## 2016-10-20 NOTE — Assessment & Plan Note (Signed)
Patient with 6 months of intermittent left upper quadrant abdominal discomfort. Has tenderness on exam today with some guarding over the area of tenderness. Otherwise the rest of her abdominal exam is normal. Does note some black stools over this time as well. Guaiac negative today. Stable vital signs. Differential is wide for this issue. Will obtain stat CT scan to evaluate further. We will obtain lab work as outlined below. She'll complete stool cards at home. She is given return precautions. We will contact her with the results of her labs and CT scan.

## 2016-10-20 NOTE — Patient Instructions (Signed)
Nice to see you. We're going to obtain a CT scan to evaluate for a cause of your pain. We're also going to obtain lab work. If you develop worsening pain, blood in your stool, nausea, vomiting, or any new or changing symptoms please seek medical attention immediately.

## 2016-10-20 NOTE — Telephone Encounter (Signed)
Noted. See lab work result in patients chart.

## 2016-10-21 ENCOUNTER — Other Ambulatory Visit: Payer: Self-pay

## 2016-10-21 ENCOUNTER — Other Ambulatory Visit: Payer: Self-pay | Admitting: Family Medicine

## 2016-10-21 ENCOUNTER — Telehealth: Payer: Self-pay

## 2016-10-21 ENCOUNTER — Other Ambulatory Visit (INDEPENDENT_AMBULATORY_CARE_PROVIDER_SITE_OTHER): Payer: Commercial Managed Care - HMO

## 2016-10-21 ENCOUNTER — Telehealth: Payer: Self-pay | Admitting: Family Medicine

## 2016-10-21 DIAGNOSIS — R748 Abnormal levels of other serum enzymes: Secondary | ICD-10-CM

## 2016-10-21 LAB — LIPASE: Lipase: 21 U/L (ref 11.0–59.0)

## 2016-10-21 MED ORDER — POLYETHYLENE GLYCOL 3350 17 GM/SCOOP PO POWD: 17.0000 g | Freq: Every day | ORAL | 1 refills | Status: AC

## 2016-10-21 NOTE — Telephone Encounter (Signed)
Order placed. Will forward to jamie to discuss result with patient. See result note.

## 2016-10-21 NOTE — Telephone Encounter (Signed)
Ashleigh took care of this message

## 2016-10-21 NOTE — Telephone Encounter (Signed)
Pt coming for repeat liver function tests today. Please place future order. Thank you.

## 2016-10-21 NOTE — Telephone Encounter (Signed)
Pt came in for labs (still no orders) & pt asked for a call today regarding her CT result.

## 2016-10-21 NOTE — Progress Notes (Signed)
Pt came in for labs & would like a call back to discuss CT results. I will place lab orders because I need to send them with the last pick up.

## 2016-10-21 NOTE — Telephone Encounter (Signed)
Scheduled patient for follow up to discuss CT

## 2016-10-21 NOTE — Telephone Encounter (Signed)
Pt called requesting CT results. Thank you!  Call pt @ [phone]

## 2016-10-24 ENCOUNTER — Other Ambulatory Visit: Payer: Self-pay

## 2016-10-25 ENCOUNTER — Ambulatory Visit: Payer: Self-pay | Admitting: Family Medicine

## 2016-10-25 DIAGNOSIS — Z0289 Encounter for other administrative examinations: Secondary | ICD-10-CM

## 2016-10-26 DIAGNOSIS — R002 Palpitations: Secondary | ICD-10-CM | POA: Diagnosis not present

## 2016-10-31 ENCOUNTER — Telehealth: Payer: Self-pay | Admitting: *Deleted

## 2016-10-31 NOTE — Telephone Encounter (Signed)
Patient had a CT scan done previously though I believe she was informed of these results. Please see if this is what she is requesting. Thanks.

## 2016-10-31 NOTE — Telephone Encounter (Signed)
Patient requested Xray results if available  Pt contact [phone]

## 2016-10-31 NOTE — Telephone Encounter (Signed)
Please advise, I do not see where xray was done

## 2016-10-31 NOTE — Telephone Encounter (Signed)
Left message to return call 

## 2016-11-01 ENCOUNTER — Telehealth: Payer: Self-pay | Admitting: Internal Medicine

## 2016-11-01 MED ORDER — METOPROLOL TARTRATE 25 MG PO TABS: 12.5000 mg | ORAL_TABLET | Freq: Two times a day (BID) | ORAL | 5 refills | Status: DC

## 2016-11-01 NOTE — Telephone Encounter (Signed)
Patient just wanted to make sure we were aware of changes from cardiology. Patient has a follow up visit with Dr.Sonnenburg on 11/09/16.

## 2016-11-01 NOTE — Telephone Encounter (Signed)
I spoke with patient regarding the results of her 14 day event monitor. This revealed rare ventricular and supraventricular ectopy as well as a single 4 beat run of SVT. Patient continues to have intermittent palpitations several days a week, which are distressing to her. We have discussed treatment options and have agreed to start metoprolol tartrate 12.5 mg twice a day. I also advised the patient to limit her caffeine intake as much as possible. We will follow-up as previously scheduled on 11/18/16 to see how she is responding to this therapy.  Yvonne Kendallhristopher Aikeem Lilley, MD [facility] Pager: [phone]

## 2016-11-03 ENCOUNTER — Other Ambulatory Visit: Payer: Self-pay | Admitting: Family Medicine

## 2016-11-03 NOTE — Telephone Encounter (Signed)
Last filled 10/17/16 60 0rf, patient is scheduled for follow up 11/09/16

## 2016-11-04 NOTE — Telephone Encounter (Signed)
It is okay to refill this medication. Please call it into the patient's pharmacy as tramadol 50 mg tablet with the directions "take 1 tablet by mouth every 8 hours as needed for moderate or severe pain." Dispense 60 tablets with 0 refills. I signed a new order for this.. Thanks.

## 2016-11-04 NOTE — Telephone Encounter (Signed)
Called rx to pharmacy

## 2016-11-09 ENCOUNTER — Encounter: Payer: Self-pay | Admitting: Emergency Medicine

## 2016-11-09 ENCOUNTER — Ambulatory Visit (INDEPENDENT_AMBULATORY_CARE_PROVIDER_SITE_OTHER): Payer: Commercial Managed Care - HMO | Admitting: Family Medicine

## 2016-11-09 ENCOUNTER — Emergency Department: Payer: Commercial Managed Care - HMO

## 2016-11-09 ENCOUNTER — Telehealth: Payer: Self-pay | Admitting: Internal Medicine

## 2016-11-09 ENCOUNTER — Observation Stay
Admission: EM | Admit: 2016-11-09 | Discharge: 2016-11-10 | Disposition: A | Discharge status: Home / Self Care | Payer: Commercial Managed Care - HMO | Attending: Internal Medicine | Admitting: Internal Medicine

## 2016-11-09 ENCOUNTER — Encounter: Payer: Self-pay | Admitting: Family Medicine

## 2016-11-09 VITALS — BP 130/94 | HR 97 | Temp 98.0°F | Wt 228.0 lb

## 2016-11-09 DIAGNOSIS — Z91030 Bee allergy status: Secondary | ICD-10-CM | POA: Diagnosis not present

## 2016-11-09 DIAGNOSIS — E785 Hyperlipidemia, unspecified: Secondary | ICD-10-CM | POA: Diagnosis not present

## 2016-11-09 DIAGNOSIS — R51 Headache: Secondary | ICD-10-CM | POA: Diagnosis not present

## 2016-11-09 DIAGNOSIS — R1012 Left upper quadrant pain: Secondary | ICD-10-CM

## 2016-11-09 DIAGNOSIS — Z6841 Body Mass Index (BMI) 40.0 and over, adult: Secondary | ICD-10-CM | POA: Diagnosis not present

## 2016-11-09 DIAGNOSIS — F1420 Cocaine dependence, uncomplicated: Secondary | ICD-10-CM | POA: Insufficient documentation

## 2016-11-09 DIAGNOSIS — J449 Chronic obstructive pulmonary disease, unspecified: Secondary | ICD-10-CM | POA: Insufficient documentation

## 2016-11-09 DIAGNOSIS — F419 Anxiety disorder, unspecified: Secondary | ICD-10-CM | POA: Diagnosis not present

## 2016-11-09 DIAGNOSIS — R002 Palpitations: Secondary | ICD-10-CM | POA: Insufficient documentation

## 2016-11-09 DIAGNOSIS — R079 Chest pain, unspecified: Secondary | ICD-10-CM

## 2016-11-09 DIAGNOSIS — M47816 Spondylosis without myelopathy or radiculopathy, lumbar region: Secondary | ICD-10-CM | POA: Diagnosis not present

## 2016-11-09 DIAGNOSIS — R0789 Other chest pain: Secondary | ICD-10-CM | POA: Diagnosis not present

## 2016-11-09 DIAGNOSIS — R202 Paresthesia of skin: Secondary | ICD-10-CM

## 2016-11-09 DIAGNOSIS — Z86711 Personal history of pulmonary embolism: Secondary | ICD-10-CM | POA: Insufficient documentation

## 2016-11-09 DIAGNOSIS — F319 Bipolar disorder, unspecified: Secondary | ICD-10-CM | POA: Diagnosis not present

## 2016-11-09 DIAGNOSIS — Y92239 Unspecified place in hospital as the place of occurrence of the external cause: Secondary | ICD-10-CM | POA: Diagnosis not present

## 2016-11-09 DIAGNOSIS — K760 Fatty (change of) liver, not elsewhere classified: Secondary | ICD-10-CM | POA: Diagnosis not present

## 2016-11-09 DIAGNOSIS — I119 Hypertensive heart disease without heart failure: Secondary | ICD-10-CM | POA: Diagnosis not present

## 2016-11-09 DIAGNOSIS — I471 Supraventricular tachycardia: Secondary | ICD-10-CM | POA: Insufficient documentation

## 2016-11-09 DIAGNOSIS — G894 Chronic pain syndrome: Secondary | ICD-10-CM | POA: Diagnosis not present

## 2016-11-09 DIAGNOSIS — Z881 Allergy status to other antibiotic agents status: Secondary | ICD-10-CM | POA: Insufficient documentation

## 2016-11-09 DIAGNOSIS — E669 Obesity, unspecified: Secondary | ICD-10-CM | POA: Insufficient documentation

## 2016-11-09 DIAGNOSIS — T463X5A Adverse effect of coronary vasodilators, initial encounter: Secondary | ICD-10-CM | POA: Insufficient documentation

## 2016-11-09 DIAGNOSIS — Z882 Allergy status to sulfonamides status: Secondary | ICD-10-CM | POA: Insufficient documentation

## 2016-11-09 DIAGNOSIS — I10 Essential (primary) hypertension: Secondary | ICD-10-CM | POA: Diagnosis present

## 2016-11-09 DIAGNOSIS — Z885 Allergy status to narcotic agent status: Secondary | ICD-10-CM | POA: Diagnosis not present

## 2016-11-09 DIAGNOSIS — R519 Headache, unspecified: Secondary | ICD-10-CM

## 2016-11-09 DIAGNOSIS — K219 Gastro-esophageal reflux disease without esophagitis: Secondary | ICD-10-CM | POA: Insufficient documentation

## 2016-11-09 DIAGNOSIS — Z8249 Family history of ischemic heart disease and other diseases of the circulatory system: Secondary | ICD-10-CM | POA: Diagnosis not present

## 2016-11-09 LAB — TROPONIN I: Troponin I: <0.03 ng/mL (ref <0.03)

## 2016-11-09 LAB — CBC
HEMATOCRIT: 42.3 % (ref 35.0–47.0)
HEMOGLOBIN: 14.9 g/dL (ref 12.0–16.0)
MCH: 31.1 pg (ref 26.0–34.0)
MCHC: 35.1 g/dL (ref 32.0–36.0)
MCV: 88.6 fL (ref 80.0–100.0)
Platelets: 316 10*3/uL (ref 150–440)
RBC: 4.78 MIL/uL (ref 3.80–5.20)
RDW: 13.8 % (ref 11.5–14.5)
WBC: 6.7 10*3/uL (ref 3.6–11.0)

## 2016-11-09 LAB — URINE DRUG SCREEN, QUALITATIVE (ARMC ONLY)
Amphetamines, Ur Screen: NOT DETECTED
BARBITURATES, UR SCREEN: NOT DETECTED
Benzodiazepine, Ur Scrn: POSITIVE — AB
COCAINE METABOLITE, UR ~~LOC~~: NOT DETECTED
Cannabinoid 50 Ng, Ur ~~LOC~~: NOT DETECTED
MDMA (Ecstasy)Ur Screen: NOT DETECTED
METHADONE SCREEN, URINE: NOT DETECTED
Opiate, Ur Screen: NOT DETECTED
Phencyclidine (PCP) Ur S: NOT DETECTED
TRICYCLIC, UR SCREEN: POSITIVE — AB

## 2016-11-09 LAB — BASIC METABOLIC PANEL
ANION GAP: 6 (ref 5–15)
BUN: 9 mg/dL (ref 6–20)
CO2: 30 mmol/L (ref 22–32)
Calcium: 9.8 mg/dL (ref 8.9–10.3)
Chloride: 105 mmol/L (ref 101–111)
Creatinine, Ser: 1.11 mg/dL — ABNORMAL HIGH (ref 0.44–1.00)
GFR, EST NON AFRICAN AMERICAN: 56 mL/min — AB (ref >60)
Glucose, Bld: 122 mg/dL — ABNORMAL HIGH (ref 65–99)
POTASSIUM: 4.2 mmol/L (ref 3.5–5.1)
SODIUM: 141 mmol/L (ref 135–145)

## 2016-11-09 MED ORDER — MOMETASONE FURO-FORMOTEROL FUM 200-5 MCG/ACT IN AERO
2.0000 | INHALATION_SPRAY | Freq: Two times a day (BID) | RESPIRATORY_TRACT | Status: DC
  Administered 2016-11-09 – 2016-11-10 (×2): 2 via RESPIRATORY_TRACT
  Filled 2016-11-09: qty 8.8

## 2016-11-09 MED ORDER — IPRATROPIUM-ALBUTEROL 0.5-2.5 (3) MG/3ML IN SOLN: 3.0000 mL | Freq: Four times a day (QID) | RESPIRATORY_TRACT | Status: DC | PRN

## 2016-11-09 MED ORDER — DIAZEPAM 5 MG PO TABS
5.0000 mg | ORAL_TABLET | Freq: Once | ORAL | Status: AC
  Administered 2016-11-09: 5 mg via ORAL
  Filled 2016-11-09: qty 1

## 2016-11-09 MED ORDER — POLYETHYLENE GLYCOL 3350 17 G PO PACK: 17.0000 g | PACK | Freq: Every day | ORAL | Status: DC | PRN

## 2016-11-09 MED ORDER — POLYETHYLENE GLYCOL 3350 17 GM/SCOOP PO POWD
17.0000 g | Freq: Every day | ORAL | Status: DC
  Filled 2016-11-09: qty 255

## 2016-11-09 MED ORDER — FLUTICASONE PROPIONATE 50 MCG/ACT NA SUSP
2.0000 | Freq: Every day | NASAL | Status: DC | PRN
  Filled 2016-11-09: qty 16

## 2016-11-09 MED ORDER — SODIUM CHLORIDE 0.9% FLUSH
3.0000 mL | Freq: Two times a day (BID) | INTRAVENOUS | Status: DC
  Administered 2016-11-10: 3 mL via INTRAVENOUS

## 2016-11-09 MED ORDER — ASPIRIN 81 MG PO CHEW
CHEWABLE_TABLET | ORAL | Status: AC
  Administered 2016-11-09: 324 mg via ORAL
  Filled 2016-11-09: qty 4

## 2016-11-09 MED ORDER — POLYETHYLENE GLYCOL 3350 17 G PO PACK
17.0000 g | PACK | Freq: Every day | ORAL | Status: DC
  Administered 2016-11-09: 17 g via ORAL
  Filled 2016-11-09 (×2): qty 1

## 2016-11-09 MED ORDER — NITROGLYCERIN 0.4 MG SL SUBL: 0.4000 mg | SUBLINGUAL_TABLET | SUBLINGUAL | Status: DC | PRN

## 2016-11-09 MED ORDER — ONDANSETRON HCL 4 MG/2ML IJ SOLN: 4.0000 mg | Freq: Four times a day (QID) | INTRAMUSCULAR | Status: DC | PRN

## 2016-11-09 MED ORDER — NITROGLYCERIN 0.4 MG SL SUBL
SUBLINGUAL_TABLET | SUBLINGUAL | Status: AC
  Administered 2016-11-09: 0.4 mg via SUBLINGUAL
  Filled 2016-11-09: qty 1

## 2016-11-09 MED ORDER — ONDANSETRON HCL 4 MG PO TABS: 4.0000 mg | ORAL_TABLET | Freq: Four times a day (QID) | ORAL | Status: DC | PRN

## 2016-11-09 MED ORDER — TRAMADOL HCL 50 MG PO TABS
ORAL_TABLET | ORAL | Status: AC
  Administered 2016-11-10: 50 mg via ORAL
  Filled 2016-11-09: qty 1

## 2016-11-09 MED ORDER — TRAMADOL HCL 50 MG PO TABS
50.0000 mg | ORAL_TABLET | Freq: Four times a day (QID) | ORAL | Status: DC | PRN
  Administered 2016-11-09 – 2016-11-10 (×2): 50 mg via ORAL
  Filled 2016-11-09: qty 1

## 2016-11-09 MED ORDER — ACETAMINOPHEN 650 MG RE SUPP
650.0000 mg | Freq: Four times a day (QID) | RECTAL | Status: DC | PRN
Start: 2016-11-09 — End: 2016-11-10

## 2016-11-09 MED ORDER — ALBUTEROL SULFATE (2.5 MG/3ML) 0.083% IN NEBU: 2.5000 mg | INHALATION_SOLUTION | RESPIRATORY_TRACT | Status: DC | PRN

## 2016-11-09 MED ORDER — LISINOPRIL 10 MG PO TABS: 10.0000 mg | ORAL_TABLET | Freq: Every day | ORAL | Status: DC

## 2016-11-09 MED ORDER — ZOLPIDEM TARTRATE 5 MG PO TABS
5.0000 mg | ORAL_TABLET | Freq: Every day | ORAL | Status: DC
  Administered 2016-11-09: 5 mg via ORAL
  Filled 2016-11-09: qty 1

## 2016-11-09 MED ORDER — ATORVASTATIN CALCIUM 20 MG PO TABS
20.0000 mg | ORAL_TABLET | Freq: Every day | ORAL | Status: DC
  Administered 2016-11-10: 20 mg via ORAL
  Filled 2016-11-09: qty 1

## 2016-11-09 MED ORDER — SODIUM CHLORIDE 0.9% FLUSH: 3.0000 mL | INTRAVENOUS | Status: DC | PRN

## 2016-11-09 MED ORDER — HYDROXYZINE HCL 50 MG PO TABS
50.0000 mg | ORAL_TABLET | Freq: Four times a day (QID) | ORAL | Status: DC | PRN
  Filled 2016-11-09: qty 1

## 2016-11-09 MED ORDER — ALPRAZOLAM 1 MG PO TABS
1.0000 mg | ORAL_TABLET | Freq: Four times a day (QID) | ORAL | Status: DC
  Administered 2016-11-09 – 2016-11-10 (×3): 1 mg via ORAL
  Filled 2016-11-09 (×3): qty 1

## 2016-11-09 MED ORDER — GUAIFENESIN ER 600 MG PO TB12
600.0000 mg | ORAL_TABLET | Freq: Two times a day (BID) | ORAL | Status: DC
  Administered 2016-11-09 – 2016-11-10 (×2): 600 mg via ORAL
  Filled 2016-11-09 (×2): qty 1

## 2016-11-09 MED ORDER — ACETAMINOPHEN 325 MG PO TABS
650.0000 mg | ORAL_TABLET | Freq: Four times a day (QID) | ORAL | Status: DC | PRN
  Administered 2016-11-09: 650 mg via ORAL
  Filled 2016-11-09: qty 2

## 2016-11-09 MED ORDER — ENOXAPARIN SODIUM 40 MG/0.4ML ~~LOC~~ SOLN
40.0000 mg | Freq: Two times a day (BID) | SUBCUTANEOUS | Status: DC
  Administered 2016-11-09 – 2016-11-10 (×2): 40 mg via SUBCUTANEOUS
  Filled 2016-11-09 (×2): qty 0.4

## 2016-11-09 MED ORDER — SODIUM CHLORIDE 0.9% FLUSH
3.0000 mL | Freq: Two times a day (BID) | INTRAVENOUS | Status: DC
  Administered 2016-11-09 – 2016-11-10 (×2): 3 mL via INTRAVENOUS

## 2016-11-09 MED ORDER — ASPIRIN 81 MG PO CHEW
324.0000 mg | CHEWABLE_TABLET | Freq: Once | ORAL | Status: AC
  Administered 2016-11-09: 324 mg via ORAL

## 2016-11-09 MED ORDER — FAMOTIDINE 20 MG PO TABS
20.0000 mg | ORAL_TABLET | Freq: Two times a day (BID) | ORAL | Status: DC
  Administered 2016-11-09 – 2016-11-10 (×2): 20 mg via ORAL
  Filled 2016-11-09 (×2): qty 1

## 2016-11-09 MED ORDER — METOPROLOL TARTRATE 25 MG PO TABS
25.0000 mg | ORAL_TABLET | Freq: Two times a day (BID) | ORAL | Status: DC
Start: 2016-11-09 — End: 2016-11-10
  Administered 2016-11-10: 25 mg via ORAL
  Filled 2016-11-09 (×2): qty 1

## 2016-11-09 MED ORDER — NITROGLYCERIN 0.4 MG SL SUBL
0.4000 mg | SUBLINGUAL_TABLET | Freq: Once | SUBLINGUAL | Status: AC
Start: 2016-11-09 — End: 2016-11-09
  Administered 2016-11-09: 0.4 mg via SUBLINGUAL

## 2016-11-09 MED ORDER — GABAPENTIN 300 MG PO CAPS
300.0000 mg | ORAL_CAPSULE | Freq: Three times a day (TID) | ORAL | Status: DC
  Administered 2016-11-09 – 2016-11-10 (×2): 300 mg via ORAL
  Filled 2016-11-09 (×2): qty 1

## 2016-11-09 MED ORDER — SODIUM CHLORIDE 0.9 % IV SOLN: 250.0000 mL | INTRAVENOUS | Status: DC | PRN

## 2016-11-09 NOTE — Patient Instructions (Signed)
Nice to see you. Please keep your appointment with your cardiologist next week. Please continue the MiraLAX for constipation. If you develop persistent palpitations, chest pain, shortness of breath, numbness, weakness, or any new or changing symptoms please seek medical attention immediately.

## 2016-11-09 NOTE — Telephone Encounter (Signed)
Pt c/o of Chest Pain: STAT if CP now or developed within 24 hours  1. Are you having CP right now? Yes tightness and left [employer] pain   2. Are you experiencing any other symptoms (ex. SOB, nausea, vomiting, sweating)? No   3. How long have you been experiencing CP? Since last visit   4. Is your CP continuous or coming and going? Comes and goes   5. Have you taken Nitroglycerin?  No   Did ekg today at pcp office  ?

## 2016-11-09 NOTE — ED Notes (Signed)
ED Provider at bedside. 

## 2016-11-09 NOTE — Progress Notes (Signed)
Chaplain on-call provided education and a document of an Advance Directive to patient. When Chaplain on-call entered the room, patient was eating her dinner. She said that she would read through the document and complete it tomorrow.

## 2016-11-09 NOTE — Progress Notes (Signed)
Anticoagulation monitoring(Lovenox):  55 yo female ordered Lovenox 40 mg Q24h  Filed Weights   11/09/16 1653  Weight: 228 lb (103.4 kg)   BMI 41.7 kg    Lab Results  Component Value Date   CREATININE 1.11 (H) 11/09/2016   CREATININE 0.95 10/20/2016   CREATININE 1.01 10/17/2016   Estimated Creatinine Clearance: 66.1 mL/min (by C-G formula based on SCr of 1.11 mg/dL (H)). Hemoglobin & Hematocrit     Component Value Date/Time   HGB 14.9 11/09/2016 1653   HGB 12.5 10/04/2014 1033   HCT 42.3 11/09/2016 1653   HCT 37.9 10/04/2014 1033     Per Protocol for Patient with estCrcl > 30 ml/min and BMI > 40, will transition to Lovenox 40 mg Q12h.

## 2016-11-09 NOTE — Progress Notes (Signed)
Pre visit review using our clinic review tool, if applicable. No additional management support is needed unless otherwise documented below in the visit note. 

## 2016-11-09 NOTE — Assessment & Plan Note (Addendum)
Patient notes she has continued to have palpitations. Notes chest tightness that occurs with this though no chest pressure or shortness of breath. She's had an extensive evaluation in the hospital for this previously with reassuring echo and stress test. Recently had a 14 day event monitor that revealed some ventricular and supraventricular ectopy as well as a single run of SVT. She was started on metoprolol though has not noticed much of a difference yet. She is currently asymptomatic. EKG today with incomplete right bundle-branch block and anterior fascicular block. Discussed that I would send this to her cardiologist for review prior to determining if we should increase her metoprolol. Discussed that she does not hear from us prior seeing them next week she will discuss it with them at that time. Discussed that if she develops recurrent symptoms that do not resolve quickly she needs to seek medical attention immediately.

## 2016-11-09 NOTE — H&P (Signed)
[employer] -  at [facility]   PATIENT NAME: Rebecca Ramos    MR#:  [id number]  DATE OF BIRTH:  1961/03/03  DATE OF ADMISSION:  11/09/2016  PRIMARY CARE PHYSICIAN: Marikay Alar, MD   REQUESTING/REFERRING PHYSICIAN: Dr. Mayford Knife  CHIEF COMPLAINT:   Chief Complaint  Patient presents with  . Chest Pain    HISTORY OF PRESENT ILLNESS:  Rebecca Ramos  is a 55 y.o. female with a known history of SVT, hypertension, history of PE presents to the emergency room sent in by her cardiology office after patient called them regarding chest pain radiates to left arm. This in most cases is associated with palpitations although not all the time. She was recently diagnosed with SVT on Holter monitor and started on metoprolol. Pain can last anywhere from a few seconds to minutes. No recent stress test or cardiac catheterization. Troponin is normal. EKG shows no ST elevation.  PAST MEDICAL HISTORY:   Past Medical History:  Diagnosis Date  . Acid reflux   . Anxiety   . Chronic lower back pain    osteoarthritis in luber spine  . COPD (chronic obstructive pulmonary disease) (HCC)   . Depression    Bipolar  . Heart murmur   . History of degenerative disc disease   . History of pulmonary embolus (PE)   . HTN (hypertension) 03/20/2015  . Hypertension   . Osteoarthritis of lumbosacral spine   . Right Achilles tendinitis   . Thrombus    right lung in 2015, off anticoagulation now  . Transaminitis     PAST SURGICAL HISTORY:   Past Surgical History:  Procedure Laterality Date  . ABDOMINAL HYSTERECTOMY    . CESAREAN SECTION Bilateral   . CESAREAN SECTION Bilateral   . ELBOW SURGERY     bilateral  . thumb surgery     right   . TONSILLECTOMY      SOCIAL HISTORY:   Social History  Substance Use Topics  . Smoking status: Never Smoker  . Smokeless tobacco: Never Used  . Alcohol use No    FAMILY HISTORY:   Family History  Problem Relation Age of Onset  . CAD  Mother   . Heart disease Mother     Pacemaker  . CAD Sister   . Heart disease Sister     Heart surgery x 1  . Throat cancer Brother   . CAD Maternal Grandmother   . CAD Maternal Grandfather     DRUG ALLERGIES:   Allergies  Allergen Reactions  . Honey Bee Venom Anaphylaxis  . Sulfacetamide Sodium Hives and Swelling  . Levofloxacin Nausea Only and Rash  . Sulfa Antibiotics Hives and Swelling  . Codeine Rash    REVIEW OF SYSTEMS:   Review of Systems  Constitutional: Positive for diaphoresis and malaise/fatigue. Negative for chills, fever and weight loss.  HENT: Negative for hearing loss and nosebleeds.   Eyes: Negative for blurred vision, double vision and pain.  Respiratory: Negative for cough, hemoptysis, sputum production, shortness of breath and wheezing.   Cardiovascular: Positive for chest pain and palpitations. Negative for orthopnea and leg swelling.  Gastrointestinal: Negative for abdominal pain, constipation, diarrhea, nausea and vomiting.  Genitourinary: Negative for dysuria and hematuria.  Musculoskeletal: Negative for back pain, falls and myalgias.  Skin: Negative for rash.  Neurological: Negative for dizziness, tremors, sensory change, speech change, focal weakness, seizures and headaches.  Endo/Heme/Allergies: Does not bruise/bleed easily.  Psychiatric/Behavioral: Negative for depression and memory loss. The patient  is nervous/anxious.     MEDICATIONS AT HOME:   Prior to Admission medications   Medication Sig Start Date End Date Taking? Authorizing Provider  albuterol (PROVENTIL HFA;VENTOLIN HFA) 108 (90 Base) MCG/ACT inhaler Inhale 1 puff into the lungs every 4 (four) hours. 11/18/15 11/17/16  Historical Provider, MD  ALPRAZolam Prudy Feeler(XANAX) 1 MG tablet  05/18/16   Historical Provider, MD  amLODipine (NORVASC) 5 MG tablet TAKE 1 TABLET BY MOUTH EVERY DAY 07/11/16   Glori LuisEric G Sonnenberg, MD  atorvastatin (LIPITOR) 20 MG tablet Take 1 tablet (20 mg total) by mouth daily.  06/10/16   Amy Rusty AusLauren Krebs, NP  Blood Pressure Monitoring (BLOOD PRESSURE CUFF) MISC 1 each by Does not apply route daily. 06/16/16   Amy Rusty AusLauren Krebs, NP  budesonide-formoterol (SYMBICORT) 160-4.5 MCG/ACT inhaler Inhale 2 puffs into the lungs 2 (two) times daily. 09/27/16 09/27/17  Glori LuisEric G Sonnenberg, MD  clindamycin (CLEOCIN T) 1 % external solution Apply topically 2 (two) times daily.    Historical Provider, MD  COMBIVENT RESPIMAT 20-100 MCG/ACT AERS respimat Inhale 1 puff into the lungs 4 (four) times daily as needed. For wheezing. 06/10/16   Amy Rusty AusLauren Krebs, NP  cyanocobalamin (,VITAMIN B-12,) 1000 MCG/ML injection Inject into the muscle. 01/13/16   Historical Provider, MD  dicyclomine (BENTYL) 20 MG tablet TAKE 1 TABLET BY MOUTH THREE TIMES DAILY AS NEEDED FOR CRAMPY ABDOMINAL PAIN 05/02/16 02/15/17  Historical Provider, MD  erythromycin with ethanol (EMGEL) 2 % gel Apply topically daily. Apply topically twice daily to facial rash as needed    Historical Provider, MD  esomeprazole (NEXIUM) 20 MG capsule Take 1 capsule (20 mg total) by mouth 2 (two) times daily before a meal. 09/27/16   Glori LuisEric G Sonnenberg, MD  fluticasone (FLONASE) 50 MCG/ACT nasal spray Place 2 sprays into both nostrils daily as needed for rhinitis.     Historical Provider, MD  furosemide (LASIX) 20 MG tablet TAKE 1 TABLET(20 MG) BY MOUTH DAILY 09/30/16   Glori LuisEric G Sonnenberg, MD  gabapentin (NEURONTIN) 300 MG capsule Take 300 mg by mouth 3 (three) times daily. 03/18/16   Historical Provider, MD  hydrOXYzine (ATARAX/VISTARIL) 50 MG tablet Take 50 mg by mouth 4 (four) times daily as needed.    Historical Provider, MD  hydrOXYzine (VISTARIL) 50 MG capsule  09/03/16   Historical Provider, MD  ipratropium-albuterol (DUONEB) 0.5-2.5 (3) MG/3ML SOLN Take 3 mLs by nebulization every 4 (four) hours as needed. 06/08/16   Katharina Caperima Vaickute, MD  lisinopril (PRINIVIL,ZESTRIL) 10 MG tablet Take 10 mg by mouth daily.    Historical Provider, MD  metoprolol  tartrate (LOPRESSOR) 25 MG tablet Take 0.5 tablets (12.5 mg total) by mouth 2 (two) times daily. 11/01/16   Yvonne Kendallhristopher End, MD  omega-3 acid ethyl esters (LOVAZA) 1 g capsule  06/17/16   Historical Provider, MD  ondansetron (ZOFRAN) 4 MG tablet Take 1 tablet (4 mg total) by mouth every 8 (eight) hours as needed for nausea or vomiting. 09/27/16   Glori LuisEric G Sonnenberg, MD  polyethylene glycol powder (GLYCOLAX/MIRALAX) powder Take 17 g by mouth daily. 10/21/16   Glori LuisEric G Sonnenberg, MD  traMADol (ULTRAM) 50 MG tablet TAKE 1 TABLET BY MOUTH EVERY 8 HOURS AS NEEDED FOR MODERATE OR SEVERE PAIN 11/04/16   Glori LuisEric G Sonnenberg, MD  Vitamin D, Ergocalciferol, (DRISDOL) 50000 units CAPS capsule Take 50,000 Units by mouth once a week. 03/16/16   Historical Provider, MD  zolpidem (AMBIEN) 10 MG tablet  06/20/16   Historical Provider, MD  VITAL SIGNS:  Blood pressure 102/82, pulse (!) 59, temperature 97.9 F (36.6 C), temperature source Oral, resp. rate 20, weight 103.4 kg (228 lb), SpO2 100 %.  PHYSICAL EXAMINATION:  Physical Exam  GENERAL:  55 y.o.-year-old patient lying in the bed with no acute distress. Obese EYES: Pupils equal, round, reactive to light and accommodation. No scleral icterus. Extraocular muscles intact.  HEENT: Head atraumatic, normocephalic. Oropharynx and nasopharynx clear. No oropharyngeal erythema, moist oral mucosa  NECK:  Supple, no jugular venous distention. No thyroid enlargement, no tenderness.  LUNGS: Normal breath sounds bilaterally, no wheezing, rales, rhonchi. No use of accessory muscles of respiration.  CARDIOVASCULAR: S1, S2 normal. No murmurs, rubs, or gallops.  ABDOMEN: Soft, nontender, nondistended. Bowel sounds present. No organomegaly or mass.  EXTREMITIES: No pedal edema, cyanosis, or clubbing. + 2 pedal & radial pulses b/l.   NEUROLOGIC: Cranial nerves II through XII are intact. No focal Motor or sensory deficits appreciated b/l PSYCHIATRIC: The patient is alert and  oriented x 3. Good affect.  SKIN: No obvious rash, lesion, or ulcer.   LABORATORY PANEL:   CBC  Recent Labs Lab 11/09/16 1653  WBC 6.7  HGB 14.9  HCT 42.3  PLT 316   ------------------------------------------------------------------------------------------------------------------  Chemistries   Recent Labs Lab 11/09/16 1653  NA 141  K 4.2  CL 105  CO2 30  GLUCOSE 122*  BUN 9  CREATININE 1.11*  CALCIUM 9.8   ------------------------------------------------------------------------------------------------------------------  Cardiac Enzymes  Recent Labs Lab 11/09/16 1653  TROPONINI <0.03   ------------------------------------------------------------------------------------------------------------------  RADIOLOGY:  Dg Chest 2 View  Result Date: 11/09/2016 CLINICAL DATA:  Chest pain. EXAM: CHEST  2 VIEW COMPARISON:  Radiographs of June 06, 2016. FINDINGS: The heart size and mediastinal contours are within normal limits. Both lungs are clear. No pneumothorax or pleural effusion is noted. The visualized skeletal structures are unremarkable. IMPRESSION: No active cardiopulmonary disease. Electronically Signed   By: Lupita RaiderJames  Green Jr, M.D.   On: 11/09/2016 17:37     IMPRESSION AND PLAN:   * Chest pain radiating to left arm This pain mainly seems to be associated with her palpitations. With recent diagnosis of SVT could be the most likely cause. But we will need to rule out acute coronary syndrome. Will admit patient under observation telemetry unit. Check 2 more sets of troponin. Consult cardiology. Will keep her nothing by mouth after midnight for either a stress test or cardiac catheter tomorrow depending on cardiology evaluation. Nitroglycerin when necessary. Continue patient's metoprolol.  * SVT Recent diagnosis with Holter. Presently on metoprolol and will be continued.  * DVT prophylaxis with Lovenox  All the records are reviewed and case discussed with ED  provider. Management plans discussed with the patient, family and they are in agreement.  CODE STATUS: FULL CODE  TOTAL TIME TAKING CARE OF THIS PATIENT: 40 minutes.   Milagros LollSudini, Cressie Betzler R M.D on 11/09/2016 at 7:12 PM  Between 7am to 6pm - Pager - [phone]  After 6pm go to [email or web address] - password [email or web address]  [facility] [place] [facility]  Office  [phone]  CC: Primary care physician; Marikay AlarEric Sonnenberg, MD  Note: This dictation was prepared with Dragon dictation along with smaller phrase technology. Any transcriptional errors that result from this process are unintentional.

## 2016-11-09 NOTE — Assessment & Plan Note (Signed)
Stable. Asymptomatic. Continue Symbicort.

## 2016-11-09 NOTE — Telephone Encounter (Signed)
Spoke w/ pt.  She reports increased episodes of chest tightness and has now developed left arm pain.  She reports that pain woke her up from sleep last night. She had EKG at PCP's office this am.  Printed this and previous EKG for Eula Listenyan Dunn, PA to review. Per Alycia Rossettiyan, pt needs to proceed to ED, as she may need cath. Pt is at home w/ her boyfriend and she states that neither of them have a car. She will attempt to get a ride, and if not, she will call 911 for ambulance transport. Asked pt to call back if we can be of further assistance.

## 2016-11-09 NOTE — ED Provider Notes (Signed)
[facility] Emergency Department Provider Note        Time seen: ----------------------------------------- 5:57 PM on 11/09/2016 -----------------------------------------    I have reviewed the triage vital signs and the nursing notes.   HISTORY  Chief Complaint Chest Pain    HPI Rebecca Ramos is a 55 y.o. female who presents to the ER for chest tightness.Patient went to see her primary care doctor today for a variety of complaints such as abdominal pain and shortness of breath. While at her doctor's office she had an EKG done which was sent to Dr. Okey DupreEnd from cardiology who advised patient to come to the ER for further evaluation and that she would possibly need a heart catheterization. Patient states nothing is made her chest tightness better or worse. She denies fevers, chills or other complaints.   Past Medical History:  Diagnosis Date  . Acid reflux   . Anxiety   . Chronic lower back pain    osteoarthritis in luber spine  . COPD (chronic obstructive pulmonary disease) (HCC)   . Depression    Bipolar  . Heart murmur   . History of degenerative disc disease   . History of pulmonary embolus (PE)   . HTN (hypertension) 03/20/2015  . Hypertension   . Osteoarthritis of lumbosacral spine   . Right Achilles tendinitis   . Thrombus    right lung in 2015, off anticoagulation now  . Transaminitis     Patient Active Problem List   Diagnosis Date Noted  . Palpitations 11/09/2016  . Abdominal pain, left upper quadrant 10/20/2016  . Nausea without vomiting 08/09/2016  . Pedal edema 08/09/2016  . Frequent urination 06/23/2016  . Chronic pain syndrome 06/23/2016  . GERD (gastroesophageal reflux disease) 06/10/2016  . COPD (chronic obstructive pulmonary disease) (HCC) 06/08/2016  . Acute bronchitis 06/08/2016  . Hyperlipidemia 06/08/2016  . Obesity 06/08/2016  . Cocaine abuse 06/08/2016  . Tobacco abuse counseling 06/08/2016  . Chest pain 06/06/2016   . Cocaine use disorder, moderate, dependence (HCC) 03/28/2016  . Severe recurrent major depression without psychotic features (HCC) 03/26/2016  . Suicide attempt 03/26/2016  . Chronic back pain 03/26/2016  . HTN (hypertension) 03/20/2015    Past Surgical History:  Procedure Laterality Date  . ABDOMINAL HYSTERECTOMY    . CESAREAN SECTION Bilateral   . CESAREAN SECTION Bilateral   . ELBOW SURGERY     bilateral  . thumb surgery     right   . TONSILLECTOMY      Allergies Honey bee venom; Sulfacetamide sodium; Levofloxacin; Sulfa antibiotics; and Codeine  Social History Social History  Substance Use Topics  . Smoking status: Never Smoker  . Smokeless tobacco: Never Used  . Alcohol use No    Review of Systems Constitutional: Negative for fever. Cardiovascular: Positive for chest tightness Respiratory: Positive shortness of breath Gastrointestinal: Negative for abdominal pain, vomiting and diarrhea. Genitourinary: Negative for dysuria. Musculoskeletal: Negative for back pain. Skin: Negative for rash. Neurological: Negative for headaches, focal weakness or numbness.  10-point ROS otherwise negative.  ____________________________________________   PHYSICAL EXAM:  VITAL SIGNS: ED Triage Vitals [11/09/16 1653]  Enc Vitals Group     BP 119/78     Pulse      Resp 20     Temp 97.9 F (36.6 C)     Temp Source Oral     SpO2 100 %     Weight 228 lb (103.4 kg)     Height  Head Circumference      Peak Flow      Pain Score 7     Pain Loc      Pain Edu?      Excl. in GC?     Constitutional: Alert and oriented. Well appearing and in no distress. Eyes: Conjunctivae are normal. PERRL. Normal extraocular movements. ENT   Head: Normocephalic and atraumatic.   Nose: No congestion/rhinnorhea.   Mouth/Throat: Mucous membranes are moist.   Neck: No stridor. Cardiovascular: Normal rate, regular rhythm. No murmurs, rubs, or gallops. Respiratory: Normal  respiratory effort without tachypnea nor retractions. Breath sounds are clear and equal bilaterally. No wheezes/rales/rhonchi. Gastrointestinal: Soft and nontender. Normal bowel sounds Musculoskeletal: Nontender with normal range of motion in all extremities. No lower extremity tenderness nor edema. Neurologic:  Normal speech and language. No gross focal neurologic deficits are appreciated.  Skin:  Skin is warm, dry and intact. No rash noted. Psychiatric: Mood and affect are normal. Speech and behavior are normal.  ____________________________________________  EKG: Interpreted by me.Sinus rhythm rate of 61 bpm, normal PR interval, LVH, normal QT, left axis deviation  ____________________________________________  ED COURSE:  Pertinent labs & imaging results that were available during my care of the patient were reviewed by me and considered in my medical decision making (see chart for details). Clinical Course   Patient presents to the ER in no distress, we will assess with labs and imaging.  Procedures ____________________________________________   LABS (pertinent positives/negatives)  Labs Reviewed  BASIC METABOLIC PANEL - Abnormal; Notable for the following:       Result Value   Glucose, Bld 122 (*)    Creatinine, Ser 1.11 (*)    GFR calc non Af Amer 56 (*)    All other components within normal limits  CBC  TROPONIN I    RADIOLOGY  Chest x-ray is normal  ____________________________________________  FINAL ASSESSMENT AND PLAN  Chest pain  Plan: Patient with labs and imaging as dictated above. Patient was given aspirin and nitroglycerin for chest pain. She was also given oral Valium. It is documented from Dr. Serita KyleEnd's office and the note today that she may need a heart catheterization. She is currently stable, I will discuss with the hospitalist for observation or possible admission.   Emily FilbertWilliams, Necole Minassian E, MD   Note: This dictation was prepared with Dragon dictation.  Any transcriptional errors that result from this process are unintentional    Emily FilbertJonathan E Nevyn Bossman, MD 11/09/16 1800

## 2016-11-09 NOTE — Progress Notes (Signed)
Rebecca AlarEric Lenorris Karger, MD Phone: [phone]  Rebecca Ramos who presents today for follow-up.  Abdominal pain/constipation: Patient notes this is quite a bit better since starting on the MiraLAX. Takes it about twice a week. Has had more stools since then. Has a bowel movement every other day. Notes occasionally watery stools versus small hard balls. No blood in her stool.  COPD: Notes this is stable. No shortness of breath. No wheezing. No cough. Using her Symbicort and Combivent.  Palpitations: Patient notes she's continued to have these. She had a 14 day event monitor which revealed rare supraventricular and ventricular ectopy as well as a single 4 beat run of SVT. They started her on metoprolol 12.5 mg twice daily and she increased it at the urging of her pharmacist per her report to 25 mg twice daily. Gets these episodes occasionally has some chest tightness with him. Notes some mild diaphoresis with them. No chest pressure or shortness of breath with it. No neck pain. Has started to get some tingly sensations and numbness down the radial aspect of her left arm with this. Notes it resolves after the palpitations resolve. She notes none of these symptoms at this time.  PMH: nonsmoker.   ROS see history of present illness  Objective  Physical Exam Vitals:   11/09/16 1100  BP: (!) 130/94  Pulse: 97  Temp: 98 F (36.7 C)    BP Readings from Last 3 Encounters:  11/09/16 102/82  11/09/16 (!) 130/94  10/20/16 110/72   Wt Readings from Last 3 Encounters:  11/09/16 228 lb (103.4 kg)  11/09/16 228 lb (103.4 kg)  10/20/16 230 lb (104.3 kg)    Physical Exam  Constitutional: No distress.  HENT:  Head: Normocephalic and atraumatic.  Cardiovascular: Normal rate, regular rhythm and normal heart sounds.   Pulmonary/Chest: Effort normal and breath sounds normal.  Abdominal: Soft. Bowel sounds are normal. She exhibits no distension. There is no tenderness. There is no  rebound and no guarding.  Musculoskeletal:  No midline neck tenderness, no midline neck step off  Neurological: She is alert. Gait normal.  5/5 strength in bilateral biceps, triceps, grip, quads, hamstrings, plantar and dorsiflexion, sensation to light touch intact in bilateral UE and LE, normal gait, 2+ patellar reflexes  Skin: Skin is warm and dry. She is not diaphoretic.   EKG: Normal sinus rhythm, RSR prime and anterior fascicular block, inverted T wave in lead 3 and flattened T-wave in aVF that are similar to prior, no ST changes, no other T-wave changes  Assessment/Plan: Please see individual problem list.  COPD (chronic obstructive pulmonary disease) (HCC) Stable. Asymptomatic. Continue Symbicort.  Abdominal pain, left upper quadrant Much improved with treatment constipation. She'll continue as needed MiraLAX. Continue to monitor. If recurrent pain she'll let us know. Given return precautions.  Palpitations Patient notes she has continued to have palpitations. Notes chest tightness that occurs with this though no chest pressure or shortness of breath. She's had an extensive evaluation in the hospital for this previously with reassuring echo and stress test. Recently had a 14 day event monitor that revealed some ventricular and supraventricular ectopy as well as a single run of SVT. She was started on metoprolol though has not noticed much of a difference yet. She is currently asymptomatic. EKG today with incomplete right bundle-branch block and anterior fascicular block. Discussed that I would send this to her cardiologist for review prior to determining if we should increase her metoprolol. Discussed  that she does not hear from us prior seeing them next week she will discuss it with them at that time. Discussed that if she develops recurrent symptoms that do not resolve quickly she needs to seek medical attention immediately.  Fatty liver Noted on CT scan. Has had mildly elevated LFTs as  well. We will obtain hepatitis serologies to evaluate for other causes.    Orders Placed This Encounter  Procedures  . Hepatitis C Antibody  . Hepatitis A Ab, Total  . Hepatitis B Core Antibody, total  . Hepatitis B surface antibody  . Hepatitis B Surface AntiGEN  . EKG 12-Lead   Rebecca AlarEric Randalyn Ahmed, MD [facility] [place]

## 2016-11-09 NOTE — ED Notes (Signed)
Transporting patient to room 245-2A

## 2016-11-09 NOTE — Assessment & Plan Note (Signed)
Noted on CT scan. Has had mildly elevated LFTs as well. We will obtain hepatitis serologies to evaluate for other causes.

## 2016-11-09 NOTE — Assessment & Plan Note (Signed)
Much improved with treatment constipation. She'll continue as needed MiraLAX. Continue to monitor. If recurrent pain she'll let us know. Given return precautions.

## 2016-11-10 ENCOUNTER — Telehealth: Payer: Self-pay | Admitting: Family Medicine

## 2016-11-10 DIAGNOSIS — N289 Disorder of kidney and ureter, unspecified: Secondary | ICD-10-CM | POA: Diagnosis not present

## 2016-11-10 DIAGNOSIS — R0789 Other chest pain: Secondary | ICD-10-CM | POA: Diagnosis not present

## 2016-11-10 DIAGNOSIS — R002 Palpitations: Secondary | ICD-10-CM | POA: Diagnosis not present

## 2016-11-10 DIAGNOSIS — R079 Chest pain, unspecified: Secondary | ICD-10-CM | POA: Diagnosis not present

## 2016-11-10 DIAGNOSIS — I10 Essential (primary) hypertension: Secondary | ICD-10-CM | POA: Diagnosis not present

## 2016-11-10 DIAGNOSIS — F419 Anxiety disorder, unspecified: Secondary | ICD-10-CM

## 2016-11-10 DIAGNOSIS — R51 Headache: Secondary | ICD-10-CM

## 2016-11-10 DIAGNOSIS — R519 Headache, unspecified: Secondary | ICD-10-CM

## 2016-11-10 LAB — CREATININE, SERUM
CREATININE: 1.05 mg/dL — AB (ref 0.44–1.00)
GFR calc Af Amer: >60 mL/min (ref >60)
GFR, EST NON AFRICAN AMERICAN: 60 mL/min — AB (ref >60)

## 2016-11-10 LAB — HEPATITIS B SURFACE ANTIBODY,QUALITATIVE: Hep B S Ab: POSITIVE — AB

## 2016-11-10 LAB — HEPATITIS A ANTIBODY, TOTAL: HEP A TOTAL AB: NONREACTIVE

## 2016-11-10 LAB — TROPONIN I: Troponin I: <0.03 ng/mL (ref <0.03)

## 2016-11-10 LAB — HEPATITIS C ANTIBODY: HCV AB: NEGATIVE

## 2016-11-10 LAB — HEPATITIS B CORE ANTIBODY, TOTAL: HEP B C TOTAL AB: REACTIVE — AB

## 2016-11-10 LAB — HEPATITIS B SURFACE ANTIGEN: Hepatitis B Surface Ag: NEGATIVE

## 2016-11-10 MED ORDER — MORPHINE SULFATE (PF) 4 MG/ML IV SOLN: 2.0000 mg | INTRAVENOUS | Status: DC | PRN

## 2016-11-10 MED ORDER — SERTRALINE HCL 50 MG PO TABS
25.0000 mg | ORAL_TABLET | Freq: Every day | ORAL | Status: DC
  Administered 2016-11-10: 25 mg via ORAL
  Filled 2016-11-10: qty 1

## 2016-11-10 MED ORDER — SERTRALINE HCL 25 MG PO TABS: 25.0000 mg | ORAL_TABLET | Freq: Every day | ORAL | 5 refills | Status: DC

## 2016-11-10 MED ORDER — METOPROLOL TARTRATE 25 MG PO TABS
25.0000 mg | ORAL_TABLET | Freq: Two times a day (BID) | ORAL | 5 refills | Status: DC
Start: 2016-11-10 — End: 2016-11-29

## 2016-11-10 MED ORDER — OXYCODONE HCL 5 MG PO TABS
5.0000 mg | ORAL_TABLET | Freq: Four times a day (QID) | ORAL | Status: DC | PRN
  Administered 2016-11-10: 5 mg via ORAL
  Filled 2016-11-10: qty 1

## 2016-11-10 NOTE — Discharge Summary (Signed)
[facility] [place] [facility]   PATIENT NAME: Rebecca ParcelCarol Ramos    MR#:  [id number]  DATE OF BIRTH:  November 27, 1960  DATE OF ADMISSION:  11/09/2016 ADMITTING PHYSICIAN: Milagros LollSrikar Sudini, MD  DATE OF DISCHARGE: 11/10/2016  2:28 PM  PRIMARY CARE PHYSICIAN: Marikay AlarEric Sonnenberg, MD     ADMISSION DIAGNOSIS:  Nonspecific chest pain [R07.9]  DISCHARGE DIAGNOSIS:  Principal Problem:   Atypical chest pain Active Problems:   COPD (chronic obstructive pulmonary disease) (HCC)   HTN (hypertension)   Chronic pain syndrome   Palpitations   Anxiety   Cocaine use disorder, moderate, dependence (HCC)   Headache   SECONDARY DIAGNOSIS:   Past Medical History:  Diagnosis Date  . Acid reflux   . Anxiety   . Chronic lower back pain    osteoarthritis in luber spine  . COPD (chronic obstructive pulmonary disease) (HCC)   . Depression    Bipolar  . Heart murmur   . History of degenerative disc disease   . History of pulmonary embolus (PE)   . HTN (hypertension) 03/20/2015  . Hypertension   . Osteoarthritis of lumbosacral spine   . Right Achilles tendinitis   . Thrombus    right lung in 2015, off anticoagulation now  . Transaminitis     .pro HOSPITAL COURSE:   The patient is 55 year old Caucasian female with medical history significant for history of pulmonary embolism, hypertension, depression, SVT, who presents to the hospital with complaints of left-sided chest pain radiating to left arm, intermittent heart palpitations. EKG showed no ST-T elevation. Troponin was normal. Patient was seen by cardiologist, recommended to advance Metoprolol to control palpitations, follow-up as outpatient cardiologist for recommendations Discussion by problem: #1. Atypical chest pain, resolved, continue metoprolol, follow up with cardiologist for further recommendations #2, palpitations, continue metoprolol, no abnormalities on telemetry. No hypoxia on room air, no dyspnea reported #3.  Anxiety, initiate Zoloft, continue Xanax at prior doses #4. Headache due to nitroglycerin, 1 dose of oxycodone/Norco was given, resolved #5. History of cocaine abuse, urine drug screen was negative during this admission #6. Essential hypertension, continue outpatient medications, discontinue Norvasc and blood pressure is low, discussed with patient's family  DISCHARGE CONDITIONS:   Stable  CONSULTS OBTAINED:  Treatment Team:  Yvonne Kendallhristopher End, MD Antonieta Ibaimothy J Gollan, MD  DRUG ALLERGIES:   Allergies  Allergen Reactions  . Honey Bee Venom Anaphylaxis  . Sulfacetamide Sodium Hives and Swelling  . Levofloxacin Nausea Only and Rash  . Sulfa Antibiotics Hives and Swelling  . Codeine Rash    DISCHARGE MEDICATIONS:   Discharge Medication List as of 11/10/2016  1:56 PM    START taking these medications   Details  sertraline (ZOLOFT) 25 MG tablet Take 1 tablet (25 mg total) by mouth daily., Starting Fri 11/11/2016, Normal      CONTINUE these medications which have CHANGED   Details  metoprolol tartrate (LOPRESSOR) 25 MG tablet Take 1 tablet (25 mg total) by mouth 2 (two) times daily., Starting Thu 11/10/2016, Normal      CONTINUE these medications which have NOT CHANGED   Details  albuterol (PROVENTIL HFA;VENTOLIN HFA) 108 (90 Base) MCG/ACT inhaler Inhale 1 puff into the lungs every 4 (four) hours., Starting Wed 11/18/2015, Until Thu 11/17/2016, Historical Med    ALPRAZolam (XANAX) 1 MG tablet Take 1 mg by mouth 4 (four) times daily. , Starting Wed 05/18/2016, Historical Med    amLODipine (NORVASC) 5 MG tablet TAKE 1 TABLET BY MOUTH EVERY DAY, Normal  atorvastatin (LIPITOR) 20 MG tablet Take 1 tablet (20 mg total) by mouth daily., Starting Fri 06/10/2016, Normal    budesonide-formoterol (SYMBICORT) 160-4.5 MCG/ACT inhaler Inhale 2 puffs into the lungs 2 (two) times daily., Starting Tue 09/27/2016, Until Wed 09/27/2017, Normal    COMBIVENT RESPIMAT 20-100 MCG/ACT AERS respimat Inhale 1  puff into the lungs 4 (four) times daily as needed. For wheezing., Starting Fri 06/10/2016, Normal    cyanocobalamin (,VITAMIN B-12,) 1000 MCG/ML injection Inject into the muscle., Starting Wed 01/13/2016, Historical Med    fluticasone (FLONASE) 50 MCG/ACT nasal spray Place 2 sprays into both nostrils daily as needed for rhinitis. , Historical Med    gabapentin (NEURONTIN) 300 MG capsule Take 300 mg by mouth 3 (three) times daily., Starting Fri 03/18/2016, Historical Med    hydrOXYzine (ATARAX/VISTARIL) 50 MG tablet Take 50 mg by mouth 4 (four) times daily as needed., Historical Med    lisinopril (PRINIVIL,ZESTRIL) 10 MG tablet Take 10 mg by mouth daily., Historical Med    omega-3 acid ethyl esters (LOVAZA) 1 g capsule Starting Fri 06/17/2016, Historical Med    ondansetron (ZOFRAN) 4 MG tablet Take 1 tablet (4 mg total) by mouth every 8 (eight) hours as needed for nausea or vomiting., Starting Tue 09/27/2016, Normal    polyethylene glycol powder (GLYCOLAX/MIRALAX) powder Take 17 g by mouth daily., Starting Fri 10/21/2016, Normal    ranitidine (ZANTAC) 150 MG tablet Take 150 mg by mouth 2 (two) times daily as needed for heartburn., Historical Med    traMADol (ULTRAM) 50 MG tablet TAKE 1 TABLET BY MOUTH EVERY 8 HOURS AS NEEDED FOR MODERATE OR SEVERE PAIN, Phone In    Vitamin D, Ergocalciferol, (DRISDOL) 50000 units CAPS capsule Take 50,000 Units by mouth once a week. monday, Starting Wed 03/16/2016, Historical Med    zolpidem (AMBIEN) 10 MG tablet Take 10 mg by mouth at bedtime. , Starting Mon 06/20/2016, Historical Med    Blood Pressure Monitoring (BLOOD PRESSURE CUFF) MISC 1 each by Does not apply route daily., Starting Thu 06/16/2016, Normal    esomeprazole (NEXIUM) 20 MG capsule Take 1 capsule (20 mg total) by mouth 2 (two) times daily before a meal., Starting Tue 09/27/2016, Normal    furosemide (LASIX) 20 MG tablet TAKE 1 TABLET(20 MG) BY MOUTH DAILY, Normal    ipratropium-albuterol (DUONEB)  0.5-2.5 (3) MG/3ML SOLN Take 3 mLs by nebulization every 4 (four) hours as needed., Starting Wed 06/08/2016, Normal         DISCHARGE INSTRUCTIONS:    Patient is to follow-up with primary cardiologist as outpatient, primary care physician within one week after discharge  If you experience worsening of your admission symptoms, develop shortness of breath, life threatening emergency, suicidal or homicidal thoughts you must seek medical attention immediately by calling 911 or calling your MD immediately  if symptoms less severe.  You Must read complete instructions/literature along with all the possible adverse reactions/side effects for all the Medicines you take and that have been prescribed to you. Take any new Medicines after you have completely understood and accept all the possible adverse reactions/side effects.   Please note  You were cared for by a hospitalist during your hospital stay. If you have any questions about your discharge medications or the care you received while you were in the hospital after you are discharged, you can call the unit and asked to speak with the hospitalist on call if the hospitalist that took care of you is not available. Once you are  discharged, your primary care physician will handle any further medical issues. Please note that NO REFILLS for any discharge medications will be authorized once you are discharged, as it is imperative that you return to your primary care physician (or establish a relationship with a primary care physician if you do not have one) for your aftercare needs so that they can reassess your need for medications and monitor your lab values.    Today   CHIEF COMPLAINT:   Chief Complaint  Patient presents with  . Chest Pain    HISTORY OF PRESENT ILLNESS:  Rebecca Ramos  is a 55 y.o. female with a known history of pulmonary embolism, hypertension, depression, SVT, who presents to the hospital with complaints of left-sided chest pain  radiating to left arm, intermittent heart palpitations. EKG showed no ST-T elevation. Troponin was normal. Patient was seen by cardiologist, recommended to advance Metoprolol to control palpitations, follow-up as outpatient cardiologist for recommendations Discussion by problem: #1. Atypical chest pain, resolved, continue metoprolol, follow up with cardiologist for further recommendations. No hypoxia on room air, no dyspnea reported #2, palpitations, continue metoprolol, no abnormalities on telemetry #3. Anxiety, initiate Zoloft, continue Xanax at prior doses #4. Headache due to nitroglycerin, 1 dose of oxycodone/Norco was given, resolved #5. History of cocaine abuse, urine drug screen was negative during this admission #6. Essential hypertension, continue outpatient medications, discontinue Norvasc and blood pressure is low, discussed with patient's family    VITAL SIGNS:  Blood pressure 101/64, pulse (!) 54, temperature 97.9 F (36.6 C), temperature source Oral, resp. rate 19, weight 102.2 kg (225 lb 3.2 oz), SpO2 98 %.  I/O:   Intake/Output Summary (Last 24 hours) at 11/10/16 1436 Last data filed at 11/10/16 1313  Gross per 24 hour  Intake              363 ml  Output              600 ml  Net             -237 ml    PHYSICAL EXAMINATION:  GENERAL:  55 y.o.-year-old patient lying in the bed with no acute distress.  EYES: Pupils equal, round, reactive to light and accommodation. No scleral icterus. Extraocular muscles intact.  HEENT: Head atraumatic, normocephalic. Oropharynx and nasopharynx clear.  NECK:  Supple, no jugular venous distention. No thyroid enlargement, no tenderness.  LUNGS: Normal breath sounds bilaterally, no wheezing, rales,rhonchi or crepitation. No use of accessory muscles of respiration.  CARDIOVASCULAR: S1, S2 normal. No murmurs, rubs, or gallops.  ABDOMEN: Soft, non-tender, non-distended. Bowel sounds present. No organomegaly or mass.  EXTREMITIES: No pedal  edema, cyanosis, or clubbing.  NEUROLOGIC: Cranial nerves II through XII are intact. Muscle strength 5/5 in all extremities. Sensation intact. Gait not checked.  PSYCHIATRIC: The patient is alert and oriented x 3.  SKIN: No obvious rash, lesion, or ulcer.   DATA REVIEW:   CBC  Recent Labs Lab 11/09/16 1653  WBC 6.7  HGB 14.9  HCT 42.3  PLT 316    Chemistries   Recent Labs Lab 11/09/16 1653 11/10/16 0349  NA 141  --   K 4.2  --   CL 105  --   CO2 30  --   GLUCOSE 122*  --   BUN 9  --   CREATININE 1.11* 1.05*  CALCIUM 9.8  --     Cardiac Enzymes  Recent Labs Lab 11/10/16 0349  TROPONINI <0.03    Microbiology Results  Results for orders placed or performed during the hospital encounter of 06/06/16  Blood culture (routine x 2)     Status: None   Collection Time: 06/06/16  4:00 PM  Result Value Ref Range Status   Specimen Description BLOOD RIGHT ASSIST CONTROL  Final   Special Requests BOTTLES DRAWN AEROBIC AND ANAEROBIC 10CC  Final   Culture NO GROWTH 5 DAYS  Final   Report Status 06/11/2016 FINAL  Final  Blood culture (routine x 2)     Status: None   Collection Time: 06/06/16  4:00 PM  Result Value Ref Range Status   Specimen Description BLOOD LEFT ARM  Final   Special Requests   Final    BOTTLES DRAWN AEROBIC AND ANAEROBIC 10CC AER 8CC ANA   Culture NO GROWTH 5 DAYS  Final   Report Status 06/11/2016 FINAL  Final    RADIOLOGY:  Dg Chest 2 View  Result Date: 11/09/2016 CLINICAL DATA:  Chest pain. EXAM: CHEST  2 VIEW COMPARISON:  Radiographs of June 06, 2016. FINDINGS: The heart size and mediastinal contours are within normal limits. Both lungs are clear. No pneumothorax or pleural effusion is noted. The visualized skeletal structures are unremarkable. IMPRESSION: No active cardiopulmonary disease. Electronically Signed   By: Lupita RaiderJames  Green Jr, M.D.   On: 11/09/2016 17:37    EKG:   Orders placed or performed during the hospital encounter of 11/09/16  .  EKG 12-Lead  . EKG 12-Lead  . ED EKG within 10 minutes  . ED EKG within 10 minutes      Management plans discussed with the patient, family and they are in agreement.  CODE STATUS:     Code Status Orders        Start     Ordered   11/09/16 1909  Full code  Continuous     11/09/16 1909    Code Status History    Date Active Date Inactive Code Status Order ID Comments User Context   06/06/2016  8:15 PM 06/07/2016  8:26 AM Full Code [id number]  Enid Baasadhika Kalisetti, MD ED   03/26/2016 11:27 PM 03/29/2016  5:29 PM Full Code [id number]  Audery AmelJohn T Clapacs, MD Inpatient   03/20/2015  8:04 PM 03/21/2015 12:14 AM Full Code [id number]  Altamese DillingVaibhavkumar Vachhani, MD ED      TOTAL TIME TAKING CARE OF THIS PATIENT: 40 minutes.    Katharina CaperVAICKUTE,Zachari Alberta M.D on 11/10/2016 at 2:36 PM  Between 7am to 6pm - Pager - [phone]  After 6pm go to [email or web address] - password [email or web address] [facility]  [facility]  Office  [phone]  CC: Primary care physician; Marikay AlarEric Sonnenberg, MD

## 2016-11-10 NOTE — Progress Notes (Signed)
Pt arrived from ED alert and oriented. No c/o pain, no SOB. Telemetry box verified, skin verified with second RN . No concerns offered.

## 2016-11-10 NOTE — Care Management (Signed)
Rebecca RegalCarol was in a decent mood and did not desire anything, while performing rounds today I will make sure I check on her again.

## 2016-11-10 NOTE — Care Management Obs Status (Signed)
MEDICARE OBSERVATION STATUS NOTIFICATION   Patient Details  Name: Rebecca Ramos MRN: [id number] Date of Birth: 07/24/1961   Medicare Observation Status Notification Given:  Yes    Eber HongGreene, Brydon Spahr R, RN 11/10/2016, 12:04 PM

## 2016-11-10 NOTE — Consult Note (Signed)
Cardiology Consultation Note  Patient ID: Rebecca Ramos, MRN: [id number], DOB/AGE: 1961/01/20 55 y.o. Admit date: 11/09/2016   Date of Consult: 11/10/2016 Primary Physician: Marikay Alar, MD Primary Cardiologist: Dr. Okey Dupre, MD Requesting Physician: Dr. Elpidio Anis, MD  Chief Complaint: Atypical chest pain/palpitations Reason for Consult: Same  HPI: 55 y.o. female with h/o palpitations with recently noted 4 beats of SVT on Zio patch monitor along with rare supraventricular and ventricular ectopy. Also with history of atypical chest pain, cocaine abuse, HTN, HLD, chronic pain syndrome, COPD, GERD, and PE who was admitted on 12/27 with atypical chest pain and palpitations.   She by Dr. Okey Dupre on 11/21 for evaluation of her palpitations occurring 4-5 times per week lasting several seconds then self resolving and chest pain at rest, mostly associated with her palpitations. Work up in 05/2016 with echo showed EF of 60-65%, mild LVH, GR1DD, mild left atrial enlargement, normal RV size and function, no significant valvular abnormalities. Nuclear stress test in 05/2016 showed no significant ischemia, LVEF 44% felt to be 2/2 GI uptake, low risk study. Zio monitor in 09/2016 showed rare supraventricular and ventricular ectopy with a 4 beat run of SVT. She was started on Lopressor 12.5 mg bid without help in her palpitations. This was increased after discussing with her pharmacist to 25 mg bid. She still has not noted any relief in her palpitations. Patient called the office on 12/27 with chest pain radiating to her left arm that was associated with palpitations. Chest pain was nonexertional and located along her left breast. No associated nausea, vomiting, diaphoresis, dizziness, presyncope, or syncope. Associated palpitations that would last 2-3 seconds and self resolve, this was the same duration of her chest pain. She had been seen by her PCP that day and 12-lead EKG was reviewed that appeared to show slight worsening  of st depression in anterior leads. She was advised to proceed to the ED based on that.   Upon the patient's arrival to [facility] they were found to have negative troponin x 2, urine drug screen negative for cocaine but positive for tricyclics and benzos. Unremarkable cbc, SCr 1.11, K+ 4.2. ECG in the ED appeared improved from her outpatient study as below. CXR showed no active cardiopulmonary disease. She reported palpitations at 4:30 AM that did not correlate with any significant arrhythmia seen on telemetry. This was not associated with chest pain. She is currently without chest pain unless she presses on her chest.   Past Medical History:  Diagnosis Date  . Acid reflux   . Anxiety   . Chronic lower back pain    osteoarthritis in luber spine  . COPD (chronic obstructive pulmonary disease) (HCC)   . Depression    Bipolar  . Heart murmur   . History of degenerative disc disease   . History of pulmonary embolus (PE)   . HTN (hypertension) 03/20/2015  . Hypertension   . Osteoarthritis of lumbosacral spine   . Right Achilles tendinitis   . Thrombus    right lung in 2015, off anticoagulation now  . Transaminitis       Most Recent Cardiac Studies: Non-Invasive Evaluation(s):  Transthoracic echocardiogram (06/07/16): Normal LV size and contraction (EF 60-65%) with mild LVH and grade 1 diastolic dysfunction.  Mild left atrial enlargement.  Normal RV size and function.  No significant valvular abnormalities.  Trivial pericardial effusion.  Pharmacologic myocardial perfusion stress test (06/07/16): No significant ischemia.  LVEF 44% (depressed EF likely secondary to GI uptake artifact).  Low  risk study.   Surgical History:  Past Surgical History:  Procedure Laterality Date  . ABDOMINAL HYSTERECTOMY    . CESAREAN SECTION Bilateral   . CESAREAN SECTION Bilateral   . ELBOW SURGERY     bilateral  . thumb surgery     right   . TONSILLECTOMY       Home Meds: Prior to Admission medications     Medication Sig Start Date End Date Taking? Authorizing Provider  albuterol (PROVENTIL HFA;VENTOLIN HFA) 108 (90 Base) MCG/ACT inhaler Inhale 1 puff into the lungs every 4 (four) hours. 11/18/15 11/17/16 Yes Historical Provider, MD  ALPRAZolam Prudy Feeler) 1 MG tablet Take 1 mg by mouth 4 (four) times daily.  05/18/16  Yes Historical Provider, MD  amLODipine (NORVASC) 5 MG tablet TAKE 1 TABLET BY MOUTH EVERY DAY 07/11/16  Yes Glori Luis, MD  atorvastatin (LIPITOR) 20 MG tablet Take 1 tablet (20 mg total) by mouth daily. 06/10/16  Yes Amy Rusty Aus, NP  budesonide-formoterol (SYMBICORT) 160-4.5 MCG/ACT inhaler Inhale 2 puffs into the lungs 2 (two) times daily. 09/27/16 09/27/17 Yes Glori Luis, MD  COMBIVENT RESPIMAT 20-100 MCG/ACT AERS respimat Inhale 1 puff into the lungs 4 (four) times daily as needed. For wheezing. 06/10/16  Yes Amy Rusty Aus, NP  cyanocobalamin (,VITAMIN B-12,) 1000 MCG/ML injection Inject into the muscle. 01/13/16  Yes Historical Provider, MD  fluticasone (FLONASE) 50 MCG/ACT nasal spray Place 2 sprays into both nostrils daily as needed for rhinitis.    Yes Historical Provider, MD  gabapentin (NEURONTIN) 300 MG capsule Take 300 mg by mouth 3 (three) times daily. 03/18/16  Yes Historical Provider, MD  hydrOXYzine (ATARAX/VISTARIL) 50 MG tablet Take 50 mg by mouth 4 (four) times daily as needed.   Yes Historical Provider, MD  lisinopril (PRINIVIL,ZESTRIL) 10 MG tablet Take 10 mg by mouth daily.   Yes Historical Provider, MD  metoprolol tartrate (LOPRESSOR) 25 MG tablet Take 0.5 tablets (12.5 mg total) by mouth 2 (two) times daily. 11/01/16  Yes Yvonne Kendall, MD  omega-3 acid ethyl esters (LOVAZA) 1 g capsule  06/17/16  Yes Historical Provider, MD  ondansetron (ZOFRAN) 4 MG tablet Take 1 tablet (4 mg total) by mouth every 8 (eight) hours as needed for nausea or vomiting. 09/27/16  Yes Glori Luis, MD  polyethylene glycol powder (GLYCOLAX/MIRALAX) powder Take 17 g by mouth  daily. 10/21/16  Yes Glori Luis, MD  ranitidine (ZANTAC) 150 MG tablet Take 150 mg by mouth 2 (two) times daily as needed for heartburn.   Yes Historical Provider, MD  traMADol (ULTRAM) 50 MG tablet TAKE 1 TABLET BY MOUTH EVERY 8 HOURS AS NEEDED FOR MODERATE OR SEVERE PAIN 11/04/16  Yes Glori Luis, MD  Vitamin D, Ergocalciferol, (DRISDOL) 50000 units CAPS capsule Take 50,000 Units by mouth once a week. monday 03/16/16  Yes Historical Provider, MD  zolpidem (AMBIEN) 10 MG tablet Take 10 mg by mouth at bedtime.  06/20/16  Yes Historical Provider, MD  Blood Pressure Monitoring (BLOOD PRESSURE CUFF) MISC 1 each by Does not apply route daily. 06/16/16   Amy Rusty Aus, NP  esomeprazole (NEXIUM) 20 MG capsule Take 1 capsule (20 mg total) by mouth 2 (two) times daily before a meal. Patient not taking: Reported on 11/09/2016 09/27/16   Glori Luis, MD  furosemide (LASIX) 20 MG tablet TAKE 1 TABLET(20 MG) BY MOUTH DAILY Patient not taking: Reported on 11/09/2016 09/30/16   Glori Luis, MD  ipratropium-albuterol (DUONEB) 0.5-2.5 (  3) MG/3ML SOLN Take 3 mLs by nebulization every 4 (four) hours as needed. Patient not taking: Reported on 11/09/2016 06/08/16   Katharina Caperima Vaickute, MD    Inpatient Medications:  . ALPRAZolam  1 mg Oral QID  . atorvastatin  20 mg Oral Daily  . enoxaparin (LOVENOX) injection  40 mg Subcutaneous Q12H  . famotidine  20 mg Oral BID  . gabapentin  300 mg Oral TID  . guaiFENesin  600 mg Oral BID  . lisinopril  10 mg Oral Daily  . metoprolol tartrate  25 mg Oral BID  . mometasone-formoterol  2 puff Inhalation BID  . polyethylene glycol  17 g Oral Daily  . sodium chloride flush  3 mL Intravenous Q12H  . sodium chloride flush  3 mL Intravenous Q12H  . zolpidem  5 mg Oral QHS     Allergies:  Allergies  Allergen Reactions  . Honey Bee Venom Anaphylaxis  . Sulfacetamide Sodium Hives and Swelling  . Levofloxacin Nausea Only and Rash  . Sulfa Antibiotics Hives and  Swelling  . Codeine Rash    Social History   Social History  . Marital status: Single    Spouse name: N/A  . Number of children: N/A  . Years of education: N/A   Occupational History  . Not on file.   Social History Main Topics  . Smoking status: Never Smoker  . Smokeless tobacco: Never Used  . Alcohol use No  . Drug use: No     Comment: Patient denies, but per previous H&P from Psych- cocaine use in the past  . Sexual activity: Yes    Birth control/ protection: Surgical   Other Topics Concern  . Not on file   Social History Narrative  . No narrative on file     Family History  Problem Relation Age of Onset  . CAD Mother   . Heart disease Mother     Pacemaker  . CAD Sister   . Heart disease Sister     Heart surgery x 1  . Throat cancer Brother   . CAD Maternal Grandmother   . CAD Maternal Grandfather      Review of Systems: Review of Systems  Constitutional: Positive for malaise/fatigue. Negative for chills, diaphoresis, fever and weight loss.  HENT: Negative for congestion.   Eyes: Negative for discharge and redness.  Respiratory: Negative for cough, hemoptysis, sputum production, shortness of breath and wheezing.   Cardiovascular: Positive for chest pain and palpitations. Negative for orthopnea, claudication, leg swelling and PND.  Gastrointestinal: Negative for abdominal pain, blood in stool, heartburn, melena, nausea and vomiting.  Genitourinary: Negative for hematuria.  Musculoskeletal: Negative for falls and myalgias.  Skin: Negative for rash.  Neurological: Positive for weakness and headaches. Negative for dizziness, tingling, tremors, sensory change, speech change, focal weakness and loss of consciousness.  Endo/Heme/Allergies: Does not bruise/bleed easily.  Psychiatric/Behavioral: Positive for substance abuse. The patient is nervous/anxious.   All other systems reviewed and are negative.   Labs:  Recent Labs  11/09/16 1653 11/09/16 2215  11/10/16 0349  TROPONINI <0.03 <0.03 <0.03   Lab Results  Component Value Date   WBC 6.7 11/09/2016   HGB 14.9 11/09/2016   HCT 42.3 11/09/2016   MCV 88.6 11/09/2016   PLT 316 11/09/2016     Recent Labs Lab 11/09/16 1653  NA 141  K 4.2  CL 105  CO2 30  BUN 9  CREATININE 1.11*  CALCIUM 9.8  GLUCOSE 122*   Lab  Results  Component Value Date   CHOL 231 (H) 06/06/2016   HDL 43 06/06/2016   LDLCALC 142 (H) 06/06/2016   TRIG 231 (H) 06/06/2016   No results found for: DDIMER  Radiology/Studies:  Dg Chest 2 View  Result Date: 11/09/2016 CLINICAL DATA:  Chest pain. EXAM: CHEST  2 VIEW COMPARISON:  Radiographs of June 06, 2016. FINDINGS: The heart size and mediastinal contours are within normal limits. Both lungs are clear. No pneumothorax or pleural effusion is noted. The visualized skeletal structures are unremarkable. IMPRESSION: No active cardiopulmonary disease. Electronically Signed   By: Lupita Raider, M.D.   On: 11/09/2016 17:37   Ct Abdomen Pelvis W Contrast  Result Date: 10/20/2016 CLINICAL DATA:  Left upper quadrant pain for 6 months. EXAM: CT ABDOMEN AND PELVIS WITH CONTRAST TECHNIQUE: Multidetector CT imaging of the abdomen and pelvis was performed using the standard protocol following bolus administration of intravenous contrast. CONTRAST:  100 cc Isovue 370 IV COMPARISON:  10/04/2014 FINDINGS: Lower chest: Lung bases are clear. No effusions. Heart is normal size. Hepatobiliary: Diffuse fatty infiltration of the liver. No focal abnormality. Gallbladder unremarkable. Pancreas: No focal abnormality or ductal dilatation. Spleen: No focal abnormality.  Normal size. Adrenals/Urinary Tract: No adrenal abnormality. No focal renal abnormality. No stones or hydronephrosis. Urinary bladder is unremarkable. Stomach/Bowel: Moderate stool in the colon, particularly ascending colon, transverse colon and descending colon. Appendix is normal. Vascular/Lymphatic: No evidence of aneurysm  or adenopathy. Reproductive: Prior hysterectomy.  No adnexal masses. Other: No free fluid or free air. Musculoskeletal: No acute bony abnormality or focal bone lesion. IMPRESSION: Moderate stool burden in the colon. Fatty liver. No acute findings in the abdomen or pelvis. These results will be called to the ordering clinician or representative by the Radiologist Assistant, and communication documented in the PACS or zVision Dashboard. Electronically Signed   By: Charlett Nose M.D.   On: 10/20/2016 15:16    EKG: Interpreted by me showed: NSR, 61 bpm, incomplete RBBB, poor R wave progression Telemetry: Interpreted by me showed: sinus rhythm to sinus bradycardia with heart rates into the upper 50's to 60's bpm. No significant arrhythmia seen.   Weights: Filed Weights   11/09/16 1653 11/09/16 2026  Weight: 228 lb (103.4 kg) 225 lb 3.2 oz (102.2 kg)     Physical Exam: Blood pressure (!) 99/48, pulse (!) 53, temperature 97.4 F (36.3 C), temperature source Oral, resp. rate 18, weight 225 lb 3.2 oz (102.2 kg), SpO2 97 %. Body mass index is 41.19 kg/m. General: Well developed, well nourished, in no acute distress. Head: Normocephalic, atraumatic, sclera non-icteric, no xanthomas, nares are without discharge.  Neck: Negative for carotid bruits. JVD not elevated. Lungs: Clear bilaterally to auscultation without wheezes, rales, or rhonchi. Breathing is unlabored. Heart: RRR with S1 S2. No murmurs, rubs, or gallops appreciated. Chest pain is fully reproducible to palpation on exam.  Abdomen: Obese, soft, non-tender, non-distended with normoactive bowel sounds. No hepatomegaly. No rebound/guarding. No obvious abdominal masses. Msk:  Strength and tone appear normal for age. Extremities: No clubbing or cyanosis. No edema. Distal pedal pulses are 2+ and equal bilaterally. Neuro: Alert and oriented X 3. No facial asymmetry. No focal deficit. Moves all extremities spontaneously. Psych:  Responds to questions  appropriately with a normal affect.    Assessment and Plan:  Principal Problem:   Atypical chest pain Active Problems:   Chronic pain syndrome   HTN (hypertension)   Cocaine use disorder, moderate, dependence (HCC)   COPD (  chronic obstructive pulmonary disease) (HCC)   Palpitations   Headache    1. Atypical chest pain: -Pain is reproducible to palpation on exam -Troponin negative -EKG in the EKG looks better from outpatient 12-lead -Associated with palpitations, otherwise without pain -Noted palpitations at 4:30 AM, telemetry reviewed and does no correlate with significant arrhythmia -Consider non-cardiac etiology of her pain  2. Palpitations: -Prior Zio monitor with rare ventricular and supraventricular ectopy and a 4 beat run of SVT -She has already increased to Lopressor to 25 mg bid at the request of her pharmacist -BP and baseline heart rate preclude further titration of Lopressor -Palpitations do not correlate with evidence of significant ectopy on telemetry  -Consider anxiety -Continue Lopressor as BP allows, may need to decrease dose given heart rate and BP  3. Chronic pain syndrome/headache: -Notes a headache today and is requesting pain medication -Has tramadol ordered, she reports this does not help and requests stronger pain medication -Will give one-time dose of morphine -Per IM  4. History of cocaine abuse: -UDS negative at this time -Cautious use of beta blocker  5. HTN: -BP soft at this time -Hold lisinopril this morning  6. COPD: -Appears stable    Signed, Eula ListenRyan Milia Warth, PA-C [facility] Pager: [phone] 11/10/2016, 8:10 AM

## 2016-11-10 NOTE — Progress Notes (Signed)
Patient is discharge in a stable condition, summary and f/u care given verbalized understanding .

## 2016-11-10 NOTE — Telephone Encounter (Signed)
Pt called and stated that she went to the ED yesterday and they gave her some medication. She needs a refill on Dulera inhaler and furiscet. Pt's bp was also running low while she was admitted, the doctors took her off lisinopril (PRINIVIL,ZESTRIL) 10 MG tablet, and amLODipine (NORVASC) 5 MG tablet until her bp goes. Up .Please advise, thank you!  Call pt @ [phone]  Pharmacy - [facility] [phone] [place] [address] [facility] [address] [facility]

## 2016-11-11 NOTE — Telephone Encounter (Signed)
Attempted to contact the patient. A female answered the phone and went to get the patient though she was reportedly asleep. I advised that she should contact her doctors office next week. I reviewed her chart.  When she calls back please let her know the following. It appears that she was on dulera in the hospital as a formulary alternative as they likely do not carry symbicort. She should continue on the symbicort. She will need a follow-up to discuss her headaches. She has not been placed on celexa through our office and it does not appear that this has been prescribed to her through our system. It appears that her last prozac refill was in May 2017 for 30 days. I am unsure where she has been getting these medications filled since May, though she should be on only one of the following, celexa, prozac, or zoloft, not all 3. Please try to clarify with the patient on these medications. Thanks.

## 2016-11-11 NOTE — Telephone Encounter (Signed)
It appears that they discontinued the amlodipine though there is no mention of discontinuing the lisinopril. Please confirm with the patient if she is on Symbicort or Dulera. When we discussed this previously she was on Symbicort. To my knowledge she is not on Fioricet. Please see if she means fluticasone or furosemide. Thanks.

## 2016-11-11 NOTE — Telephone Encounter (Signed)
Please advise Meds are not the same as current med list, I don't see the two stopped meds and inhalers are different.  Thanks

## 2016-11-11 NOTE — Telephone Encounter (Signed)
Spoke with the patient, she told me that she is currently on the Symbicort and [facility] She was given oxycodone in the hospital for her headaches and their suggestion was to take a medication like Fioricet for the headaches instead and wanted to have her follow up with you to see if you would prescribe it.  She is getting the headaches from the metoprolol which gives her headaches, but cardiology told her she needs to continue taking it regardless.  Last question is regarding her psych medications.  She was currently on Prozac and our office added Celexa.  Then the hospital added Zoloft.  She is concerned that this is too much of the med's and wants some advice on what to do.  She has an upcoming appt with psych next week.  Please advise, thansk

## 2016-11-15 NOTE — Telephone Encounter (Signed)
Can you please try to call patient, thanks

## 2016-11-16 NOTE — Telephone Encounter (Signed)
Left message to call.

## 2016-11-17 NOTE — Progress Notes (Deleted)
Follow-up Outpatient Visit Date: 11/18/2016  Referring Provider: Leone Haven, MD [address] [place], [place] [address]  Chief Complaint: Palpitations  HPI:  Ms. Theall is a 56 y.o. year-old female with history of hypertension, hyperlipidemia, heart murmur, COPD, GERD, and pulmonary embolism, who returns for follow-up of palpitations.  I first met the patient on 10/04/16, at which time she was most concerned about palpitations.  We obtained a 14-day event monitor, which showed rare PACs and PVCs, as well as a single brief episode of SVT (4 beats).  We agreed to start low-dose beta-blocker.  The patient presented to her PCP on 11/09/16 and noted continued palpitations with accompanying chest tightness that ultimately led to overnight hospitalization for chest pain rule-out.  Workup was unremarkable, and the patient was discharged on higher dose of metoprolol.   --------------------------------------------------------------------------------------------------  Cardiovascular History & Procedures: Cardiovascular Problems:  Palpitations  Risk Factors:  Hypertension, hyperlipidemia, diabetes mellitus, and ? Family history  Cath/PCI:  None  CV Surgery:  None  EP Procedures and Devices:  14-day event monitor (10/27/16): Predominantly sinus rhythm (range 47-120 bpm, average 67 bpm).  Rare isolated PACs and PVCs.  Single episode of SVT lasting 4 beats (max rate 111 bpm).  No sustained arrhythmias or prolonged pauses.  Non-Invasive Evaluation(s):  Transthoracic echocardiogram (06/07/16): Normal LV size and contraction (EF 60-65%) with mild LVH and grade 1 diastolic dysfunction.  Mild left atrial enlargement.  Normal RV size and function.  No significant valvular abnormalities.  Trivial pericardial effusion.  Pharmacologic myocardial perfusion stress test (06/07/16): No significant ischemia.  LVEF 44% (depressed EF likely secondary to GI uptake artifact).  Low risk  study.  Recent CV Pertinent Labs: Lab Results  Component Value Date   CHOL 231 (H) 06/06/2016   HDL 43 06/06/2016   LDLCALC 142 (H) 06/06/2016   TRIG 231 (H) 06/06/2016   CHOLHDL 5.4 06/06/2016   INR 1.0 09/19/2014   BNP 9.0 06/06/2016   K 4.2 11/09/2016   K 3.6 10/04/2014   MG 2.1 09/20/2014   BUN 9 11/09/2016   BUN 11 10/04/2014   CREATININE 1.05 (H) 11/10/2016   CREATININE 0.94 10/04/2014   --------------------------------------------------------------------------------------------------   Past Medical History:  Diagnosis Date  . Acid reflux   . Anxiety   . Chronic lower back pain    osteoarthritis in luber spine  . COPD (chronic obstructive pulmonary disease) ([place])   . Depression    Bipolar  . Heart murmur   . History of degenerative disc disease   . History of pulmonary embolus (PE)   . HTN (hypertension) 03/20/2015  . Hypertension   . Osteoarthritis of lumbosacral spine   . Right Achilles tendinitis   . Thrombus    right lung in 2015, off anticoagulation now  . Transaminitis     Past Surgical History:  Procedure Laterality Date  . ABDOMINAL HYSTERECTOMY    . CESAREAN SECTION Bilateral   . CESAREAN SECTION Bilateral   . ELBOW SURGERY     bilateral  . thumb surgery     right   . TONSILLECTOMY      Outpatient Encounter Prescriptions as of 11/18/2016  Medication Sig  . albuterol (PROVENTIL HFA;VENTOLIN HFA) 108 (90 Base) MCG/ACT inhaler Inhale 1 puff into the lungs every 4 (four) hours.  . ALPRAZolam (XANAX) 1 MG tablet Take 1 mg by mouth 4 (four) times daily.   Marland Kitchen amLODipine (NORVASC) 5 MG tablet TAKE 1 TABLET BY MOUTH EVERY DAY  .  atorvastatin (LIPITOR) 20 MG tablet Take 1 tablet (20 mg total) by mouth daily.  . Blood Pressure Monitoring (BLOOD PRESSURE CUFF) MISC 1 each by Does not apply route daily.  . budesonide-formoterol (SYMBICORT) 160-4.5 MCG/ACT inhaler Inhale 2 puffs into the lungs 2 (two) times daily.  . COMBIVENT RESPIMAT 20-100 MCG/ACT AERS  respimat Inhale 1 puff into the lungs 4 (four) times daily as needed. For wheezing.  . cyanocobalamin (,VITAMIN B-12,) 1000 MCG/ML injection Inject into the muscle.  . esomeprazole (NEXIUM) 20 MG capsule Take 1 capsule (20 mg total) by mouth 2 (two) times daily before a meal. (Patient not taking: Reported on 11/09/2016)  . fluticasone (FLONASE) 50 MCG/ACT nasal spray Place 2 sprays into both nostrils daily as needed for rhinitis.   . furosemide (LASIX) 20 MG tablet TAKE 1 TABLET(20 MG) BY MOUTH DAILY (Patient not taking: Reported on 11/09/2016)  . gabapentin (NEURONTIN) 300 MG capsule Take 300 mg by mouth 3 (three) times daily.  . hydrOXYzine (ATARAX/VISTARIL) 50 MG tablet Take 50 mg by mouth 4 (four) times daily as needed.  Marland Kitchen ipratropium-albuterol (DUONEB) 0.5-2.5 (3) MG/3ML SOLN Take 3 mLs by nebulization every 4 (four) hours as needed. (Patient not taking: Reported on 11/09/2016)  . lisinopril (PRINIVIL,ZESTRIL) 10 MG tablet Take 10 mg by mouth daily.  . metoprolol tartrate (LOPRESSOR) 25 MG tablet Take 1 tablet (25 mg total) by mouth 2 (two) times daily.  Marland Kitchen omega-3 acid ethyl esters (LOVAZA) 1 g capsule   . ondansetron (ZOFRAN) 4 MG tablet Take 1 tablet (4 mg total) by mouth every 8 (eight) hours as needed for nausea or vomiting.  . polyethylene glycol powder (GLYCOLAX/MIRALAX) powder Take 17 g by mouth daily.  . ranitidine (ZANTAC) 150 MG tablet Take 150 mg by mouth 2 (two) times daily as needed for heartburn.  . sertraline (ZOLOFT) 25 MG tablet Take 1 tablet (25 mg total) by mouth daily.  . traMADol (ULTRAM) 50 MG tablet TAKE 1 TABLET BY MOUTH EVERY 8 HOURS AS NEEDED FOR MODERATE OR SEVERE PAIN  . Vitamin D, Ergocalciferol, (DRISDOL) 50000 units CAPS capsule Take 50,000 Units by mouth once a week. monday  . zolpidem (AMBIEN) 10 MG tablet Take 10 mg by mouth at bedtime.    Facility-Administered Encounter Medications as of 11/18/2016  Medication  . guaiFENesin (MUCINEX) 12 hr tablet 600 mg     Allergies: Honey bee venom; Sulfacetamide sodium; Levofloxacin; Sulfa antibiotics; and Codeine  Social History   Social History  . Marital status: Single    Spouse name: N/A  . Number of children: N/A  . Years of education: N/A   Occupational History  . Not on file.   Social History Main Topics  . Smoking status: Never Smoker  . Smokeless tobacco: Never Used  . Alcohol use No  . Drug use: No     Comment: Patient denies, but per previous H&P from Psych- cocaine use in the past  . Sexual activity: Yes    Birth control/ protection: Surgical   Other Topics Concern  . Not on file   Social History Narrative  . No narrative on file    Family History  Problem Relation Age of Onset  . CAD Mother   . Heart disease Mother     Pacemaker  . CAD Sister   . Heart disease Sister     Heart surgery x 1  . Throat cancer Brother   . CAD Maternal Grandmother   . CAD Maternal Grandfather  Review of Systems: A 12-system review of systems was performed and was negative except as noted in the HPI.  --------------------------------------------------------------------------------------------------  Physical Exam: There were no vitals taken for this visit.  General:  Morbidly obese woman, seated comfortably on the exam table. HEENT: No conjunctival pallor or scleral icterus.  Moist mucous membranes.  OP clear.  Poor dentition. Neck: Supple without lymphadenopathy, thyromegaly, JVD, or HJR.  No carotid bruit. Lungs: Normal work of breathing.  Clear to auscultation bilaterally without wheezes or crackles. Heart: Regular rate and rhythm without murmurs, rubs, or gallops.  Non-displaced PMI. Abd: Bowel sounds present.  Soft, NT/ND.  Unable to assess hepatosplenomegaly due to body habitus. Ext: Trace pretibial edema bilaterally.  Radial, PT, and DP pulses are 2+ bilaterally Skin: warm and dry without rash Neuro: CNIII-XII intact.  Strength and fine-touch sensation intact in upper  and lower extremities bilaterally. Psych: Normal mood and affect.  EKG:  Normal sinus rhythm, borderline LVH, and left anterior fascicular block. No significant change from prior tracing on 05/22/16 (I have personally reviewed both tracings).  Lab Results  Component Value Date   WBC 6.7 11/09/2016   HGB 14.9 11/09/2016   HCT 42.3 11/09/2016   MCV 88.6 11/09/2016   PLT 316 11/09/2016    Lab Results  Component Value Date   NA 141 11/09/2016   K 4.2 11/09/2016   CL 105 11/09/2016   CO2 30 11/09/2016   BUN 9 11/09/2016   CREATININE 1.05 (H) 11/10/2016   GLUCOSE 122 (H) 11/09/2016   ALT 71 (H) 10/20/2016    Lab Results  Component Value Date   CHOL 231 (H) 06/06/2016   HDL 43 06/06/2016   LDLCALC 142 (H) 06/06/2016   TRIG 231 (H) 06/06/2016   CHOLHDL 5.4 06/06/2016    --------------------------------------------------------------------------------------------------  ASSESSMENT AND PLAN: Nelva Bush, MD 11/17/2016 8:42 AM

## 2016-11-18 ENCOUNTER — Encounter: Payer: Self-pay | Admitting: *Deleted

## 2016-11-18 ENCOUNTER — Ambulatory Visit: Payer: Self-pay | Admitting: Internal Medicine

## 2016-11-21 ENCOUNTER — Telehealth: Payer: Self-pay | Admitting: Family Medicine

## 2016-11-21 DIAGNOSIS — F331 Major depressive disorder, recurrent, moderate: Secondary | ICD-10-CM | POA: Diagnosis not present

## 2016-11-21 DIAGNOSIS — Z79899 Other long term (current) drug therapy: Secondary | ICD-10-CM | POA: Diagnosis not present

## 2016-11-21 DIAGNOSIS — F4310 Post-traumatic stress disorder, unspecified: Secondary | ICD-10-CM | POA: Diagnosis not present

## 2016-11-21 DIAGNOSIS — F410 Panic disorder [episodic paroxysmal anxiety] without agoraphobia: Secondary | ICD-10-CM | POA: Diagnosis not present

## 2016-11-21 DIAGNOSIS — R6889 Other general symptoms and signs: Secondary | ICD-10-CM | POA: Diagnosis not present

## 2016-11-21 DIAGNOSIS — F064 Anxiety disorder due to known physiological condition: Secondary | ICD-10-CM | POA: Diagnosis not present

## 2016-11-21 NOTE — Telephone Encounter (Signed)
See other message

## 2016-11-21 NOTE — Telephone Encounter (Signed)
Pt called back returning your call. Thank you!  Call pt @ [phone]

## 2016-11-21 NOTE — Telephone Encounter (Signed)
Patient takes Prozac which is prescribed by Dr.Hansen, patient notified of lab results regarding hepatitis, she states she was never notified. Patient states she would like this vaccine at her next appointment. Patient states she was put on lopressor by the hospital and was wondering if she is supposed to take her metoprolol as well, she states her blood pressure has been running low. Today it was 103/76. She states she has had headaches which she had in the hospital as well. Patient states she still has numbness in her right arm.

## 2016-11-21 NOTE — Telephone Encounter (Signed)
Left message to return call 

## 2016-11-21 NOTE — Telephone Encounter (Signed)
Please hold refill and contact patient regarding message from 10/31/16. Thanks.

## 2016-11-21 NOTE — Telephone Encounter (Signed)
Please contact patient regarding this. She requested a refill of tramadol and we need to know what medications she is taking prior to being able to refill this. Thanks.

## 2016-11-21 NOTE — Telephone Encounter (Signed)
Patient states it is her Left arm and she has been experiencing this for 3 months. Patient notified Janean Sarkultram is ready. Patient scheduled for hospital follow up 11/24/16 1115 and with cardiology 11/23/16 1130.

## 2016-11-21 NOTE — Telephone Encounter (Signed)
Last filled 11/04/16 60 0rf

## 2016-11-21 NOTE — Telephone Encounter (Signed)
Noted. We will discuss further workup at follow-up.

## 2016-11-21 NOTE — Telephone Encounter (Signed)
Noted. Please let patient know the metoprolol is the same medication as Lopressor. She should be taking 25 mg of metoprolol twice daily per their recommendations. She should no longer be taking amlodipine. I would suggest patient have a hospital follow-up appointment to evaluate her blood pressure and headaches. The patient reported the numbness on her last visit as well. At that point it had been intermittent and was in her left arm. Please determine how long she's been having the numbness in her right arm for and if she has any other symptoms with it. We will refill her tramadol as well.

## 2016-11-23 ENCOUNTER — Ambulatory Visit: Payer: Self-pay | Admitting: Internal Medicine

## 2016-11-24 ENCOUNTER — Ambulatory Visit: Payer: Self-pay | Admitting: Family Medicine

## 2016-11-28 ENCOUNTER — Telehealth: Payer: Self-pay | Admitting: Family Medicine

## 2016-11-28 NOTE — Telephone Encounter (Signed)
Noted  

## 2016-11-28 NOTE — Telephone Encounter (Signed)
Patient called [facility] over the weekend and was advised to see a Dr. In the next 4 hours, patient has schedule with cardiology for 11/29/16 Dr. Cristal Deerhristopher End. Patient complaint is heaviness and aching of left arm with a throbbing sensation and thumb goes numb at night. Patient stated this has been on going since last OV but seems to be getting worse, advised patient she should go to ER but patient stated she has no transportation and started crying that she believes it is her statin medication for cholesterol, "patient stopped X 2 days" Pain still in left arm worse with exertion such as walking relieves with rest.. Again advised patient she should call EMS or have someone take her to the ER or urgent care patient stated she would go if symptoms worsen only.  Patient also has appointment with PCP on Wednesday. FYI

## 2016-11-28 NOTE — Telephone Encounter (Signed)
Noted. Please contact patient and encouraged her to be evaluated today for this. Thanks.

## 2016-11-28 NOTE — Telephone Encounter (Signed)
Patient notified and states she will go to the ER , patient states she will call her boyfriends father and he will take her.

## 2016-11-29 ENCOUNTER — Ambulatory Visit (INDEPENDENT_AMBULATORY_CARE_PROVIDER_SITE_OTHER): Payer: Commercial Managed Care - HMO | Admitting: Internal Medicine

## 2016-11-29 ENCOUNTER — Encounter: Payer: Self-pay | Admitting: Internal Medicine

## 2016-11-29 VITALS — BP 126/90 | HR 73 | Ht 62.0 in | Wt 232.8 lb

## 2016-11-29 DIAGNOSIS — I471 Supraventricular tachycardia: Secondary | ICD-10-CM | POA: Diagnosis not present

## 2016-11-29 DIAGNOSIS — M79602 Pain in left arm: Secondary | ICD-10-CM | POA: Diagnosis not present

## 2016-11-29 DIAGNOSIS — R0789 Other chest pain: Secondary | ICD-10-CM | POA: Diagnosis not present

## 2016-11-29 DIAGNOSIS — R6889 Other general symptoms and signs: Secondary | ICD-10-CM | POA: Diagnosis not present

## 2016-11-29 DIAGNOSIS — I10 Essential (primary) hypertension: Secondary | ICD-10-CM

## 2016-11-29 MED ORDER — DILTIAZEM HCL ER COATED BEADS 180 MG PO CP24: 180.0000 mg | ORAL_CAPSULE | Freq: Every day | ORAL | 3 refills | Status: AC

## 2016-11-29 NOTE — Progress Notes (Signed)
Follow-up Outpatient Visit Date: 11/29/2016  Primary Care Provider: Marikay Alar, MD [address] [place] [address]  Chief Complaint: Follow-up palpitations  HPI:  [name] is a 56 y.o. year-old female with history of hypertension, hyperlipidemia, heart murmur, COPD, GERD, and pulmonary embolism, who presents for follow-up of palpitations. I last saw the patient on 10/04/16, at which time she reported frequent palpitations described as several rapid heart beats in succession lasting a few seconds at a time and occurring 4-5 times per week. She was most aware of the palpitations in stressful situations or when lying in bed at night. Palpitations have been accompanied by chest tightness and difficulty catching her breath. Subsequent 14-day cardiac event monitor revealed predominantly sinus rhythm with rare isolated ventricular and supraventricular ectopy. A single episode of SVT was identified lasting 4 beats. No sustained arrhythmias or prolonged pauses were seen. We agreed to start metoprolol tartrate 12.5 mg twice a day.  Since that time, the patient was hospitalized for atypical chest pain late last month. EKG showed sinus rhythm with left anterior fascicular block and possible LVH, similar to prior tracing. Serial troponins were negative. Metoprolol was increased to 25 mg twice a day. Today, the patient reports that it took several days for her chest pain to ultimately resolved. She noted that it was worse with deep inspiration and unrelated to other activities. She has stable exertional dyspnea and 2-3 pillow orthopnea. She denies lower extremity edema.  [name] has continued to have palpitations. Overall, they seem to be increasing in frequency now happening several times a day. They typically last anywhere from a few seconds to a minute. She notes accompanying nausea and occasional lightheadedness. She has not passed out. She does not believe that the metoprolol is  helping much. However, she experiences a few hours of relief after taking alprazolam. She is cut down her soda consumption to one per day. However, she is using approximately 3 packs of BC powder per day for headaches and other pain. She notes that she has gained about 50 pounds over the last 6 months, though she does not believed that her diet or activity have changed much.  --------------------------------------------------------------------------------------------------  Cardiovascular History & Procedures: Cardiovascular Problems:  Paroxysmal supraventricular tachycardia  Risk Factors:  Hypertension, hyperlipidemia, diabetes mellitus, obesity, and ? family history  Cath/PCI:  None  CV Surgery:  None  EP Procedures and Devices:  14-day event monitor (10/27/16): Predominant rhythm was sinus with average rate of 67 bpm (range 47-120 bpm). Rare isolated ventricular and supraventricular ectopy was observed. There was a single episode of SVT lasting 4 beats with a maximum rate of 111 bpm. No sustained arrhythmias or prolonged pauses were identified.  Non-Invasive Evaluation(s):  Transthoracic echocardiogram (06/07/16): Normal LV size and contraction (EF 60-65%) with mild LVH and grade 1 diastolic dysfunction.  Mild left atrial enlargement.  Normal RV size and function.  No significant valvular abnormalities.  Trivial pericardial effusion.  Pharmacologic myocardial perfusion stress test (06/07/16): No significant ischemia.  LVEF 44% (depressed EF likely secondary to GI uptake artifact).  Low risk study.  Recent CV Pertinent Labs: Lab Results  Component Value Date   CHOL 231 (H) 06/06/2016   HDL 43 06/06/2016   LDLCALC 142 (H) 06/06/2016   TRIG 231 (H) 06/06/2016   CHOLHDL 5.4 06/06/2016   INR 1.0 09/19/2014   BNP 9.0 06/06/2016   K 4.2 11/09/2016   K 3.6 10/04/2014   MG 2.1 09/20/2014   BUN 9  11/09/2016   BUN 11 10/04/2014   CREATININE 1.05 (H) 11/10/2016   CREATININE 0.94  10/04/2014    Past medical and surgical history were reviewed and updated in EPIC.  Outpatient Encounter Prescriptions as of 11/29/2016  Medication Sig  . ALPRAZolam (XANAX) 1 MG tablet Take 1 mg by mouth 4 (four) times daily.   Marland Kitchen atorvastatin (LIPITOR) 20 MG tablet Take 1 tablet (20 mg total) by mouth daily.  . Blood Pressure Monitoring (BLOOD PRESSURE CUFF) MISC 1 each by Does not apply route daily.  . budesonide-formoterol (SYMBICORT) 160-4.5 MCG/ACT inhaler Inhale 2 puffs into the lungs 2 (two) times daily.  . COMBIVENT RESPIMAT 20-100 MCG/ACT AERS respimat Inhale 1 puff into the lungs 4 (four) times daily as needed. For wheezing.  . cyanocobalamin (,VITAMIN B-12,) 1000 MCG/ML injection Inject into the muscle.  . esomeprazole (NEXIUM) 20 MG capsule Take 1 capsule (20 mg total) by mouth 2 (two) times daily before a meal.  . fluticasone (FLONASE) 50 MCG/ACT nasal spray Place 2 sprays into both nostrils daily as needed for rhinitis.   . furosemide (LASIX) 20 MG tablet TAKE 1 TABLET(20 MG) BY MOUTH DAILY  . gabapentin (NEURONTIN) 300 MG capsule Take 300 mg by mouth 3 (three) times daily.  . hydrOXYzine (ATARAX/VISTARIL) 50 MG tablet Take 50 mg by mouth 4 (four) times daily as needed.  Marland Kitchen ipratropium-albuterol (DUONEB) 0.5-2.5 (3) MG/3ML SOLN Take 3 mLs by nebulization every 4 (four) hours as needed.  Marland Kitchen lisinopril (PRINIVIL,ZESTRIL) 10 MG tablet Take 10 mg by mouth daily.  . metoprolol tartrate (LOPRESSOR) 25 MG tablet Take 1 tablet (25 mg total) by mouth 2 (two) times daily.  Marland Kitchen omega-3 acid ethyl esters (LOVAZA) 1 g capsule   . ondansetron (ZOFRAN) 4 MG tablet Take 1 tablet (4 mg total) by mouth every 8 (eight) hours as needed for nausea or vomiting.  . polyethylene glycol powder (GLYCOLAX/MIRALAX) powder Take 17 g by mouth daily.  . ranitidine (ZANTAC) 150 MG tablet Take 150 mg by mouth 2 (two) times daily as needed for heartburn.  . sertraline (ZOLOFT) 25 MG tablet Take 1 tablet (25 mg  total) by mouth daily.  . traMADol (ULTRAM) 50 MG tablet TAKE 1 TABLET BY MOUTH EVERY 8 HOURS AS NEEDED FOR MODERATE TO SEVERE PAIN  . Vitamin D, Ergocalciferol, (DRISDOL) 50000 units CAPS capsule Take 50,000 Units by mouth once a week. monday  . zolpidem (AMBIEN) 10 MG tablet Take 10 mg by mouth at bedtime.    Facility-Administered Encounter Medications as of 11/29/2016  Medication  . guaiFENesin (MUCINEX) 12 hr tablet 600 mg    Allergies: Honey bee venom; Sulfacetamide sodium; Levofloxacin; Sulfa antibiotics; and Codeine  Social History   Social History  . Marital status: Single    Spouse name: N/A  . Number of children: N/A  . Years of education: N/A   Occupational History  . Not on file.   Social History Main Topics  . Smoking status: Never Smoker  . Smokeless tobacco: Never Used  . Alcohol use No  . Drug use: No     Comment: Patient denies, but per previous H&P from Psych- cocaine use in the past  . Sexual activity: Yes    Birth control/ protection: Surgical   Other Topics Concern  . Not on file   Social History Narrative  . No narrative on file    Family History  Problem Relation Age of Onset  . CAD Mother   . Heart disease Mother  Pacemaker  . CAD Sister   . Heart disease Sister     Heart surgery x 1  . Throat cancer Brother   . CAD Maternal Grandmother   . CAD Maternal Grandfather     Review of Systems: A 12-system review of systems was performed and was negative except as noted in the HPI.  --------------------------------------------------------------------------------------------------  Physical Exam: BP 126/90 (BP Location: Left Arm, Patient Position: Sitting, Cuff Size: Normal)   Pulse 73   Ht 5\' 2"  (1.575 m)   Wt 232 lb 12 oz (105.6 kg)   BMI 42.57 kg/m   General:  Morbidly obese woman, seated comfortably in the exam room. HEENT: No conjunctival pallor or scleral icterus.  Moist mucous membranes.  OP clear. Neck: Supple without  lymphadenopathy or thyromegaly. No obvious JVD or HJR, though body habitus limits evaluation. Lungs: Normal work of breathing.  Clear to auscultation bilaterally without wheezes or crackles. Heart: Regular rate and rhythm without murmurs, rubs, or gallops.  Non-displaced PMI. Abd: Bowel sounds present.  Soft, nontender, and nondistended. Unable to assess hepatosplenomegaly due to body habitus. Ext: 1+ pretibial edema.  Radial, PT, and DP pulses are 2+ bilaterally. Skin: warm and dry without rash  EKG:  Normal sinus rhythm with left anterior fascicular block and nonspecific T-wave changes. QRS voltages less prominent today in the limb leads. Otherwise, there has been no significant interval change since the prior tracing on 11/09/16 (I have personally reviewed both tracings).  Lab Results  Component Value Date   WBC 6.7 11/09/2016   HGB 14.9 11/09/2016   HCT 42.3 11/09/2016   MCV 88.6 11/09/2016   PLT 316 11/09/2016    Lab Results  Component Value Date   NA 141 11/09/2016   K 4.2 11/09/2016   CL 105 11/09/2016   CO2 30 11/09/2016   BUN 9 11/09/2016   CREATININE 1.05 (H) 11/10/2016   GLUCOSE 122 (H) 11/09/2016   ALT 71 (H) 10/20/2016    Lab Results  Component Value Date   CHOL 231 (H) 06/06/2016   HDL 43 06/06/2016   LDLCALC 142 (H) 06/06/2016   TRIG 231 (H) 06/06/2016   CHOLHDL 5.4 06/06/2016    --------------------------------------------------------------------------------------------------  ASSESSMENT AND PLAN: Paroxysmal supraventricular tachycardia Patient continues to have frequent palpitations, though only rare ectopy and a single episode of brief SVT were noted on her 14-day event monitor. I suspect that her symptoms are largely noncardiac. However, we have agreed to discontinue metoprolol and start diltiazem 180 mg daily to see if this improves her symptoms. I have counseled her on avoiding caffeine, including discontinuation of BC powder. Further consideration of  possible component of anxiety, given that symptoms improve with alprazolam, should be considered. I will defer this to Dr. Birdie Sons.  Atypical chest pain Chest pain has resolved following recent hospitalization. Previous workup has been unremarkable, including myocardial perfusion stress test in 05/2016 demonstrating no evidence of ischemia or infarct. EKG today continues to demonstrate left anterior fascicular block and nonspecific T-wave changes. As above, we will switch metoprolol to diltiazem. I have encouraged the patient to increase her activity in an effort to lose weight.  Left arm pain and paresthesias This is atypical for a statin myalgia. However, the patient is very concerned that atorvastatin is causing her symptoms. I have advised her to continue holding atorvastatin for a few weeks to see if her symptoms resolve. If not, further evaluation for possible neuropathic or muscular skeletal etiology should be considered. The patient may benefit from  a trial of a different statin in the future.  Hypertension Diastolic blood pressure borderline elevated today. As above, we will switch metoprolol to diltiazem. She should continue the remainder of her medications.  Follow-up: Return to clinic in 3 months.  Yvonne Kendallhristopher Juni Glaab, MD 11/29/2016 9:54 AM

## 2016-11-29 NOTE — Patient Instructions (Signed)
Medication Instructions:  Your physician has recommended you make the following change in your medication:  1- STOP taking Lipitor. 2- STOP taking Metoprolol. 3- START Diltiazem 180 mg (1 tablet) by mouth once a day. 4- STOP taking BC powders.   Labwork: - None ordered.   Testing/Procedures: - None ordered.  Follow-Up: Your physician recommends that you schedule a follow-up appointment in: 3 MONTHS WITH DR END.  If you need a refill on your cardiac medications before your next appointment, please call your pharmacy.

## 2016-11-30 ENCOUNTER — Ambulatory Visit: Payer: Self-pay | Admitting: Family Medicine

## 2016-12-06 ENCOUNTER — Telehealth: Payer: Self-pay | Admitting: Internal Medicine

## 2016-12-06 NOTE — Telephone Encounter (Signed)
[facility], I will forward

## 2016-12-06 NOTE — Telephone Encounter (Signed)
New Message:   Pt said she saw Dr End 2 weeks ago and he started her on Cartia XT. She wanted him to know she is feeling so much better.She also wants to thank him and his staff for being so nice and caring.She really appreciate him and wanted him to know that.

## 2016-12-06 NOTE — Telephone Encounter (Signed)
Thank you for the update.  I am glad that she is feeling better.  We will follow-up as planned in the [place] office.

## 2016-12-08 ENCOUNTER — Encounter: Payer: Self-pay | Admitting: Family Medicine

## 2016-12-08 ENCOUNTER — Ambulatory Visit (INDEPENDENT_AMBULATORY_CARE_PROVIDER_SITE_OTHER): Payer: Commercial Managed Care - HMO

## 2016-12-08 ENCOUNTER — Ambulatory Visit (INDEPENDENT_AMBULATORY_CARE_PROVIDER_SITE_OTHER): Payer: Commercial Managed Care - HMO | Admitting: Family Medicine

## 2016-12-08 VITALS — BP 110/78 | HR 70 | Temp 98.0°F | Wt 228.4 lb

## 2016-12-08 DIAGNOSIS — R202 Paresthesia of skin: Secondary | ICD-10-CM | POA: Diagnosis not present

## 2016-12-08 DIAGNOSIS — J449 Chronic obstructive pulmonary disease, unspecified: Secondary | ICD-10-CM | POA: Diagnosis not present

## 2016-12-08 DIAGNOSIS — R002 Palpitations: Secondary | ICD-10-CM | POA: Diagnosis not present

## 2016-12-08 DIAGNOSIS — E6609 Other obesity due to excess calories: Secondary | ICD-10-CM

## 2016-12-08 DIAGNOSIS — K760 Fatty (change of) liver, not elsewhere classified: Secondary | ICD-10-CM | POA: Diagnosis not present

## 2016-12-08 DIAGNOSIS — R6889 Other general symptoms and signs: Secondary | ICD-10-CM | POA: Diagnosis not present

## 2016-12-08 DIAGNOSIS — M47812 Spondylosis without myelopathy or radiculopathy, cervical region: Secondary | ICD-10-CM | POA: Diagnosis not present

## 2016-12-08 MED ORDER — TRAMADOL HCL 50 MG PO TABS: ORAL_TABLET | ORAL | 0 refills | Status: DC

## 2016-12-08 NOTE — Assessment & Plan Note (Signed)
Advised on diet and exercise. Given diet instructions. She'll work on cutting back on sugary drinks and eating 2-3 healthier meals a week. She will also start going to the gym 2 days a week.

## 2016-12-08 NOTE — Progress Notes (Signed)
Marikay Alar, MD Phone: [phone]  Rebecca Ramos is a 56 y.o. female who presents today for follow-up.  Patient notes she feels significantly better with regards to the palpitations and chest pain that she had been having. Her cardiologist switched her to diltiazem and took her off lisinopril, metoprolol, amlodipine. She feels significantly better with this. No chest pain, shortness of breath, or palpitations. Doesn't have that startling feeling. Her anxiety is significantly better as well. Taking Xanax 3 times a day.  Continues to get occasional numbness in the radial aspect of her left arm. It comes on if she picks something up or if she lays on it in the wrong way. Has not improved. No numbness elsewhere. No weakness. No neck pain. No prior imaging of her neck.  Patient has COPD. Denies cough and shortness of breath. She's taking DuoNeb nebs and Combivent. Also taking Symbicort.  Obesity: Patient notes her diet is terrible. She does like fruits and vegetables. Does drink a fair amount of sugary drinks. Walks her dog in the morning for activity. Has been on phentermine previously.  PMH: nonsmoker.   ROS see history of present illness  Objective  Physical Exam Vitals:   12/08/16 1055  BP: 110/78  Pulse: 70  Temp: 98 F (36.7 C)    BP Readings from Last 3 Encounters:  12/08/16 110/78  11/29/16 126/90  11/10/16 101/64   Wt Readings from Last 3 Encounters:  12/08/16 228 lb 6.4 oz (103.6 kg)  11/29/16 232 lb 12 oz (105.6 kg)  11/09/16 225 lb 3.2 oz (102.2 kg)    Physical Exam  Constitutional: No distress.  Cardiovascular: Normal rate, regular rhythm and normal heart sounds.   Pulmonary/Chest: Effort normal and breath sounds normal.  Musculoskeletal:  No neck tenderness, negative Spurling's bilaterally  Neurological: She is alert. Gait normal.  CN 2-12 intact, 5/5 strength in bilateral biceps, triceps, grip, quads, hamstrings, plantar and dorsiflexion, sensation to  light touch intact in bilateral UE and LE, normal gait, 2+ patellar reflexes  Skin: Skin is warm and dry. She is not diaphoretic.     Assessment/Plan: Please see individual problem list.  COPD (chronic obstructive pulmonary disease) (HCC) Stable. Asymptomatic. Continue Symbicort. I advised the Combivent and DuoNeb's serve the same purpose. Discussed only using Combivent.  Fatty liver Discussed that it appears that she has been exposed to hepatitis B previously. It appears that she has cleared the infection. Encouraged hepatitis A vaccination and she will contact us to set this up.  Palpitations Significantly improved. Asymptomatic at this time. She'll continue diltiazem. Continue to follow with cardiology. Given return precautions.  Paresthesia of left arm Continues to have issues with this. Suspect nerve impingement in her neck given isolated distribution of numbness. Will obtain an x-ray of her cervical spine to evaluate further. She'll continue to monitor. Given return precautions.  Obesity Advised on diet and exercise. Given diet instructions. She'll work on cutting back on sugary drinks and eating 2-3 healthier meals a week. She will also start going to the gym 2 days a week.   Orders Placed This Encounter  Procedures  . DG Cervical Spine Complete    Standing Status:   Future    Number of Occurrences:   1    Standing Expiration Date:   02/05/2018    Order Specific Question:   Reason for Exam (SYMPTOM  OR DIAGNOSIS REQUIRED)    Answer:   paresthesias down left arm    Order Specific Question:   Is  patient pregnant?    Answer:   No    Order Specific Question:   Preferred imaging location?    Answer:   [facility]    Meds ordered this encounter  Medications  . traMADol (ULTRAM) 50 MG tablet    Sig: TAKE 1 TABLET BY MOUTH EVERY 8 HOURS AS NEEDED FOR MODERATE TO SEVERE PAIN    Dispense:  60 tablet    Refill:  0    Marikay AlarEric Kadince Boxley, MD [facility]   [facility]

## 2016-12-08 NOTE — Patient Instructions (Signed)
Nice to see you. Please work on diet and exercise as we discussed. There are diet instructions below. Please continue your diltiazem. We are going to obtain an x-ray of your neck to evaluate for a cause of the numbness you get in her left arm. Please do not use the DuoNeb as they are the same thing as the Combivent.  Diet Recommendations  Starchy (carb) foods: Bread, rice, pasta, potatoes, corn, cereal, grits, crackers, bagels, muffins, all baked goods.  (Fruits, milk, and yogurt also have carbohydrate, but most of these foods will not spike your blood sugar as the starchy foods will.)  A few fruits do cause high blood sugars; use small portions of bananas (limit to 1/2 at a time), grapes, watermelon, oranges, and most tropical fruits.    Protein foods: Meat, fish, poultry, eggs, dairy foods, and beans such as pinto and kidney beans (beans also provide carbohydrate).   1. Eat at least 3 meals and 1-2 snacks per day. Never go more than 4-5 hours while awake without eating. Eat breakfast within the first hour of getting up.   2. Limit starchy foods to TWO per meal and ONE per snack. ONE portion of a starchy  food is equal to the following:   - ONE slice of bread (or its equivalent, such as half of a hamburger bun).   - 1/2 cup of a "scoopable" starchy food such as potatoes or rice.   - 15 grams of carbohydrate as shown on food label.  3. Include at every meal: a protein food, a carb food, and vegetables and/or fruit.   - Obtain twice the volume of veg's as protein or carbohydrate foods for both lunch and dinner.   - Fresh or frozen veg's are best.   - Keep frozen veg's on hand for a quick vegetable serving.

## 2016-12-08 NOTE — Assessment & Plan Note (Signed)
Discussed that it appears that she has been exposed to hepatitis B previously. It appears that she has cleared the infection. Encouraged hepatitis A vaccination and she will contact us to set this up.

## 2016-12-08 NOTE — Assessment & Plan Note (Signed)
Continues to have issues with this. Suspect nerve impingement in her neck given isolated distribution of numbness. Will obtain an x-ray of her cervical spine to evaluate further. She'll continue to monitor. Given return precautions.

## 2016-12-08 NOTE — Progress Notes (Signed)
Pre visit review using our clinic review tool, if applicable. No additional management support is needed unless otherwise documented below in the visit note. 

## 2016-12-08 NOTE — Assessment & Plan Note (Signed)
Stable. Asymptomatic. Continue Symbicort. I advised the Combivent and DuoNeb's serve the same purpose. Discussed only using Combivent.

## 2016-12-08 NOTE — Assessment & Plan Note (Signed)
Significantly improved. Asymptomatic at this time. She'll continue diltiazem. Continue to follow with cardiology. Given return precautions.

## 2016-12-13 ENCOUNTER — Telehealth: Payer: Self-pay | Admitting: *Deleted

## 2016-12-13 ENCOUNTER — Other Ambulatory Visit: Payer: Self-pay | Admitting: Family Medicine

## 2016-12-13 DIAGNOSIS — R202 Paresthesia of skin: Secondary | ICD-10-CM

## 2016-12-13 NOTE — Telephone Encounter (Signed)
Pt. Requested to have a medication called in for her left arm pain. Pt has a Hx of pain in this arm. Pt also requested to have a Rx for a new blood sugar meter with lancets and strips.  Pharmacy [facility] in [place] contact [phone]

## 2016-12-13 NOTE — Telephone Encounter (Signed)
Patient would like to try  Lyrica, patient would also like to know if there is a cream she can put on it as well, patient states something like icy hot but an rx so that insurance will pay

## 2016-12-13 NOTE — Telephone Encounter (Signed)
Please see if the patient has ever tried gabapentin or Lyrica for pain. We could try one of these if she has not tried them previously.

## 2016-12-13 NOTE — Telephone Encounter (Signed)
Patient states the tramadol is not helping his arm pain, is there anything else she can try? The pain comes and goes. Patient would like refill on Combivent and symbicort. Symbicort last filled 09/27/16 1 11rf Combivent last filled by Trellis MomentAmy Krebs,NP 06/10/16 1 11rf Patient would also like a glucometer. Mail order pharmacy

## 2016-12-14 MED ORDER — PREGABALIN 75 MG PO CAPS: 75.0000 mg | ORAL_CAPSULE | Freq: Two times a day (BID) | ORAL | 1 refills | Status: DC

## 2016-12-14 NOTE — Telephone Encounter (Signed)
Please fax. Icy hot is probably the best option over-the-counter for topical medication. If the tramadol is not helping she should discontinue this.

## 2016-12-14 NOTE — Telephone Encounter (Signed)
Patient notified and rx faxed. 

## 2016-12-16 ENCOUNTER — Ambulatory Visit (INDEPENDENT_AMBULATORY_CARE_PROVIDER_SITE_OTHER): Payer: Medicare HMO | Admitting: Podiatry

## 2016-12-16 ENCOUNTER — Ambulatory Visit (INDEPENDENT_AMBULATORY_CARE_PROVIDER_SITE_OTHER): Payer: Medicare HMO

## 2016-12-16 DIAGNOSIS — M659 Synovitis and tenosynovitis, unspecified: Secondary | ICD-10-CM

## 2016-12-16 DIAGNOSIS — M79672 Pain in left foot: Secondary | ICD-10-CM

## 2016-12-16 DIAGNOSIS — M7751 Other enthesopathy of right foot: Secondary | ICD-10-CM

## 2016-12-16 DIAGNOSIS — R6889 Other general symptoms and signs: Secondary | ICD-10-CM | POA: Diagnosis not present

## 2016-12-16 DIAGNOSIS — M25571 Pain in right ankle and joints of right foot: Secondary | ICD-10-CM

## 2016-12-16 DIAGNOSIS — M25572 Pain in left ankle and joints of left foot: Secondary | ICD-10-CM

## 2016-12-16 DIAGNOSIS — M7752 Other enthesopathy of left foot: Secondary | ICD-10-CM

## 2016-12-22 ENCOUNTER — Telehealth: Payer: Self-pay | Admitting: Family Medicine

## 2016-12-22 NOTE — Telephone Encounter (Signed)
I called and left a vm to call the office to sch a AWV. Thank you! °

## 2016-12-24 MED ORDER — BETAMETHASONE SOD PHOS & ACET 6 (3-3) MG/ML IJ SUSP: 3.0000 mg | Freq: Once | INTRAMUSCULAR | Status: AC
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
## 2016-12-26 DIAGNOSIS — M792 Neuralgia and neuritis, unspecified: Secondary | ICD-10-CM | POA: Diagnosis not present

## 2016-12-26 DIAGNOSIS — M47816 Spondylosis without myelopathy or radiculopathy, lumbar region: Secondary | ICD-10-CM | POA: Diagnosis not present

## 2016-12-26 DIAGNOSIS — G5612 Other lesions of median nerve, left upper limb: Secondary | ICD-10-CM | POA: Diagnosis not present

## 2016-12-26 DIAGNOSIS — G894 Chronic pain syndrome: Secondary | ICD-10-CM | POA: Diagnosis not present

## 2016-12-28 ENCOUNTER — Ambulatory Visit: Payer: Self-pay | Admitting: Family Medicine

## 2017-01-08 ENCOUNTER — Emergency Department
Admission: EM | Admit: 2017-01-08 | Discharge: 2017-01-08 | Disposition: A | Discharge status: Home / Self Care | Payer: Medicare HMO | Attending: Emergency Medicine | Admitting: Emergency Medicine

## 2017-01-08 ENCOUNTER — Emergency Department: Payer: Medicare HMO

## 2017-01-08 ENCOUNTER — Encounter: Payer: Self-pay | Admitting: Emergency Medicine

## 2017-01-08 DIAGNOSIS — M25532 Pain in left wrist: Secondary | ICD-10-CM

## 2017-01-08 DIAGNOSIS — M79642 Pain in left hand: Secondary | ICD-10-CM | POA: Diagnosis not present

## 2017-01-08 DIAGNOSIS — S0993XA Unspecified injury of face, initial encounter: Secondary | ICD-10-CM | POA: Diagnosis not present

## 2017-01-08 DIAGNOSIS — R079 Chest pain, unspecified: Secondary | ICD-10-CM | POA: Diagnosis not present

## 2017-01-08 DIAGNOSIS — S63502A Unspecified sprain of left wrist, initial encounter: Secondary | ICD-10-CM | POA: Insufficient documentation

## 2017-01-08 DIAGNOSIS — Y999 Unspecified external cause status: Secondary | ICD-10-CM | POA: Diagnosis not present

## 2017-01-08 DIAGNOSIS — M25531 Pain in right wrist: Secondary | ICD-10-CM | POA: Diagnosis not present

## 2017-01-08 DIAGNOSIS — W010XXA Fall on same level from slipping, tripping and stumbling without subsequent striking against object, initial encounter: Secondary | ICD-10-CM | POA: Diagnosis not present

## 2017-01-08 DIAGNOSIS — J449 Chronic obstructive pulmonary disease, unspecified: Secondary | ICD-10-CM | POA: Insufficient documentation

## 2017-01-08 DIAGNOSIS — Y92009 Unspecified place in unspecified non-institutional (private) residence as the place of occurrence of the external cause: Secondary | ICD-10-CM | POA: Diagnosis not present

## 2017-01-08 DIAGNOSIS — I10 Essential (primary) hypertension: Secondary | ICD-10-CM | POA: Diagnosis not present

## 2017-01-08 DIAGNOSIS — S6992XA Unspecified injury of left wrist, hand and finger(s), initial encounter: Secondary | ICD-10-CM | POA: Diagnosis not present

## 2017-01-08 DIAGNOSIS — Y939 Activity, unspecified: Secondary | ICD-10-CM | POA: Insufficient documentation

## 2017-01-08 DIAGNOSIS — R0789 Other chest pain: Secondary | ICD-10-CM | POA: Insufficient documentation

## 2017-01-08 DIAGNOSIS — W19XXXA Unspecified fall, initial encounter: Secondary | ICD-10-CM | POA: Diagnosis not present

## 2017-01-08 DIAGNOSIS — M79602 Pain in left arm: Secondary | ICD-10-CM | POA: Diagnosis not present

## 2017-01-08 MED ORDER — IBUPROFEN 600 MG PO TABS
600.0000 mg | ORAL_TABLET | Freq: Once | ORAL | Status: AC
  Administered 2017-01-08: 600 mg via ORAL
  Filled 2017-01-08: qty 1

## 2017-01-08 MED ORDER — HYDROCODONE-ACETAMINOPHEN 5-325 MG PO TABS
2.0000 | ORAL_TABLET | Freq: Once | ORAL | Status: AC
  Administered 2017-01-08: 2 via ORAL
  Filled 2017-01-08: qty 2

## 2017-01-08 MED ORDER — IBUPROFEN 600 MG PO TABS: 600.0000 mg | ORAL_TABLET | Freq: Three times a day (TID) | ORAL | 0 refills | Status: AC | PRN

## 2017-01-08 NOTE — ED Triage Notes (Signed)
EMS pt to rm 3 from home.with report of pt fell landing on her front side. Pt co pain to her chest , chin and left thumb/index finger. Pt alert and denies LOC during the fall.

## 2017-01-08 NOTE — Discharge Instructions (Signed)
It is normal. A be more sore tomorrow and for your aching and stiffness to last a few more days. Please return to the emergency department for any new or worsening symptoms such as worsening pain, numbness, weakness, or for any other concerns.

## 2017-01-08 NOTE — ED Provider Notes (Signed)
[facility] Emergency Department Provider Note  ____________________________________________   First MD Initiated Contact with Patient 01/08/17 1955     (approximate)  I have reviewed the triage vital signs and the nursing notes.   HISTORY  Chief Complaint No chief complaint on file.   HPI Rebecca Ramos is a 56 y.o. female who is brought to the emergency department via EMS for severe left index finger, left thumb, chest, and chin pain that happened roughly 15 minutes prior to arrival. She stood up to reach for something at home and she tripped and fell forward and landed on her daughter's dollhouse. She did not lose consciousness. She takes no blood thinning medication.   Past Medical History:  Diagnosis Date  . Acid reflux   . Anxiety   . Chronic lower back pain    osteoarthritis in luber spine  . COPD (chronic obstructive pulmonary disease) (HCC)   . Depression    Bipolar  . Heart murmur   . History of degenerative disc disease   . History of pulmonary embolus (PE)   . HTN (hypertension) 03/20/2015  . Hypertension   . Osteoarthritis of lumbosacral spine   . PSVT (paroxysmal supraventricular tachycardia) (HCC)   . Right Achilles tendinitis   . Thrombus    right lung in 2015, off anticoagulation now  . Transaminitis     Patient Active Problem List   Diagnosis Date Noted  . Paresthesia of left arm 12/08/2016  . Headache 11/10/2016  . Anxiety 11/10/2016  . Palpitations 11/09/2016  . Fatty liver 11/09/2016  . Abdominal pain, left upper quadrant 10/20/2016  . Nausea without vomiting 08/09/2016  . Pedal edema 08/09/2016  . Frequent urination 06/23/2016  . Chronic pain syndrome 06/23/2016  . GERD (gastroesophageal reflux disease) 06/10/2016  . COPD (chronic obstructive pulmonary disease) (HCC) 06/08/2016  . Acute bronchitis 06/08/2016  . Hyperlipidemia 06/08/2016  . Obesity 06/08/2016  . Cocaine abuse 06/08/2016  . Tobacco abuse  counseling 06/08/2016  . Atypical chest pain 06/06/2016  . Cocaine use disorder, moderate, dependence (HCC) 03/28/2016  . Severe recurrent major depression without psychotic features (HCC) 03/26/2016  . Suicide attempt 03/26/2016  . Chronic back pain 03/26/2016  . HTN (hypertension) 03/20/2015    Past Surgical History:  Procedure Laterality Date  . ABDOMINAL HYSTERECTOMY    . CESAREAN SECTION Bilateral   . CESAREAN SECTION Bilateral   . ELBOW SURGERY     bilateral  . thumb surgery     right   . TONSILLECTOMY      Prior to Admission medications   Medication Sig Start Date End Date Taking? Authorizing Provider  ALPRAZolam Prudy Feeler) 1 MG tablet Take 1 mg by mouth 4 (four) times daily.  05/18/16   Historical Provider, MD  Blood Pressure Monitoring (BLOOD PRESSURE CUFF) MISC 1 each by Does not apply route daily. 06/16/16   Amy Rusty Aus, NP  budesonide-formoterol (SYMBICORT) 160-4.5 MCG/ACT inhaler Inhale 2 puffs into the lungs 2 (two) times daily. 09/27/16 09/27/17  Glori Luis, MD  COMBIVENT RESPIMAT 20-100 MCG/ACT AERS respimat Inhale 1 puff into the lungs 4 (four) times daily as needed. For wheezing. 06/10/16   Amy Rusty Aus, NP  cyanocobalamin (,VITAMIN B-12,) 1000 MCG/ML injection Inject into the muscle. 01/13/16   Historical Provider, MD  diltiazem (CARDIZEM CD) 180 MG 24 hr capsule Take 1 capsule (180 mg total) by mouth daily. 11/29/16 02/27/17  Yvonne Kendall, MD  esomeprazole (NEXIUM) 20 MG capsule Take 1 capsule (20  mg total) by mouth 2 (two) times daily before a meal. 09/27/16   Glori LuisEric G Sonnenberg, MD  fluticasone [facility](FLONASE) 50 MCG/ACT nasal spray Place 2 sprays into both nostrils daily as needed for rhinitis.     Historical Provider, MD  furosemide (LASIX) 20 MG tablet TAKE 1 TABLET(20 MG) BY MOUTH DAILY 09/30/16   Glori LuisEric G Sonnenberg, MD  hydrOXYzine (ATARAX/VISTARIL) 50 MG tablet Take 50 mg by mouth 4 (four) times daily as needed.    Historical Provider, MD    ipratropium-albuterol (DUONEB) 0.5-2.5 (3) MG/3ML SOLN Take 3 mLs by nebulization every 4 (four) hours as needed. 06/08/16   Katharina Caperima Vaickute, MD  lisinopril (PRINIVIL,ZESTRIL) 10 MG tablet Take 10 mg by mouth daily.    Historical Provider, MD  omega-3 acid ethyl esters (LOVAZA) 1 g capsule  06/17/16   Historical Provider, MD  ondansetron (ZOFRAN) 4 MG tablet Take 1 tablet (4 mg total) by mouth every 8 (eight) hours as needed for nausea or vomiting. 09/27/16   Glori LuisEric G Sonnenberg, MD  polyethylene glycol powder (GLYCOLAX/MIRALAX) powder Take 17 g by mouth daily. 10/21/16   Glori LuisEric G Sonnenberg, MD  ranitidine (ZANTAC) 150 MG tablet Take 150 mg by mouth 2 (two) times daily as needed for heartburn.    Historical Provider, MD  traMADol (ULTRAM) 50 MG tablet TAKE 1 TABLET BY MOUTH EVERY 8 HOURS AS NEEDED FOR MODERATE TO SEVERE PAIN 12/08/16   Glori LuisEric G Sonnenberg, MD  Vitamin D, Ergocalciferol, (DRISDOL) 50000 units CAPS capsule Take 50,000 Units by mouth once a week. monday 03/16/16   Historical Provider, MD  zolpidem (AMBIEN) 10 MG tablet Take 10 mg by mouth at bedtime.  06/20/16   Historical Provider, MD    Allergies Honey bee venom; Sulfacetamide sodium; Levofloxacin; Sulfa antibiotics; and Codeine  Family History  Problem Relation Age of Onset  . CAD Mother   . Heart disease Mother     Pacemaker  . CAD Sister   . Heart disease Sister     Heart surgery x 1  . Throat cancer Brother   . CAD Maternal Grandmother   . CAD Maternal Grandfather     Social History Social History  Substance Use Topics  . Smoking status: Never Smoker  . Smokeless tobacco: Never Used  . Alcohol use No    Review of Systems Constitutional: No fever/chills Eyes: No visual changes. ENT: No sore throat. Cardiovascular: Positive chest pain. Respiratory: Denies shortness of breath. Gastrointestinal: No abdominal pain.  No nausea, no vomiting.  No diarrhea.  No constipation. Genitourinary: Negative for  dysuria. Musculoskeletal: Negative for back pain. Skin: Negative for rash. Neurological: Negative for headaches, focal weakness or numbness.  10-point ROS otherwise negative.  ____________________________________________   PHYSICAL EXAM:  VITAL SIGNS: ED Triage Vitals  Enc Vitals Group     BP      Pulse      Resp      Temp      Temp src      SpO2      Weight      Height      Head Circumference      Peak Flow      Pain Score      Pain Loc      Pain Edu?      Excl. in GC?     Constitutional: Alert and oriented x 4 well appearing nontoxic no diaphoresis speaks in full, clear sentences Eyes: PERRL EOMI. Head: Atraumatic.Normal popsicle stick test bilaterally no  zygomatic or maxillary tenderness Nose: No congestion/rhinnorhea. Mouth/Throat: No trismus Neck: No stridor.   Cardiovascular: Normal rate, regular rhythm. Grossly normal heart sounds.  Good peripheral circulation. Chest wall stable no ecchymosis or crepitus Respiratory: Normal respiratory effort.  No retractions. Lungs CTAB and moving good air Gastrointestinal: Soft nondistended nontender no rebound no guarding no peritonitis no McBurney's tenderness negative Rovsing's no costovertebral tenderness negative Murphy's Musculoskeletal: No deformities noted. No tenderness of her distal radius or ulna. No snuffbox tenderness. No tenderness of her thumb or index finger. She is able to abduct and adduct her thumb in slight flexion without difficulty with normal strength Neurologic:  Normal speech and language. No gross focal neurologic deficits are appreciated. Skin:  Skin is warm, dry and intact. No rash noted. Psychiatric: Mood and affect are normal. Speech and behavior are normal.  ____________________________________________   LABS (all labs ordered are listed, but only abnormal results are displayed)  Labs Reviewed - No data to  display ____________________________________________  EKG   ____________________________________________  RADIOLOGY  All x-rays negative for acute pathology ____________________________________________   PROCEDURES  Procedure(s) performed: no  Procedures  Critical Care performed: no  ____________________________________________   INITIAL IMPRESSION / ASSESSMENT AND PLAN / ED COURSE  Pertinent labs & imaging results that were available during my care of the patient were reviewed by me and considered in my medical decision making (see chart for details).  Patient gives a clear story from mechanical fall. X-rays are pending as well as pain control.    ----------------------------------------- 9:22 PM on 01/08/2017 -----------------------------------------  The patient's pain is improved. She has no evidence of an occult scaphoid fracture nor a gamekeeper's thumb. She is neurovascularly intact following splinting. She has no acute fracture dislocation on imaging. She is medically stable for outpatient management with a short course of NSAIDs.  ____________________________________________   FINAL CLINICAL IMPRESSION(S) / ED DIAGNOSES  Final diagnoses:  None      NEW MEDICATIONS STARTED DURING THIS VISIT:  New Prescriptions   No medications on file     Note:  This document was prepared using Dragon voice recognition software and may include unintentional dictation errors.     Merrily Brittle, MD 01/08/17 2242

## 2017-01-09 ENCOUNTER — Encounter: Payer: Self-pay | Admitting: Family Medicine

## 2017-01-09 ENCOUNTER — Ambulatory Visit: Payer: Self-pay | Admitting: Family Medicine

## 2017-01-09 DIAGNOSIS — Z0289 Encounter for other administrative examinations: Secondary | ICD-10-CM

## 2017-01-12 ENCOUNTER — Other Ambulatory Visit: Payer: Self-pay | Admitting: Podiatry

## 2017-01-13 ENCOUNTER — Ambulatory Visit (INDEPENDENT_AMBULATORY_CARE_PROVIDER_SITE_OTHER): Payer: Medicare HMO | Admitting: Podiatry

## 2017-01-13 DIAGNOSIS — M7731 Calcaneal spur, right foot: Secondary | ICD-10-CM

## 2017-01-13 DIAGNOSIS — G8929 Other chronic pain: Secondary | ICD-10-CM | POA: Diagnosis not present

## 2017-01-13 DIAGNOSIS — M7751 Other enthesopathy of right foot: Secondary | ICD-10-CM

## 2017-01-13 DIAGNOSIS — M6528 Calcific tendinitis, other site: Secondary | ICD-10-CM | POA: Diagnosis not present

## 2017-01-13 DIAGNOSIS — M25571 Pain in right ankle and joints of right foot: Secondary | ICD-10-CM

## 2017-01-13 DIAGNOSIS — R6889 Other general symptoms and signs: Secondary | ICD-10-CM | POA: Diagnosis not present

## 2017-01-13 DIAGNOSIS — M659 Synovitis and tenosynovitis, unspecified: Secondary | ICD-10-CM | POA: Diagnosis not present

## 2017-01-13 NOTE — Patient Instructions (Signed)
Pre-Operative Instructions  Congratulations, you have decided to take an important step to improving your quality of life.  You can be assured that the doctors of [facility] will be with you every step of the way.  1. Plan to be at the surgery center/hospital at least 1 (one) hour prior to your scheduled time unless otherwise directed by the surgical center/hospital staff.  You must have a responsible adult accompany you, remain during the surgery and drive you home.  Make sure you have directions to the surgical center/hospital and know how to get there on time. 2. For hospital based surgery you will need to obtain a history and physical form from your family physician within 1 month prior to the date of surgery- we will give you a form for you primary physician.  3. We make every effort to accommodate the date you request for surgery.  There are however, times where surgery dates or times have to be moved.  We will contact you as soon as possible if a change in schedule is required.   4. No Aspirin/Ibuprofen for one week before surgery.  If you are on aspirin, any non-steroidal anti-inflammatory medications (Mobic, Aleve, Ibuprofen) you should stop taking it 7 days prior to your surgery.  You make take Tylenol  For pain prior to surgery.  5. Medications- If you are taking daily heart and blood pressure medications, seizure, reflux, allergy, asthma, anxiety, pain or diabetes medications, make sure the surgery center/hospital is aware before the day of surgery so they may notify you which medications to take or avoid the day of surgery. 6. No food or drink after midnight the night before surgery unless directed otherwise by surgical center/hospital staff. 7. No alcoholic beverages 24 hours prior to surgery.  No smoking 24 hours prior to or 24 hours after surgery. 8. Wear loose pants or shorts- loose enough to fit over bandages, boots, and casts. 9. No slip on shoes, sneakers are best. 10. Bring  your boot with you to the surgery center/hospital.  Also bring crutches or a walker if your physician has prescribed it for you.  If you do not have this equipment, it will be provided for you after surgery. 11. If you have not been contracted by the surgery center/hospital by the day before your surgery, call to confirm the date and time of your surgery. 12. Leave-time from work may vary depending on the type of surgery you have.  Appropriate arrangements should be made prior to surgery with your employer. 13. Prescriptions will be provided immediately following surgery by your doctor.  Have these filled as soon as possible after surgery and take the medication as directed. 14. Remove nail polish on the operative foot. 15. Wash the night before surgery.  The night before surgery wash the foot and leg well with the antibacterial soap provided and water paying special attention to beneath the toenails and in between the toes.  Rinse thoroughly with water and dry well with a towel.  Perform this wash unless told not to do so by your physician.  Enclosed: 1 Ice pack (please put in freezer the night before surgery)   1 Hibiclens skin cleaner   Pre-op Instructions  If you have any questions regarding the instructions, do not hesitate to call our office.  [place]: [address] , [place] [address] [phone]  [place]: [address] [place], [place] [address] [phone]  [place]: [address]  [place], [place] [address] [phone]   Dr.   Norman Regal DPM, Dr. Matthew Wagoner DPM, Dr. M. Todd Hyatt DPM, Dr. Titorya Stover DPM 

## 2017-01-16 ENCOUNTER — Encounter: Payer: Self-pay | Admitting: Family Medicine

## 2017-01-16 ENCOUNTER — Telehealth: Payer: Self-pay | Admitting: *Deleted

## 2017-01-16 ENCOUNTER — Ambulatory Visit (INDEPENDENT_AMBULATORY_CARE_PROVIDER_SITE_OTHER): Payer: Medicare HMO | Admitting: Family Medicine

## 2017-01-16 VITALS — BP 120/88 | HR 63 | Temp 98.4°F | Wt 236.6 lb

## 2017-01-16 DIAGNOSIS — Z01818 Encounter for other preprocedural examination: Secondary | ICD-10-CM | POA: Diagnosis not present

## 2017-01-16 LAB — BASIC METABOLIC PANEL
BUN: 12 mg/dL (ref 6–23)
CHLORIDE: 104 meq/L (ref 96–112)
CO2: 29 meq/L (ref 19–32)
Calcium: 9.8 mg/dL (ref 8.4–10.5)
Creatinine, Ser: 0.94 mg/dL (ref 0.40–1.20)
GFR: 66.09 mL/min (ref >60.00)
Glucose, Bld: 97 mg/dL (ref 70–99)
Potassium: 3.8 mEq/L (ref 3.5–5.1)
SODIUM: 140 meq/L (ref 135–145)

## 2017-01-16 LAB — CBC
HCT: 43.2 % (ref 36.0–46.0)
HEMOGLOBIN: 14.5 g/dL (ref 12.0–15.0)
MCHC: 33.6 g/dL (ref 30.0–36.0)
MCV: 90.8 fl (ref 78.0–100.0)
Platelets: 310 10*3/uL (ref 150.0–400.0)
RBC: 4.75 Mil/uL (ref 3.87–5.11)
RDW: 13.2 % (ref 11.5–15.5)
WBC: 7.3 10*3/uL (ref 4.0–10.5)

## 2017-01-16 NOTE — Progress Notes (Signed)
Pre visit review using our clinic review tool, if applicable. No additional management support is needed unless otherwise documented below in the visit note. 

## 2017-01-16 NOTE — Assessment & Plan Note (Addendum)
Patient presents for preoperative evaluation. Her chronic medical issues are well controlled currently. She is at low risk for cardiac complications. She has had a normal stress test in the last year. Her COPD is well controlled currently with no breathlessness or cough. We will check CBC and BMP today. We will fill out her presurgical form. Her recent chest x-ray reviewed as well. No acute findings are chronic findings.

## 2017-01-16 NOTE — Progress Notes (Signed)
  Rebecca AlarEric Sonnenberg, MD Phone: [phone]  Devra DoppCarol S Simms is a 56 y.o. female who presents today for presurgical evaluation.  Patient presents for presurgical evaluation. She's due to have right ankle arthroscopy with repair of right Achilles tendon and retro calcaneal exostectomy. She notes no chest pain or breathlessness when climbing 2 flights of stairs. She notes no kidney disease. No family history of anesthetic issues. No history of MI or angina. No history of heart failure. She previously had palpitations though these are under control with medication. She's never had a stroke. She's never had any issues with anesthesia. No history of epilepsy or seizures. No thyroid disease. She does have fatty liver disease. No history of asthma. No history of diabetes. She does have a history of COPD though uses her Symbicort and Combivent. She notes no cough or breathlessness.  PMH: nonsmoker.   ROS see history of present illness  Objective  Physical Exam Vitals:   01/16/17 1352  BP: 120/88  Pulse: 63  Temp: 98.4 F (36.9 C)    BP Readings from Last 3 Encounters:  01/16/17 120/88  01/08/17 124/73  12/08/16 110/78   Wt Readings from Last 3 Encounters:  01/16/17 236 lb 9.6 oz (107.3 kg)  01/08/17 220 lb (99.8 kg)  12/08/16 228 lb 6.4 oz (103.6 kg)    Physical Exam  Constitutional: No distress.  HENT:  Head: Normocephalic and atraumatic.  Mouth/Throat: Oropharynx is clear and moist. No oropharyngeal exudate.  Cardiovascular: Normal rate, regular rhythm and normal heart sounds.   Pulmonary/Chest: Effort normal and breath sounds normal.  Abdominal: Soft. Bowel sounds are normal. She exhibits no distension. There is no tenderness. There is no rebound and no guarding.  Musculoskeletal: She exhibits no edema.  Neurological: She is alert. Gait normal.  Skin: Skin is warm and dry. She is not diaphoretic.     Assessment/Plan: Please see individual problem list.  Pre-op  evaluation Patient presents for preoperative evaluation. Her chronic medical issues are well controlled currently. She is at low risk for cardiac complications. She has had a normal stress test in the last year. Her COPD is well controlled currently with no breathlessness or cough. We will check CBC and BMP today. We will fill out her presurgical form. Her recent chest x-ray reviewed as well. No acute findings are chronic findings.    Orders Placed This Encounter  Procedures  . CBC  . Basic Metabolic Panel (BMET)    Rebecca AlarEric Sonnenberg, MD [facility]

## 2017-01-16 NOTE — Patient Instructions (Signed)
Nice to see you. We will get sure form filled out for surgery and fax it to your surgical Center.

## 2017-01-16 NOTE — Telephone Encounter (Signed)
"  Could you please give me a return call."

## 2017-01-17 NOTE — Telephone Encounter (Signed)
I attempted to return her call.  I left her a message to call me back. 

## 2017-01-17 NOTE — Telephone Encounter (Signed)
"  I'm calling to see if Dr. Logan BoresEvans has scheduled a date for my surgery?"  Do you have a date in mind that you would like to have the surgery?  "I'd like to do it as soon as possible because he's going to have to do the other one a month later. Does he do any other day other than Thursday?"  His surgery day is Thursday.  He can do your surgery on March 29.  "What time will it be?"  I cannot give you a time.  Someone from the surgical center will call you a day or two prior to surgery date with the arrival time.

## 2017-01-18 ENCOUNTER — Telehealth: Payer: Self-pay | Admitting: *Deleted

## 2017-01-19 ENCOUNTER — Ambulatory Visit: Payer: Medicare HMO

## 2017-01-19 DIAGNOSIS — F4310 Post-traumatic stress disorder, unspecified: Secondary | ICD-10-CM | POA: Diagnosis not present

## 2017-01-19 DIAGNOSIS — Z79899 Other long term (current) drug therapy: Secondary | ICD-10-CM | POA: Diagnosis not present

## 2017-01-19 DIAGNOSIS — R6889 Other general symptoms and signs: Secondary | ICD-10-CM | POA: Diagnosis not present

## 2017-01-19 DIAGNOSIS — F410 Panic disorder [episodic paroxysmal anxiety] without agoraphobia: Secondary | ICD-10-CM | POA: Diagnosis not present

## 2017-01-19 DIAGNOSIS — F064 Anxiety disorder due to known physiological condition: Secondary | ICD-10-CM | POA: Diagnosis not present

## 2017-01-19 DIAGNOSIS — F331 Major depressive disorder, recurrent, moderate: Secondary | ICD-10-CM | POA: Diagnosis not present

## 2017-01-19 NOTE — Telephone Encounter (Signed)
"  I'm just calling to verify that my surgery is scheduled for 02/29/2018."  Yes, it is scheduled for 02/09/2017.

## 2017-01-22 ENCOUNTER — Encounter: Payer: Self-pay | Admitting: Family Medicine

## 2017-01-23 ENCOUNTER — Ambulatory Visit: Payer: Self-pay

## 2017-01-24 ENCOUNTER — Telehealth: Payer: Self-pay | Admitting: Family Medicine

## 2017-01-24 ENCOUNTER — Telehealth: Payer: Self-pay | Admitting: *Deleted

## 2017-01-24 NOTE — Telephone Encounter (Signed)
"  Did my physical papers come in?"  Let me check and see if our fax machine is up and running.  We moved our location so the fax has been down.  I'll call you back.

## 2017-01-24 NOTE — Telephone Encounter (Signed)
Patient states it was after her hysterectomy

## 2017-01-24 NOTE — Telephone Encounter (Signed)
Pt called to follow up if her Physical form was filled out and faxed to Podiatrist so she can have surgery? Please advise?  Call pt @ [phone].

## 2017-01-24 NOTE — Telephone Encounter (Signed)
Please advise 

## 2017-01-24 NOTE — Telephone Encounter (Signed)
Please call the patient to see what setting she had her PE in the past? Was it following a surgery? Then I will complete the form and fax. This is the last thing I need to know. Thanks.

## 2017-01-26 ENCOUNTER — Ambulatory Visit: Admission: RE | Admit: 2017-01-26 | Payer: Medicare HMO | Source: Ambulatory Visit

## 2017-01-26 NOTE — Progress Notes (Signed)
Subjective:  Patient presents today for follow-up evaluation of bilateral ankle pain. Patient also has a new complaint of severe posterior heel pain. Patient states that the posterior heel pain is been going on for several months now. She states that there are no alleviating factors. It hurts with shoe gear.   Objective / Physical Exam:  General:  The patient is alert and oriented x3 in no acute distress. Dermatology:  Skin is warm, dry and supple bilateral lower extremities. Negative for open lesions or macerations. Vascular:  Palpable pedal pulses bilaterally. No edema or erythema noted. Capillary refill within normal limits. Neurological:  Epicritic and protective threshold grossly intact bilaterally.  Musculoskeletal Exam:  Pain on palpation to the anterior lateral medial aspects of the patient's bilateral ankle. Mild edema noted. Pain on direct palpation to the insertion of the Achilles tendon right lower extremity. There is a large prominence to the posterior heel consistent with Haglund's deformity. Range of motion within normal limits to all pedal and ankle joints bilateral. Muscle strength 5/5 in all groups bilateral.   Radiographic exam: Large posterior heel spur noted to the posterior tubercle of the right calcaneus. There is also a large prominence noted to the posterior tubercle of the right calcaneus consistent with hypertensive deformity.  Assessment: #1 pain in bilateral ankles with capsulitis and synovitis #2 Haglund's deformity right lower extremity #3 posterior heel spur right  #4 insertional Achilles tendinitis right   Plan of Care:  #1 Patient was evaluated. #2 today we discussed conservative versus surgical management of posterior heel spur and chronic right ankle pain. All conservative modalities have been exhausted. There's been no progression or satisfactory alleviation of symptoms for the patient. The patient opts for surgical correction. All patient questions  were answered. No guarantees were expressed or implied. #3 authorization for surgery initiated today. Surgery will consist of ankle arthroscopic synovectomy right lower extremity. Retrocalcaneal exostectomy right. repair of achilles tendon right. #4 prescription for a knee scooter and walker provided today. #5 return to clinic 1 week postop   Dr. Felecia ShellingBrent M. Evans, DPM [facility]

## 2017-01-27 NOTE — Telephone Encounter (Signed)
We will complete this form with this information and fax to the podiatrist.

## 2017-01-27 NOTE — Telephone Encounter (Signed)
noted 

## 2017-01-27 NOTE — Telephone Encounter (Signed)
I am returning your call.  We have not received your history and physical form yet.  "I actually called them the same day talked to you.  He said the paperwork had been filled out and that they would send it.  I'll give them another call on Monday because I have a pre-op appointment on Friday."

## 2017-01-30 NOTE — Telephone Encounter (Signed)
Patient notified the form is faxed

## 2017-01-30 NOTE — Telephone Encounter (Signed)
Will send a message to Dr Logan BoresEvans advising that the form has been faxed and that the patient will require post operative anticoagulation given history of post operative PE.

## 2017-01-30 NOTE — Telephone Encounter (Signed)
Please advise 

## 2017-01-30 NOTE — Telephone Encounter (Signed)
Pt called and wanted to know if this form is filled out. Her surgeon is asking her for it. Pt asked to be called. Please advise, thank you!  Call pt @ [phone]

## 2017-01-31 ENCOUNTER — Telehealth: Payer: Self-pay | Admitting: *Deleted

## 2017-01-31 NOTE — Telephone Encounter (Signed)
Pt asked if our office had received the Physical form from her PCP, they stated [facility], but she had told them to send to her and [facility]. Pt's PCP is Dr. Marikay AlarEric Sonnenberg - [place].

## 2017-01-31 NOTE — Telephone Encounter (Signed)
"  Have you received my history and physical form yet from my doctor?"  No, we have not received it yet.  I don't know what else to do my pre-op is on Friday.  Can you call them?"  Yes, I will give them a call.  I called and spoke to AllardtJessica.  I asked her to fax us the history and physical form again.  We may not have received it due to our move.  Our fax machine was out of order for a couple of days.  She stated she would fax it to [phone].

## 2017-02-01 MED ORDER — ENOXAPARIN SODIUM 40 MG/0.4ML ~~LOC~~ SOLN: 40.0000 mg | SUBCUTANEOUS | 0 refills | Status: AC

## 2017-02-01 NOTE — Telephone Encounter (Signed)
"  I'm calling to see if you got my forms for surgery?"  Yes, I called them and they sent them.  "You had to call them to get it.  They said they had sent it."  They may have attempted.  We moved so our fax machine was down for a few days.  "Where am I supposed to go for my pre-op?  I have to arrange a ride or cab to pick me up and I have to do that 3 days in advance."  I think you go to same place as surgery, pre-admission.  They should call you with that information.  I am supposed to go on Friday."

## 2017-02-01 NOTE — Telephone Encounter (Signed)
I called and informed patient that Dr. Logan BoresEvans was prescribing her Lovenox per Dr. Birdie SonsSonnenberg to be used after surgery.  I told her that he wants her to bring one with her the day of surgery so someone can show her how to use it.  She stated she already knows how to use it.

## 2017-02-01 NOTE — Addendum Note (Signed)
Addended by: Enedina FinnerMEADOWS, Kees Idrovo J on: 02/01/2017 12:28 PM   Modules accepted: Orders

## 2017-02-03 ENCOUNTER — Encounter (HOSPITAL_COMMUNITY): Payer: Self-pay

## 2017-02-03 ENCOUNTER — Encounter (HOSPITAL_COMMUNITY)
Admission: RE | Admit: 2017-02-03 | Discharge: 2017-02-03 | Disposition: A | Discharge status: Home / Self Care | Payer: Medicare HMO | Source: Ambulatory Visit | Attending: Podiatry | Admitting: Podiatry

## 2017-02-03 DIAGNOSIS — M7661 Achilles tendinitis, right leg: Secondary | ICD-10-CM | POA: Diagnosis not present

## 2017-02-03 DIAGNOSIS — M25571 Pain in right ankle and joints of right foot: Secondary | ICD-10-CM | POA: Insufficient documentation

## 2017-02-03 DIAGNOSIS — Z01812 Encounter for preprocedural laboratory examination: Secondary | ICD-10-CM | POA: Insufficient documentation

## 2017-02-03 DIAGNOSIS — M7731 Calcaneal spur, right foot: Secondary | ICD-10-CM | POA: Diagnosis not present

## 2017-02-03 DIAGNOSIS — R6889 Other general symptoms and signs: Secondary | ICD-10-CM | POA: Diagnosis not present

## 2017-02-03 HISTORY — DX: Cardiac arrhythmia, unspecified: I49.9

## 2017-02-03 HISTORY — DX: Peripheral vascular disease, unspecified: I73.9

## 2017-02-03 HISTORY — DX: Pneumonia, unspecified organism: J18.9

## 2017-02-03 LAB — BASIC METABOLIC PANEL
ANION GAP: 10 (ref 5–15)
BUN: 8 mg/dL (ref 6–20)
CHLORIDE: 106 mmol/L (ref 101–111)
CO2: 26 mmol/L (ref 22–32)
Calcium: 9.6 mg/dL (ref 8.9–10.3)
Creatinine, Ser: 0.84 mg/dL (ref 0.44–1.00)
Glucose, Bld: 95 mg/dL (ref 65–99)
POTASSIUM: 4.6 mmol/L (ref 3.5–5.1)
SODIUM: 142 mmol/L (ref 135–145)

## 2017-02-03 LAB — CBC
HCT: 42.4 % (ref 36.0–46.0)
HEMOGLOBIN: 14.4 g/dL (ref 12.0–15.0)
MCH: 30.5 pg (ref 26.0–34.0)
MCHC: 34 g/dL (ref 30.0–36.0)
MCV: 89.8 fL (ref 78.0–100.0)
PLATELETS: 261 10*3/uL (ref 150–400)
RBC: 4.72 MIL/uL (ref 3.87–5.11)
RDW: 13 % (ref 11.5–15.5)
WBC: 7.6 10*3/uL (ref 4.0–10.5)

## 2017-02-03 NOTE — Pre-Procedure Instructions (Addendum)
Devra DoppCarol S Nistler  02/03/2017      [facility] [phone] - [place] - [address] AT [facility] [address] [place] [id number] Phone: [phone] Fax: [phone]  [facility] [place] - [address] [place] [id number] Phone: [phone] Fax: [phone]    Your procedure is scheduled on 02/09/17  [facility] at 1200 A.M.  Call this number if you have problems the morning of surgery:  [phone]   Remember:  Do not eat food or drink liquids after midnight.  Take these medicines the morning of surgery with A SIP OF WATER  Inhalers if needed, xanax if needed,diltiazem,nexium  STOP all herbel meds, nsaids (aleve,naproxen,advil,ibuprofen)  Starting TODAY 02/03/17   Including all vitamins/supplements,aspirin   Do not wear jewelry, make-up or nail polish.  Do not wear lotions, powders, or perfumes, or deoderant.  Do not shave 48 hours prior to surgery.  Men may shave face and neck.  Do not bring valuables to the hospital.  [facility] is not responsible for any belongings or valuables.  Contacts, dentures or bridgework may not be worn into surgery.  Leave your suitcase in the car.  After surgery it may be brought to your room.  For patients admitted to the hospital, discharge time will be determined by your treatment team.  Patients discharged the day of surgery will not be allowed to drive home.   Special instructions: Special Instructions: [facility] [place] - Preparing for Surgery  Before surgery, you can play an important role.  Because skin is not sterile, your skin needs to be as free of germs as possible.  You can reduce the number of germs on you skin by washing with CHG (chlorahexidine gluconate) soap before surgery.  CHG is an antiseptic cleaner which kills germs and bonds with the skin to continue killing germs even after washing.  Please DO NOT use if  you have an allergy to CHG or antibacterial soaps.  If your skin becomes reddened/irritated stop using the CHG and inform your nurse when you arrive at Short Stay.  Do not shave (including legs and underarms) for at least 48 hours prior to the first CHG shower.  You may shave your face.  Please follow these instructions carefully:   1.  Shower with CHG Soap the night before surgery and the morning of Surgery.  2.  If you choose to wash your hair, wash your hair first as usual with your normal shampoo.  3.  After you shampoo, rinse your hair and body thoroughly to remove the Shampoo.  4.  Use CHG as you would any other liquid soap.  You can apply chg directly  to the skin and wash gently with scrungie or a clean washcloth.  5.  Apply the CHG Soap to your body ONLY FROM THE NECK DOWN.  Do not use on open wounds or open sores.  Avoid contact with your eyes ears, mouth and genitals (private parts).  Wash genitals (private parts)       with your normal soap.  6.  Wash thoroughly, paying special attention to the area where your surgery will be performed.  7.  Thoroughly rinse your body with warm water from the neck down.  8.  DO NOT shower/wash with your normal soap after using and rinsing off the CHG Soap.  9.  Pat yourself  dry with a clean towel.            10.  Wear clean pajamas.            11.  Place clean sheets on your bed the night of your first shower and do not sleep with pets.  Day of Surgery  Do not apply any lotions/deodorants the morning of surgery.  Please wear clean clothes to the hospital/surgery center.  Please read over the  fact sheets that you were given.

## 2017-02-06 ENCOUNTER — Telehealth: Payer: Self-pay | Admitting: Family Medicine

## 2017-02-06 NOTE — Telephone Encounter (Signed)
Last OV 01/16/17 last filled  09/27/16 20rf

## 2017-02-07 NOTE — Telephone Encounter (Signed)
Please see why the patient needs this refilled. This is not something that we typically refill unless there is a reason and if she has nausea she should be evaluated. Thanks.

## 2017-02-08 MED ORDER — ONDANSETRON HCL 4 MG PO TABS: 4.0000 mg | ORAL_TABLET | Freq: Three times a day (TID) | ORAL | 0 refills | Status: DC | PRN

## 2017-02-08 NOTE — Telephone Encounter (Signed)
Noted. Sent to pharmacy.  

## 2017-02-08 NOTE — Telephone Encounter (Signed)
Please advise 

## 2017-02-08 NOTE — Telephone Encounter (Signed)
Pt called requesting a refill on this medication. Pt states that she has been really nauseous and has surgery scheduled for tomorrow. Please advise, thank you!  Pharmacy - [facility] [phone] - [place] - [address] AT [facility] [address]  Call pt @ [phone]

## 2017-02-09 ENCOUNTER — Ambulatory Visit (HOSPITAL_COMMUNITY): Payer: Medicare HMO | Admitting: Certified Registered Nurse Anesthetist

## 2017-02-09 ENCOUNTER — Encounter: Payer: Self-pay | Admitting: Podiatry

## 2017-02-09 ENCOUNTER — Encounter (HOSPITAL_COMMUNITY): Payer: Self-pay | Admitting: *Deleted

## 2017-02-09 ENCOUNTER — Ambulatory Visit (HOSPITAL_COMMUNITY)
Admission: RE | Admit: 2017-02-09 | Discharge: 2017-02-09 | Disposition: A | Discharge status: Home / Self Care | Payer: Medicare HMO | Source: Ambulatory Visit | Attending: Podiatry | Admitting: Podiatry

## 2017-02-09 ENCOUNTER — Encounter (HOSPITAL_COMMUNITY)
Admission: RE | Disposition: A | Discharge status: Home / Self Care | Payer: Self-pay | Source: Ambulatory Visit | Attending: Podiatry

## 2017-02-09 ENCOUNTER — Other Ambulatory Visit: Payer: Self-pay | Admitting: Podiatry

## 2017-02-09 DIAGNOSIS — Z6841 Body Mass Index (BMI) 40.0 and over, adult: Secondary | ICD-10-CM | POA: Insufficient documentation

## 2017-02-09 DIAGNOSIS — M659 Synovitis and tenosynovitis, unspecified: Secondary | ICD-10-CM | POA: Diagnosis not present

## 2017-02-09 DIAGNOSIS — M66871 Spontaneous rupture of other tendons, right ankle and foot: Secondary | ICD-10-CM | POA: Diagnosis not present

## 2017-02-09 DIAGNOSIS — M7731 Calcaneal spur, right foot: Secondary | ICD-10-CM | POA: Diagnosis not present

## 2017-02-09 DIAGNOSIS — M65871 Other synovitis and tenosynovitis, right ankle and foot: Secondary | ICD-10-CM | POA: Diagnosis not present

## 2017-02-09 DIAGNOSIS — K219 Gastro-esophageal reflux disease without esophagitis: Secondary | ICD-10-CM | POA: Insufficient documentation

## 2017-02-09 DIAGNOSIS — J449 Chronic obstructive pulmonary disease, unspecified: Secondary | ICD-10-CM | POA: Diagnosis not present

## 2017-02-09 DIAGNOSIS — F329 Major depressive disorder, single episode, unspecified: Secondary | ICD-10-CM | POA: Diagnosis not present

## 2017-02-09 DIAGNOSIS — F419 Anxiety disorder, unspecified: Secondary | ICD-10-CM | POA: Insufficient documentation

## 2017-02-09 DIAGNOSIS — G8918 Other acute postprocedural pain: Secondary | ICD-10-CM | POA: Diagnosis not present

## 2017-02-09 DIAGNOSIS — I10 Essential (primary) hypertension: Secondary | ICD-10-CM | POA: Diagnosis not present

## 2017-02-09 DIAGNOSIS — Z9889 Other specified postprocedural states: Secondary | ICD-10-CM

## 2017-02-09 HISTORY — PX: ANKLE ARTHROSCOPY: SHX545

## 2017-02-09 HISTORY — PX: CALCANEAL OSTEOTOMY: SHX1281

## 2017-02-09 HISTORY — PX: ACHILLES TENDON SURGERY: SHX542

## 2017-02-09 SURGERY — ARTHROSCOPY, ANKLE
Anesthesia: General | Laterality: Right

## 2017-02-09 MED ORDER — LIDOCAINE 2% (20 MG/ML) 5 ML SYRINGE
INTRAMUSCULAR | Status: AC
  Filled 2017-02-09: qty 5

## 2017-02-09 MED ORDER — ROCURONIUM BROMIDE 50 MG/5ML IV SOSY
PREFILLED_SYRINGE | INTRAVENOUS | Status: AC
  Filled 2017-02-09: qty 5

## 2017-02-09 MED ORDER — FENTANYL CITRATE (PF) 100 MCG/2ML IJ SOLN
100.0000 ug | Freq: Once | INTRAMUSCULAR | Status: AC
  Administered 2017-02-09 (×2): 50 ug via INTRAVENOUS

## 2017-02-09 MED ORDER — MIDAZOLAM HCL 5 MG/5ML IJ SOLN
INTRAMUSCULAR | Status: DC | PRN
  Administered 2017-02-09: 2 mg via INTRAVENOUS

## 2017-02-09 MED ORDER — EPHEDRINE 5 MG/ML INJ
INTRAVENOUS | Status: AC
  Filled 2017-02-09: qty 20

## 2017-02-09 MED ORDER — STERILE WATER FOR IRRIGATION IR SOLN
Status: DC | PRN
  Administered 2017-02-09: 1000 mL

## 2017-02-09 MED ORDER — MIDAZOLAM HCL 2 MG/2ML IJ SOLN
INTRAMUSCULAR | Status: AC
  Filled 2017-02-09: qty 2

## 2017-02-09 MED ORDER — ROPIVACAINE HCL 5 MG/ML IJ SOLN
INTRAMUSCULAR | Status: DC | PRN
  Administered 2017-02-09: 40 mL via PERINEURAL

## 2017-02-09 MED ORDER — FENTANYL CITRATE (PF) 250 MCG/5ML IJ SOLN
INTRAMUSCULAR | Status: AC
  Filled 2017-02-09: qty 5

## 2017-02-09 MED ORDER — PHENYLEPHRINE HCL 10 MG/ML IJ SOLN
INTRAVENOUS | Status: DC | PRN
  Administered 2017-02-09: 25 ug/min via INTRAVENOUS

## 2017-02-09 MED ORDER — FENTANYL CITRATE (PF) 100 MCG/2ML IJ SOLN
INTRAMUSCULAR | Status: AC
  Administered 2017-02-09: 100 ug
  Filled 2017-02-09: qty 2

## 2017-02-09 MED ORDER — EPINEPHRINE PF 1 MG/ML IJ SOLN
INTRAMUSCULAR | Status: AC
  Filled 2017-02-09: qty 1

## 2017-02-09 MED ORDER — BUPIVACAINE HCL (PF) 0.5 % IJ SOLN
INTRAMUSCULAR | Status: AC
  Filled 2017-02-09: qty 30

## 2017-02-09 MED ORDER — PROPOFOL 10 MG/ML IV BOLUS
INTRAVENOUS | Status: AC
  Filled 2017-02-09: qty 40

## 2017-02-09 MED ORDER — LACTATED RINGERS IV SOLN: INTRAVENOUS | Status: DC

## 2017-02-09 MED ORDER — LACTATED RINGERS IV SOLN
INTRAVENOUS | Status: DC | PRN
  Administered 2017-02-09 (×2): via INTRAVENOUS

## 2017-02-09 MED ORDER — EPHEDRINE SULFATE-NACL 50-0.9 MG/10ML-% IV SOSY
PREFILLED_SYRINGE | INTRAVENOUS | Status: DC | PRN
  Administered 2017-02-09: 10 mg via INTRAVENOUS
  Administered 2017-02-09: 5 mg via INTRAVENOUS

## 2017-02-09 MED ORDER — 0.9 % SODIUM CHLORIDE (POUR BTL) OPTIME
TOPICAL | Status: DC | PRN
  Administered 2017-02-09 (×2): 1000 mL

## 2017-02-09 MED ORDER — SODIUM CHLORIDE 0.9 % IR SOLN
Status: DC | PRN
  Administered 2017-02-09: 3000 mL

## 2017-02-09 MED ORDER — HYDROMORPHONE HCL 4 MG PO TABS: 4.0000 mg | ORAL_TABLET | ORAL | 0 refills | Status: DC | PRN

## 2017-02-09 MED ORDER — SCOPOLAMINE 1 MG/3DAYS TD PT72
MEDICATED_PATCH | TRANSDERMAL | Status: AC
  Filled 2017-02-09: qty 1

## 2017-02-09 MED ORDER — MELOXICAM 15 MG PO TABS: 15.0000 mg | ORAL_TABLET | Freq: Every day | ORAL | 1 refills | Status: AC

## 2017-02-09 MED ORDER — FENTANYL CITRATE (PF) 100 MCG/2ML IJ SOLN
INTRAMUSCULAR | Status: DC | PRN
  Administered 2017-02-09 (×5): 50 ug via INTRAVENOUS

## 2017-02-09 MED ORDER — LIDOCAINE HCL (PF) 1 % IJ SOLN
INTRAMUSCULAR | Status: AC
  Filled 2017-02-09: qty 30

## 2017-02-09 MED ORDER — SUCCINYLCHOLINE CHLORIDE 200 MG/10ML IV SOSY
PREFILLED_SYRINGE | INTRAVENOUS | Status: AC
  Filled 2017-02-09: qty 10

## 2017-02-09 MED ORDER — SUGAMMADEX SODIUM 200 MG/2ML IV SOLN
INTRAVENOUS | Status: DC | PRN
  Administered 2017-02-09: 250 mg via INTRAVENOUS

## 2017-02-09 MED ORDER — MIDAZOLAM HCL 2 MG/2ML IJ SOLN
2.0000 mg | Freq: Once | INTRAMUSCULAR | Status: AC
  Administered 2017-02-09: 2 mg via INTRAVENOUS

## 2017-02-09 MED ORDER — EPHEDRINE 5 MG/ML INJ
INTRAVENOUS | Status: AC
  Filled 2017-02-09: qty 10

## 2017-02-09 MED ORDER — SUGAMMADEX SODIUM 200 MG/2ML IV SOLN
INTRAVENOUS | Status: AC
  Filled 2017-02-09: qty 4

## 2017-02-09 MED ORDER — CEFAZOLIN SODIUM-DEXTROSE 2-4 GM/100ML-% IV SOLN
2.0000 g | INTRAVENOUS | Status: AC
  Administered 2017-02-09: 2 g via INTRAVENOUS
  Filled 2017-02-09: qty 100

## 2017-02-09 MED ORDER — ONDANSETRON HCL 4 MG/2ML IJ SOLN
INTRAMUSCULAR | Status: DC | PRN
  Administered 2017-02-09: 4 mg via INTRAVENOUS

## 2017-02-09 MED ORDER — ONDANSETRON HCL 4 MG/2ML IJ SOLN
INTRAMUSCULAR | Status: AC
  Filled 2017-02-09: qty 2

## 2017-02-09 MED ORDER — DEXAMETHASONE SODIUM PHOSPHATE 10 MG/ML IJ SOLN
INTRAMUSCULAR | Status: AC
  Filled 2017-02-09: qty 1

## 2017-02-09 MED ORDER — PHENYLEPHRINE 40 MCG/ML (10ML) SYRINGE FOR IV PUSH (FOR BLOOD PRESSURE SUPPORT)
PREFILLED_SYRINGE | INTRAVENOUS | Status: AC
  Filled 2017-02-09: qty 10

## 2017-02-09 MED ORDER — ROCURONIUM BROMIDE 10 MG/ML (PF) SYRINGE
PREFILLED_SYRINGE | INTRAVENOUS | Status: DC | PRN
  Administered 2017-02-09: 50 mg via INTRAVENOUS
  Administered 2017-02-09: 20 mg via INTRAVENOUS

## 2017-02-09 MED ORDER — CHLORHEXIDINE GLUCONATE 4 % EX LIQD: 60.0000 mL | Freq: Once | CUTANEOUS | Status: DC

## 2017-02-09 MED ORDER — SUCCINYLCHOLINE CHLORIDE 200 MG/10ML IV SOSY
PREFILLED_SYRINGE | INTRAVENOUS | Status: DC | PRN
  Administered 2017-02-09: 140 mg via INTRAVENOUS

## 2017-02-09 MED ORDER — LIDOCAINE HCL 2 % IJ SOLN
INTRAMUSCULAR | Status: AC
  Filled 2017-02-09: qty 20

## 2017-02-09 MED ORDER — LIDOCAINE 2% (20 MG/ML) 5 ML SYRINGE
INTRAMUSCULAR | Status: DC | PRN
  Administered 2017-02-09: 100 mg via INTRAVENOUS

## 2017-02-09 MED ORDER — SCOPOLAMINE 1 MG/3DAYS TD PT72
1.0000 | MEDICATED_PATCH | TRANSDERMAL | Status: DC
  Administered 2017-02-09: 1.5 mg via TRANSDERMAL
  Filled 2017-02-09: qty 1

## 2017-02-09 MED ORDER — PROPOFOL 10 MG/ML IV BOLUS
INTRAVENOUS | Status: DC | PRN
  Administered 2017-02-09: 160 mg via INTRAVENOUS

## 2017-02-09 MED ORDER — MELOXICAM 15 MG PO TABS: 15.0000 mg | ORAL_TABLET | Freq: Every day | ORAL | 1 refills | Status: DC

## 2017-02-09 SURGICAL SUPPLY — 85 items
ANCHOR CORKSCREW 3.5 FIBERWIRE (Anchor) ×6 IMPLANT
BANDAGE ACE 3X5.8 VEL STRL LF (GAUZE/BANDAGES/DRESSINGS) ×3 IMPLANT
BANDAGE ACE 4X5 VEL STRL LF (GAUZE/BANDAGES/DRESSINGS) ×3 IMPLANT
BLADE AVERAGE 25MMX9MM (BLADE)
BLADE AVERAGE 25X9 (BLADE) IMPLANT
BLADE RECIPRO TAPERED (BLADE) ×3 IMPLANT
BLADE SURG 15 STRL LF DISP TIS (BLADE) ×3 IMPLANT
BLADE SURG 15 STRL SS (BLADE) ×6
BNDG CONFORM 2 STRL LF (GAUZE/BANDAGES/DRESSINGS) ×3 IMPLANT
BNDG ESMARK 4X9 LF (GAUZE/BANDAGES/DRESSINGS) ×6 IMPLANT
BNDG GAUZE ELAST 4 BULKY (GAUZE/BANDAGES/DRESSINGS) ×3 IMPLANT
BUR CUDA 2.9 (BURR) IMPLANT
BUR CUDA 2.9MM (BURR)
BUR FULL RADIUS 2.9 (BURR) ×2 IMPLANT
BUR FULL RADIUS 2.9MM (BURR) ×1
BUR GATOR 2.9 (BURR) ×2 IMPLANT
BUR GATOR 2.9MM (BURR) ×1
BUR SPHERICAL 2.9 (BURR) IMPLANT
BUR SPHERICAL 2.9MM (BURR)
CHLORAPREP W/TINT 26ML (MISCELLANEOUS) ×3 IMPLANT
CLOSURE STERI-STRIP 1/2X4 (GAUZE/BANDAGES/DRESSINGS) ×2
CLSR STERI-STRIP ANTIMIC 1/2X4 (GAUZE/BANDAGES/DRESSINGS) ×4 IMPLANT
COVER SURGICAL LIGHT HANDLE (MISCELLANEOUS) ×3 IMPLANT
CUFF TOURNIQUET SINGLE 34IN LL (TOURNIQUET CUFF) ×3 IMPLANT
DRAPE EXTREMITY TIBURON (DRAPES) ×3 IMPLANT
DRAPE OEC MINIVIEW 54X84 (DRAPES) ×6 IMPLANT
DRAPE SURG 17X23 STRL (DRAPES) ×3 IMPLANT
DRAPE U-SHAPE 47X51 STRL (DRAPES) ×3 IMPLANT
DRSG PAD ABDOMINAL 8X10 ST (GAUZE/BANDAGES/DRESSINGS) ×3 IMPLANT
DURAPREP 26ML APPLICATOR (WOUND CARE) ×6 IMPLANT
ELECT REM PT RETURN 9FT ADLT (ELECTROSURGICAL) ×6
ELECTRODE REM PT RTRN 9FT ADLT (ELECTROSURGICAL) ×2 IMPLANT
GAUZE SPONGE 4X4 12PLY STRL (GAUZE/BANDAGES/DRESSINGS) ×3 IMPLANT
GAUZE XEROFORM 1X8 LF (GAUZE/BANDAGES/DRESSINGS) ×3 IMPLANT
GLOVE BIO SURGEON STRL SZ7 (GLOVE) ×6 IMPLANT
GLOVE BIO SURGEON STRL SZ7.5 (GLOVE) IMPLANT
GLOVE BIO SURGEON STRL SZ8 (GLOVE) ×3 IMPLANT
GLOVE BIOGEL PI IND STRL 6.5 (GLOVE) ×2 IMPLANT
GLOVE BIOGEL PI IND STRL 8 (GLOVE) ×2 IMPLANT
GLOVE BIOGEL PI INDICATOR 6.5 (GLOVE) ×4
GLOVE BIOGEL PI INDICATOR 8 (GLOVE) ×4
GOWN STRL REUS W/ TWL LRG LVL3 (GOWN DISPOSABLE) ×2 IMPLANT
GOWN STRL REUS W/ TWL XL LVL3 (GOWN DISPOSABLE) ×2 IMPLANT
GOWN STRL REUS W/TWL LRG LVL3 (GOWN DISPOSABLE) ×4
GOWN STRL REUS W/TWL XL LVL3 (GOWN DISPOSABLE) ×4
GUIDEWIRE ORTH 6X062XTROC NS (WIRE) ×1 IMPLANT
IV NS IRRIG 3000ML ARTHROMATIC (IV SOLUTION) ×3 IMPLANT
K-WIRE .062 (WIRE) ×2
KIT BASIN OR (CUSTOM PROCEDURE TRAY) IMPLANT
KIT ROOM TURNOVER OR (KITS) ×3 IMPLANT
MANIFOLD NEPTUNE II (INSTRUMENTS) ×3 IMPLANT
NDL SAFETY ECLIPSE 18X1.5 (NEEDLE) IMPLANT
NEEDLE HYPO 18GX1.5 SHARP (NEEDLE)
NEEDLE HYPO 25GX1X1/2 BEV (NEEDLE) ×3 IMPLANT
NEEDLE HYPO 25X1 1.5 SAFETY (NEEDLE) ×3 IMPLANT
NS IRRIG 1000ML POUR BTL (IV SOLUTION) ×3 IMPLANT
PACK ORTHO EXTREMITY (CUSTOM PROCEDURE TRAY) ×6 IMPLANT
PAD ARMBOARD 7.5X6 YLW CONV (MISCELLANEOUS) ×6 IMPLANT
PAD CAST 4YDX4 CTTN HI CHSV (CAST SUPPLIES) ×1 IMPLANT
PADDING CAST ABS 4INX4YD NS (CAST SUPPLIES) ×4
PADDING CAST ABS COTTON 4X4 ST (CAST SUPPLIES) ×2 IMPLANT
PADDING CAST COTTON 4X4 STRL (CAST SUPPLIES) ×2
SCRUB BETADINE 4OZ XXX (MISCELLANEOUS) ×3 IMPLANT
SET ARTHROSCOPY TUBING (MISCELLANEOUS)
SET ARTHROSCOPY TUBING LN (MISCELLANEOUS) IMPLANT
SET IRRIG Y TYPE TUR BLADDER L (SET/KITS/TRAYS/PACK) IMPLANT
SOLUTION BETADINE 4OZ (MISCELLANEOUS) ×3 IMPLANT
SPLINT FIBERGLASS 4X30 (CAST SUPPLIES) IMPLANT
STAPLER VISISTAT 35W (STAPLE) IMPLANT
STOCKINETTE IMPERVIOUS LG (DRAPES) ×3 IMPLANT
STRAP ANKLE FOOT DISTRACTOR (ORTHOPEDIC SUPPLIES) IMPLANT
SUT ETHILON 4 0 PS 2 18 (SUTURE) IMPLANT
SUT MNCRL AB 3-0 PS2 18 (SUTURE) IMPLANT
SUT MNCRL AB 4-0 PS2 18 (SUTURE) IMPLANT
SUT MON AB 5-0 PS2 18 (SUTURE) IMPLANT
SUT PROLENE 4-0 FS-2 (SUTURE) ×6 IMPLANT
SUT VICRYL 4-0 PS2 18IN ABS (SUTURE) ×6 IMPLANT
SYR 10ML LL (SYRINGE) IMPLANT
SYR CONTROL 10ML LL (SYRINGE) ×3 IMPLANT
TOWEL OR 17X24 6PK STRL BLUE (TOWEL DISPOSABLE) ×3 IMPLANT
TOWEL OR 17X26 10 PK STRL BLUE (TOWEL DISPOSABLE) ×3 IMPLANT
TUBE CONNECTING 20'X1/4 (TUBING) ×1
TUBE CONNECTING 20X1/4 (TUBING) ×2 IMPLANT
UNDERPAD 30X30 (UNDERPADS AND DIAPERS) ×3 IMPLANT
WATER STERILE IRR 1000ML POUR (IV SOLUTION) ×3 IMPLANT

## 2017-02-09 NOTE — Anesthesia Procedure Notes (Addendum)
Anesthesia Regional Block: Popliteal block   Pre-Anesthetic Checklist: ,, timeout performed, Correct Patient, Correct Site, Correct Laterality, Correct Procedure, Correct Position, site marked, Risks and benefits discussed,  Surgical consent,  Pre-op evaluation,  At surgeon's request and post-op pain management  Laterality: Right  Prep: chloraprep       Needles:  Injection technique: Single-shot  Needle Type: Echogenic Stimulator Needle          Additional Needles:   Procedures: ultrasound guided,,,,,,,,  Narrative:  Start time: 02/09/2017 2:38 PM End time: 02/09/2017 2:48 PM Injection made incrementally with aspirations every 5 mL.  Performed by: Personally  Anesthesiologist: Heather RobertsSINGER, Travelle Mcclimans  Additional Notes: A functioning IV was confirmed and monitors were applied.  Sterile prep and drape, hand hygiene and sterile gloves were used.  Negative aspiration and test dose prior to incremental administration of local anesthetic. The patient tolerated the procedure well.Ultrasound  guidance: relevant anatomy identified, needle position confirmed, local anesthetic spread visualized around nerve(s), vascular puncture avoided.  Image printed for medical record. ADC block added

## 2017-02-09 NOTE — Anesthesia Procedure Notes (Signed)
Procedure Name: Intubation Date/Time: 02/09/2017 5:06 PM Performed by: Annabelle HarmanSMITH, Rebecca Sandy A Pre-anesthesia Checklist: Patient identified, Emergency Drugs available, Suction available and Patient being monitored Patient Re-evaluated:Patient Re-evaluated prior to inductionOxygen Delivery Method: Circle system utilized Preoxygenation: Pre-oxygenation with 100% oxygen Intubation Type: IV induction and Rapid sequence Laryngoscope Size: Miller and 2 Grade View: Grade I Tube type: Oral Tube size: 7.5 mm Number of attempts: 1 Airway Equipment and Method: Stylet Placement Confirmation: ETT inserted through vocal cords under direct vision,  positive ETCO2 and breath sounds checked- equal and bilateral Secured at: 23 cm Tube secured with: Tape Dental Injury: Teeth and Oropharynx as per pre-operative assessment

## 2017-02-09 NOTE — Transfer of Care (Signed)
Immediate Anesthesia Transfer of Care Note  Patient: Devra DoppCarol S Swing  Procedure(s) Performed: Procedure(s): ANKLE ARTHROSCOPY RIGHT FOOT (Right) ACHILLES TENDON REPAIR (Right) RETRO CALCANEOUS EXOSTECTOMY (Right)  Patient Location: PACU  Anesthesia Type:General  Level of Consciousness: awake, alert , oriented and patient cooperative  Airway & Oxygen Therapy: Patient Spontanous Breathing and Patient connected to nasal cannula oxygen  Post-op Assessment: Report given to RN and Post -op Vital signs reviewed and stable  Post vital signs: Reviewed and stable  Last Vitals:  Vitals:   02/09/17 1455 02/09/17 1929  BP: (!) 157/74 (!) 143/77  Pulse: 65 71  Resp: 13 (!) 22  Temp:  36.3 C    Last Pain:  Vitals:   02/09/17 1929  TempSrc:   PainSc: 0-No pain      Patients Stated Pain Goal: 9 (02/09/17 1142)  Complications: No apparent anesthesia complications

## 2017-02-09 NOTE — Anesthesia Preprocedure Evaluation (Addendum)
Anesthesia Evaluation  Patient identified by MRN, date of birth, ID band Patient awake    Reviewed: Allergy & Precautions, NPO status , Patient's Chart, lab work & pertinent test results  History of Anesthesia Complications Negative for: history of anesthetic complications  Airway Mallampati: II  TM Distance: >3 FB Neck ROM: Full    Dental no notable dental hx. (+) Dental Advisory Given, Missing,    Pulmonary COPD,  COPD inhaler,    Pulmonary exam normal breath sounds clear to auscultation       Cardiovascular hypertension, Normal cardiovascular exam+ dysrhythmias  Rhythm:Regular Rate:Normal  Study Conclusions  - Left ventricle: The cavity size was normal. Systolic function was   normal. The estimated ejection fraction was in the range of 60%   to 65%. Wall motion was normal; there were no regional wall   motion abnormalities.    Neuro/Psych  Headaches, PSYCHIATRIC DISORDERS Anxiety Depression    GI/Hepatic GERD  Medicated and Controlled,  Endo/Other  Morbid obesity  Renal/GU negative Renal ROS     Musculoskeletal   Abdominal   Peds  Hematology   Anesthesia Other Findings   Reproductive/Obstetrics                           Anesthesia Physical Anesthesia Plan  ASA: III  Anesthesia Plan: General   Post-op Pain Management: GA combined w/ Regional for post-op pain   Induction: Intravenous  Airway Management Planned: LMA  Additional Equipment:   Intra-op Plan:   Post-operative Plan: Extubation in OR  Informed Consent: I have reviewed the patients History and Physical, chart, labs and discussed the procedure including the risks, benefits and alternatives for the proposed anesthesia with the patient or authorized representative who has indicated his/her understanding and acceptance.   Dental advisory given  Plan Discussed with: CRNA, Anesthesiologist and Surgeon  Anesthesia Plan  Comments:         Anesthesia Quick Evaluation

## 2017-02-09 NOTE — Progress Notes (Signed)
Post surgical medication management.   Yilia Sacca M. Paulette [place]ford, DPM [facility]  Dr. Jamile Rekowski M. Glendora Clouatre, DPM    [address]                                        [place], [place] [address]                Office [phone]  Fax [phone]     

## 2017-02-09 NOTE — Anesthesia Postprocedure Evaluation (Signed)
Anesthesia Post Note  Patient: Rebecca Ramos  Procedure(s) Performed: Procedure(s) (LRB): ANKLE ARTHROSCOPY RIGHT FOOT (Right) ACHILLES TENDON REPAIR (Right) RETRO CALCANEOUS EXOSTECTOMY (Right)  Patient location during evaluation: PACU Anesthesia Type: General Level of consciousness: awake and alert Pain management: pain level controlled Vital Signs Assessment: post-procedure vital signs reviewed and stable Respiratory status: spontaneous breathing, nonlabored ventilation, respiratory function stable and patient connected to nasal cannula oxygen Cardiovascular status: blood pressure returned to baseline and stable Postop Assessment: no signs of nausea or vomiting Anesthetic complications: no       Last Vitals:  Vitals:   02/09/17 1929 02/09/17 1945  BP: (!) 143/77 (!) 146/89  Pulse: 71 65  Resp: (!) 22 12  Temp: 36.3 C 36.3 C    Last Pain:  Vitals:   02/09/17 1945  TempSrc:   PainSc: 0-No pain                 Nivia Gervase DAVID

## 2017-02-14 ENCOUNTER — Encounter: Payer: Medicare HMO | Admitting: Podiatry

## 2017-02-14 ENCOUNTER — Telehealth: Payer: Self-pay | Admitting: Podiatry

## 2017-02-14 ENCOUNTER — Encounter (HOSPITAL_COMMUNITY): Payer: Self-pay | Admitting: Podiatry

## 2017-02-14 NOTE — Telephone Encounter (Signed)
Patient called this morning about 11:00 stating that she had surgery with Dr. Logan Bores on Thursday and that she had fallen 2 times this morning trying to get to the bathroom and the last time she fell she felt a pop in the back of her heel and now is experiencing burning pain and bleeding thru the bandaging.  Per Dr. Logan Bores he would work her in at 1:30 today because she said that she would have to get transportation here. I went ahead and set her an appt at 1:30 per Dr. Logan Bores.  Patient had not arrived by 3:30 so I called her back and she said that she wasn't able to get a ride to the office to be seen. She was still trying to contact her 64 yr old father to bring her. I advised her that the office closes at 5:00 pm and we would not have a doctor on staff tomorrow to see her that she would either have to go to the ER for treatment or see Dr. Logan Bores in [facility] tomorrow.

## 2017-02-15 ENCOUNTER — Ambulatory Visit (INDEPENDENT_AMBULATORY_CARE_PROVIDER_SITE_OTHER): Payer: Medicare HMO | Admitting: Podiatry

## 2017-02-15 ENCOUNTER — Ambulatory Visit (INDEPENDENT_AMBULATORY_CARE_PROVIDER_SITE_OTHER): Payer: Medicare HMO

## 2017-02-15 VITALS — Temp 97.9°F | Resp 16

## 2017-02-15 DIAGNOSIS — M79671 Pain in right foot: Secondary | ICD-10-CM

## 2017-02-15 DIAGNOSIS — Z9889 Other specified postprocedural states: Secondary | ICD-10-CM

## 2017-02-15 MED ORDER — HYDROMORPHONE HCL 4 MG PO TABS: 4.0000 mg | ORAL_TABLET | ORAL | 0 refills | Status: DC | PRN

## 2017-02-15 NOTE — Progress Notes (Signed)
Subjective: Patient presents today status post ankle arthroscopy with Achilles tendon repair and retrocalcaneal exostectomy to the right lower extremity. Date of surgery 02/10/2017. Patient states that she fell while hopping on her foot on 02/14/2017. Patient states that she immediately noticed popping with severe pain and tenderness and swelling to the right lower extremity. Patient presents today for further treatment and evaluation.  Objective: Significant amount of ecchymosis and swelling noted throughout the ankle and posterior heel region right lower extremity. Sutures are intact. Radiographic exam indicates that the bone anchors are intact. There does appear to be some Achilles tendon soft tissue disruption however we're going to manage it conservatively at the moment.  Assessment: Status post retrocalcaneal exostectomy with Achilles tendon repair right. Ankle arthroscopy right. Date of surgery 02/10/2017.  Plan of care: Today the patient was evaluated. X-rays were reviewed. Refill prescription for Dilaudid 4 mg. Dressings were changed. Continue nonweightbearing in the immobilization cam boot with a heel lift right lower extremity. Return to clinic in 1 week.  Felecia Shelling, DPM [facility]  Dr. Felecia Shelling, DPM    [address]                                        [place], [place] [address]                Office [phone]  Fax [phone]

## 2017-02-17 ENCOUNTER — Encounter: Payer: Medicare HMO | Admitting: Podiatry

## 2017-02-17 NOTE — Brief Op Note (Signed)
02/09/2017  6:47 AM  PATIENT:  Rebecca Ramos  56 y.o. female  PRE-OPERATIVE DIAGNOSIS:  SYNOVITIS OF RIGHT ANKLE   POST-OPERATIVE DIAGNOSIS:  SYNOVITIS OF RIGHT ANKLE  PROCEDURE:  Procedure(s): ANKLE ARTHROSCOPY RIGHT FOOT (Right) ACHILLES TENDON REPAIR (Right) RETRO CALCANEOUS EXOSTECTOMY (Right)  SURGEON:  Surgeon(s) and Role:    * Felecia Shelling, DPM - Primary  PHYSICIAN ASSISTANT:   ASSISTANTS: none    ANESTHESIA:   regional and general  EBL:  No intake/output data recorded.  BLOOD ADMINISTERED:none  DRAINS: none   LOCAL MEDICATIONS USED:  MARCAINE    and LIDOCAINE   SPECIMEN:  No Specimen  DISPOSITION OF SPECIMEN:  N/A  COUNTS:  YES  TOURNIQUET:   Total Tourniquet Time Documented: Thigh (Right) - 30 minutes Thigh (Right) - 48 minutes Total: Thigh (Right) - 78 minutes   DICTATION: .Reubin Milan Dictation  PLAN OF CARE: Discharge to home after PACU  PATIENT DISPOSITION:  PACU - hemodynamically stable.   Delay start of Pharmacological VTE agent (>24hrs) due to surgical blood loss or risk of bleeding: not applicable

## 2017-02-17 NOTE — Op Note (Signed)
02/09/2017  6:47 AM  PATIENT:  Rebecca Ramos  56 y.o. female  PRE-OPERATIVE DIAGNOSIS:  SYNOVITIS OF RIGHT ANKLE     POST-OPERATIVE DIAGNOSIS:  SYNOVITIS OF RIGHT ANKLE  PROCEDURE:  Procedure(s): ANKLE ARTHROSCOPY RIGHT FOOT (Right) ACHILLES TENDON REPAIR (Right) RETRO CALCANEOUS EXOSTECTOMY (Right)  SURGEON:  Surgeon(s) and Role:    * Felecia Shelling, DPM - Primary  PHYSICIAN ASSISTANT:   ASSISTANTS: none     ANESTHESIA:   regional and general  EBL:  No intake/output data recorded.  BLOOD ADMINISTERED:none  DRAINS: none   LOCAL MEDICATIONS USED:  MARCAINE    and LIDOCAINE   SPECIMEN:  No Specimen  DISPOSITION OF SPECIMEN:  N/A  COUNTS:  YES  TOURNIQUET:   Total Tourniquet Time Documented: Thigh (Right) - 30 minutes Thigh (Right) - 48 minutes Total: Thigh (Right) - 78 minutes  MATERIALS: Arthrex 3.9mm anchor x 2  JUSTIFICATION FOR PROCEDURE: Patient is a 56 year old female who presents to the [facility] OR for surgical correction of chronic ankle pain to the right lower extremity as well as a symptomatic retrocalcaneal exostosis right lower extremity with degenerativeAchilles tendinosis. All CONSERVATIVE modalities and felt in providing any sort of satisfactory alleviation of symptoms for the patient. The patient was told benefits as well as possible side effects of the procedure. The patient consented for surgical correction. The patient consent form was reviewed. All patient questions were answered. No guarantees were expressed or implied. The patient and the surgeon boson the patient consent form with the witness present and placed in the patient's chart.   PROCEDURE IN DETAIL: The patient was brought to the operating room placed the operating table in a supine position at which time an aseptic scrub and drape were performed about the patient's right lower extremity after anesthesia was induced as described above. The right ankle tourniquet which  was used for hemostasis was inflated and attention was directed to the anterior aspect of the patient's right ankle where procedure #1 commenced.  Procedure #1: Ankle arthroscopic synovectomy right 0.5 cm incision was planned and made about the anterior medial aspect of the patient's right ankle joint just medial to the tibialis anterior tendon. After the small incision was made blunt dissection was utilized to penetrate ankle joint capsule. At this time a 2.7 mm arthroscope with cannula was inserted into the right ankle and all soft tissue adhesions and synovitis was visualized. A sweeping motion was utilized to visualize the ankle joint and another anterior lateral incision was made in the same manner as previously mentioned. After perforating the anterior lateral portal joint capsule. 2.9 mm resector was utilized to resect all soft tissue adhesions and synovitis within the ankle joint of the anterior lateral aspect of the right ankle. Sweeping motion was utilized to visualize the AITFL and arthroscopic debridement was performed of all soft tissue and impingement lesions on the anterior lateral portion. Imaging was utilized to visualize the lateral gutter to ensure that there was no soft tissue adhesions. Cartilaginous surfaces appeared to be healthy and intact. Impingement patient was removed and the arthroscope was placed in the anterior lateral portion with the shaver in the anterior medial portion and again using a sweeping motion the shaver was utilized to resect the way all soft tissue adhesions and impingement lesions about the anterior medial portion of the right ankle. Again arthroscopic sweeping motion was utilized to visualize the ankle joint and ensure that there was no impingement lesion or soft tissue adhesions contributing to the  patient's ankle joint symptoms. All instrumentation was then removed and 4-0 Prolene suture was utilized to reapproximate superficial skin edges of the small incision  sites.  Procedure #2: Retrocalcaneal exostectomy right lower extremity The patient was then placed in a prone position and in a skilled septic scrub was again utilized to create a sterile field which time a 5 cm posterior heel incision was made transversely across the back of the heel overlying the palpable exostosis of the calcaneus. Incision was carried down to the level of Achilles tendon with care taken to cut clamped ligated or retracted way all small neurovascular structures traversing the incision sites. This time a transverse tenotomy was performed and the Achilles tendon to expose the underlying posterior heel spur. A #38 blade mounted on a sagittal saw was utilized to resect away the posterior heel spur and a power rasp was utilized to smooth all bony contours and protuberances. Intraoperative x-ray fluoroscopy was utilized to visualize the reduction of the posterior heel spur which was satisfactory. Copious irrigation was then utilized and attention was directed to the Achilles tendon where procedure #3 commenced. Please note that the medial and lateral portions of the Achilles tendon were left intact. All of the central band of the Achilles tendon was released to expose the underlying posterior heel spur.  Procedure #3: Achilles tendon right lower extremity Attention was then directed to the Achilles tendon of the right lower extremity where it was inspected and all degenerative Achilles tendon tissue was sharply dissected. There were also intratendinous calcific bodies that were sharply dissected away. At this time Arthrex 3.5 mm bone anchors 2 was utilized to reattach the central portion of the Achilles tendon to the posterior heel. The anchors were and inserted in the standard AO fashion and the Achilles tendon was reattached the posterior heel with the foot in a slightly plantarflexed position. 2-0 and 3-0 Vicryl was also utilized to reinforce the reapproximation of the Achilles tendon to the  posterior heel.  The incision site was again copiously irrigated with normal saline and dried in preparation for routine layered soft tissue closure. 4-0 Vicryl sutures utilized to approximate subcutaneous change tissue followed by 4-0 Prolene suture to reapproximate superficial skin edges.  Dry sterile compressive dressings were then applied to all previously mentioned incision sites about the patient's right lower extremity. The right ankle tourniquet which was used for hemostasis was deflated. All normal neurovascular pulses including pink color and warmth returned all the digits of the patient's right lower extremity. The patient was then transferred from the operating room to the recovery room having tolerated the procedure and anesthesia well. All vital signs are stable. After brief stay in the recovery room the patient was discharged to her home with prescriptions for adequate analgesic and anti-inflammatory care for at home use. Verbal as well as written instructions were provided for the patient regarding wound care. The patient is to keep the dressings clean dry and intact until she is to follow surgeon Dr. Gala Lewandowsky in the office next week. Strict nonweightbearing in an immobilization cam boot with a heel lift.  PLAN OF CARE: Discharge to home after PACU  PATIENT DISPOSITION:  PACU - hemodynamically stable.   Felecia Shelling, DPM [facility]  Dr. Felecia Shelling, DPM    [address]  [place], [place] [address]                Office [phone]  Fax [phone]

## 2017-02-17 NOTE — Progress Notes (Signed)
This encounter was created in error - please disregard.

## 2017-02-17 NOTE — H&P (Signed)
  Please see history and physical note provided by the patient's primary care physician to proceed with outpatient surgical intervention today. Scan performed on 02/15/2017. Date of service 02/09/2017.  Felecia Shelling, DPM [facility]  Dr. Felecia Shelling, DPM    [address]                                        [place], [place] [address]                Office [phone]  Fax [phone]

## 2017-02-24 ENCOUNTER — Ambulatory Visit (INDEPENDENT_AMBULATORY_CARE_PROVIDER_SITE_OTHER): Payer: Medicare HMO | Admitting: Podiatry

## 2017-02-24 DIAGNOSIS — Z9889 Other specified postprocedural states: Secondary | ICD-10-CM

## 2017-02-24 DIAGNOSIS — M25571 Pain in right ankle and joints of right foot: Secondary | ICD-10-CM

## 2017-02-24 MED ORDER — HYDROMORPHONE HCL 4 MG PO TABS: 4.0000 mg | ORAL_TABLET | ORAL | 0 refills | Status: DC | PRN

## 2017-02-24 MED ORDER — GENTAMICIN SULFATE 0.1 % EX CREA: 1.0000 "application " | TOPICAL_CREAM | Freq: Three times a day (TID) | CUTANEOUS | 0 refills | Status: DC

## 2017-02-24 MED ORDER — DOXYCYCLINE HYCLATE 100 MG PO TABS: 100.0000 mg | ORAL_TABLET | Freq: Two times a day (BID) | ORAL | 0 refills | Status: DC

## 2017-02-27 ENCOUNTER — Telehealth: Payer: Self-pay

## 2017-02-27 ENCOUNTER — Telehealth: Payer: Self-pay | Admitting: *Deleted

## 2017-02-27 DIAGNOSIS — R609 Edema, unspecified: Secondary | ICD-10-CM

## 2017-02-27 DIAGNOSIS — Z9889 Other specified postprocedural states: Secondary | ICD-10-CM

## 2017-02-27 NOTE — Progress Notes (Signed)
Subjective: Patient presents today status post ankle arthroscopy with Achilles tendon repair and retrocalcaneal exostectomy to the right lower extremity. Date of surgery 02/09/17. She complains of pain and a burning sensation in the right calf for the past 3-4 days. She states her right leg is a aching. She reports continued pain and swelling in the right ankle/foot. Patient presents today for further treatment and evaluation.  Objective: Significant amount of ecchymosis and swelling noted throughout the ankle and posterior heel region right lower extremity. Sutures are intact. Pain with palpation to the right calf.   Assessment: Status post retrocalcaneal exostectomy with Achilles tendon repair right. Ankle arthroscopy right. Date of surgery 02/10/2017. Possible DVT to the RLE.  Plan of care: Today the patient was evaluated. Dressing changed. Dilaudid 4 mg prescription refilled. Order venous Doppler study. Order for home health care 3 times a week for dressing changes. Prescription for gentamicin cream and prescription for doxycycline provided. Return to clinic in 1 week.  Felecia Shelling, DPM [facility]  Dr. Felecia Shelling, DPM    [address]                                        [place], [place] [address]                Office [phone]  Fax [phone]

## 2017-02-27 NOTE — Telephone Encounter (Signed)
PA started on cover my meds for twice daily dosing of Nexium.

## 2017-02-27 NOTE — Telephone Encounter (Addendum)
----- Message from Constance Haw, LPN sent at 1/61/0960  1:54 PM EDT ----- Dr. Logan Bores request  Stat Venous doppler rt leg and to start home health for wound care dressing changes 3xw.  Apply gentamycin cream to incision then dress with sterile gauze, kerlix and ace bandage. 02/27/2017-Orders faxed to [facility]. Faxed wound care orders to [facility].03/02/2017-Left message on pt's mobile informing pt of Chip Boer [phone]. I spoke with pt and gave [facility] phone and she states the nurse has not arrived and told her to call Chip Boer and let them know. Pt states the home health nurse came and her whole heel is opened up and the heel is coming office. I spoke with Dr. Logan Bores and he states he will see pt at her scheduled appt tomorrow, keep area dressed and clean and dry. I informed pt.03/09/2017-Sherri Delena Serve - [facility] called for orders for PT and OT for pt to increase stability of postop ambulation since she is in a cast and does not need wound care.03/10/2017-Pt's boyfriend Ed Clelia Croft left message at call center stating pt has not slept yet due to severe pain. I spoke with Carl Best and he states pt has not slept since surgery due to pain and is vomiting which started before starting the pain medication. I told Carl Best that I would need to speak with pt and see if she needed to be seen by Dr. Logan Bores today. Left message on [phone] the phone number given by Carl Best, that I needed pt to call me to discuss pain. Left message on pt's other listed mobile phone to call to discuss pain. I spoke with pt, she states she has a severe dull pain in the heel where it bends, and doesn't want dilaudid, but thinks she could get more relief if she was able take 2 percocet 5/325mg  just today. Pt states she had to chew the last zofran to help with the nausea. I spoke with Dr. Logan Bores and he states okay to take Percocet 5/325 2 tablets every 6 hours and can take Tramadol  1 tablet every 6 hours #30 between dosing of the Percocet. I  informed pt and she states understanding. I told pt to eat light meals with the medications if possible and to call with concerns. Pt states understanding. Left message informing Ladonna Snide - [facility] that Dr. Logan Bores had okayed PT and OT for pt.03/15/2017-Amy McLouth, PT - [facility] states she is going to need orders for PT 1 x week for 1 wk, 2 x week for 1 wk, and how long is pt to be non-weight bearing and when can they start ROM. 03/16/2017-Amy Moon [facility] states needs approval for PT and pt would like to know when can weight bear.I told pt Dr. Logan Bores was really wanting pt to have post-op gait and balance training to help her remain safe while non-weight bearing, and that he would be more able to determine when she would weight bear at each post op evaluation. Amy states pt really need the gait and balance training and is doing good hopping around with the walker, but needs PT to teach how to get in and out of her house and she has just a shower stall, that OT will help with how to manage getting in and out of it. I Mat Carne, PT - [facility] PT and OT orders. 03/17/2017-I informed Amy [facility] PT that pt would be none weight bearing for 4-6 weeks. 03/20/2017-Lisa Manson Passey, OT - [facility] states pt says she is only  to weight bear to go to the bathroom, and Misty Stanley, OT states she is uncertain of how to help pt at this point. Misty Stanley, OT states pt fell hopping with the walker.I spoke with Dr. Logan Bores ordered a knee scooter for ambulation. Misty Stanley, OT states she feels that will help pt and she will see her 4 times to make sure she is safe and get cleaned up. Orders to [facility] for knee scooter.03/23/2017-Lisa Ethlyn Gallery called to check if the orders for the Knee Scooter were put in. I left message for Misty Stanley informing her the orders had been put in on 03/20/2017 and [facility] was probably trying to contact pt, and I would email them to check the status.03/24/2017-Lisa Ethlyn Gallery states  she will contact [facility] also. Danny Angle,PT [employer] states he would like a verbal order to change the frequency of the PT to 1x this week, 2 x week for 2 weeks to continue to work on gait and balance and fall prevention with the knee scooter. Left message with verbal order confirmation for PT changes.03/29/2017-Amy Moon, PT - [facility] states pt was seen by Dr. Logan Bores, due to a fall today and pt was given new orders and she would like the update. Jonnie Finner - [facility] states pt said she can not get her antibiotic filled, the pharmacy stated needs to talk to our office. I spoke to [employer] [phone], she states pt's insurance will not pay until 04/04/2017, pt received Augmentin #28 on 03/24/2017. Left message explaining the information concerning the Augmentin, that I had received from [facility].04/03/2017-Faxed Dr. Logan Bores orders for pt to be strictly non-weight bearing of 03/2017 8:10pm to [place].04/04/2017-Amy, PT - [facility] request PT continue for 2 weeks at 2 times a week to begin next week. I told Amy, PT that Dr. Logan Bores okay. Amy, PT states pt fell again Thursday after her visit with Dr. Logan Bores Wednesday. Amy, PT states pt is often slurring speech, can barely get to the bathroom,and had hidden her antibiotic, thinking it was her pain medication. I asked if she felt pt would benefit from a [facility] aide and Amy, PT states OT goes out and assist but is not there all day. I told Amy, PT I would inform Dr. Logan Bores and possible change her medications, when she next request and that should be today or tomorrow.04/17/2017-Amy, PT - [facility] states she missed a day of PT last week and would like to make it up on the 04/24/2017. Amy, PT states pt is also moving to a new address.04/21/2017-Amy Moon, PT [place] states pt says she is to have a nurse come to the home.04/24/2017-Pt states she fell this morning but was calling for pain medication, needs to pick it up at the  [place] office. I told her she would need to pick it up between 4:30pm and 5:00pm.04/28/2017-Amy - [facility] states pt fell on Thursday going to the toilet. Amy requested new orders for PT. 05/01/2017-I spoke with Amy, PT - [facility] and she states she pt will be moving to another home and hopefully stop the falls. I okayed continued PT for pt.05/11/2017-Amy [place], PT - [facility] asked if Dr. Logan Bores had concerns re pt's PT.05/11/2017-Amy Waynetta Sandy, PT - [facility] asked if Dr. Logan Bores had any concerns re pt's PT. I informed Amy, PT that Dr. Logan Bores had said he did not.05/25/2017-Pt request a call back. I spoke with pt and she states she had to see Dr. Logan Bores yesterday  and she has a dime-size opening in her surgery site and is in a cast. She states Dr. Logan Bores said he does not want her doing any PT at this time and she should not have been having her leg in the air writing the alphabet. I told pt I would get orders from Dr. Logan Bores and speak with Chip Boer PT.05/26/2017-Dr. Logan Bores ordered discontinue all PT at this time. I informed Gaye Alken, PT of Dr. Logan Bores discontinuing PT orders.06/06/2017-Pt requests new rx for compression sock.

## 2017-02-28 ENCOUNTER — Encounter (INDEPENDENT_AMBULATORY_CARE_PROVIDER_SITE_OTHER): Payer: Medicare HMO

## 2017-02-28 NOTE — Telephone Encounter (Signed)
PA approved till 02/28/2018.

## 2017-03-02 ENCOUNTER — Ambulatory Visit: Payer: Medicare HMO | Admitting: Podiatry

## 2017-03-02 DIAGNOSIS — F331 Major depressive disorder, recurrent, moderate: Secondary | ICD-10-CM | POA: Diagnosis not present

## 2017-03-02 DIAGNOSIS — G894 Chronic pain syndrome: Secondary | ICD-10-CM | POA: Diagnosis not present

## 2017-03-02 DIAGNOSIS — S86021S Laceration of right Achilles tendon, sequela: Secondary | ICD-10-CM | POA: Diagnosis not present

## 2017-03-02 DIAGNOSIS — I10 Essential (primary) hypertension: Secondary | ICD-10-CM | POA: Diagnosis not present

## 2017-03-03 ENCOUNTER — Ambulatory Visit (INDEPENDENT_AMBULATORY_CARE_PROVIDER_SITE_OTHER): Payer: Medicare HMO | Admitting: Podiatry

## 2017-03-03 DIAGNOSIS — Z9889 Other specified postprocedural states: Secondary | ICD-10-CM

## 2017-03-03 MED ORDER — AMOXICILLIN-POT CLAVULANATE 875-125 MG PO TABS: 1.0000 | ORAL_TABLET | Freq: Two times a day (BID) | ORAL | 0 refills | Status: DC

## 2017-03-03 MED ORDER — HYDROMORPHONE HCL 4 MG PO TABS: 4.0000 mg | ORAL_TABLET | ORAL | 0 refills | Status: DC | PRN

## 2017-03-05 NOTE — Progress Notes (Signed)
Subjective: Patient presents today status post ankle arthroscopy with Achilles tendon repair and retrocalcaneal exostectomy to the right lower extremity. Date of surgery 02/09/17. Pt states she fell recently and her sutures have come undone. She reports associated drainage and bleeding from the wound.   Objective: Significant amount of ecchymosis and swelling noted throughout the ankle and posterior heel region right lower extremity. Surgical dehiscence noted to the incision site of the posterior aspect of the right foot. There is periwound maceration however that is not appear to be a significant amount of cellulitis. No sign of infection. Loss of forced plantarflexion of the right ankle suggestive that the surgical repair site was compromised  Assessment: Status post retrocalcaneal exostectomy with Achilles tendon repair right. Ankle arthroscopy right. Date of surgery 02/10/2017. Negative for DVT. Surgical dehiscence secondary to slip and fall.  Plan of care: Today the patient was evaluated. Cast applied. Authorization for surgery for Thursday 03/09/17. Return to clinic 1 week postop.    Felecia Shelling, DPM [facility]  Dr. Felecia Shelling, DPM    [address]                                        [place], [place] [address]                Office [phone]  Fax [phone]

## 2017-03-05 NOTE — Patient Instructions (Signed)
UNGALNAILSPTINSTRUCTIONS

## 2017-03-06 ENCOUNTER — Ambulatory Visit: Payer: Self-pay | Admitting: Family Medicine

## 2017-03-06 ENCOUNTER — Telehealth: Payer: Self-pay | Admitting: *Deleted

## 2017-03-06 ENCOUNTER — Telehealth: Payer: Self-pay | Admitting: Podiatry

## 2017-03-06 ENCOUNTER — Telehealth: Payer: Self-pay | Admitting: Family Medicine

## 2017-03-06 NOTE — Telephone Encounter (Signed)
Can we start the dismissal process for this patient? Today was her 4th no show. Thanks.

## 2017-03-06 NOTE — Telephone Encounter (Signed)
Patient called to find out what time she needs to arrive for her surgery on Thursday with Dr. Logan Bores? Advised her that I cannot see that schedule and she would need to talk to [facility] in [place]. Patient would like you to call her with details about her surgery.

## 2017-03-06 NOTE — Telephone Encounter (Signed)
"  I need to know about my surgery.  I need to have this done at soon as possible.  What's the status.  He's going to have to go back in and do some work on my Achilles.  My foot got infected.  He said he was going to schedule it.  I haven't heard anything."  I am not aware of a surgery being scheduled for you.   I will ask Dr. Logan Bores and give you a call back."

## 2017-03-06 NOTE — Telephone Encounter (Signed)
Pt called in about no one has called her back about if she needed to have surgery

## 2017-03-07 ENCOUNTER — Telehealth: Payer: Self-pay | Admitting: Family Medicine

## 2017-03-07 NOTE — Telephone Encounter (Signed)
Process started, thanks

## 2017-03-07 NOTE — Telephone Encounter (Signed)
Surgical forms have been faxed to [facility] with confirmation

## 2017-03-07 NOTE — Progress Notes (Signed)
Patient dismissal process started in HIM 

## 2017-03-07 NOTE — Telephone Encounter (Addendum)
I'm calling to let you know that we have you scheduled onThursday for surgery.  "Thank you so much.  Do I go to the same place as before?"  You're going to [facility].  [address], [place] [address].

## 2017-03-07 NOTE — Telephone Encounter (Signed)
"  I'm calling to see if you got a response from Dr. Logan Bores about my surgery."  I had to leave early yesterday so, I have not received a response but Joya San said Rebecca Ramos in our [place] office is supposed to be sending me the paperwork.  I have not received it yet but as soon as I do, I will let you know.

## 2017-03-07 NOTE — Telephone Encounter (Signed)
Patient dismissed from [facility] by Marikay Alar MD , effective March 07, 2017. Dismissal letter sent out by certified / registered mail.  daj

## 2017-03-08 DIAGNOSIS — I10 Essential (primary) hypertension: Secondary | ICD-10-CM | POA: Diagnosis not present

## 2017-03-08 DIAGNOSIS — G894 Chronic pain syndrome: Secondary | ICD-10-CM | POA: Diagnosis not present

## 2017-03-08 DIAGNOSIS — F331 Major depressive disorder, recurrent, moderate: Secondary | ICD-10-CM | POA: Diagnosis not present

## 2017-03-08 DIAGNOSIS — S86021S Laceration of right Achilles tendon, sequela: Secondary | ICD-10-CM | POA: Diagnosis not present

## 2017-03-09 ENCOUNTER — Ambulatory Visit: Payer: Self-pay | Admitting: Family Medicine

## 2017-03-09 ENCOUNTER — Encounter: Payer: Self-pay | Admitting: Podiatry

## 2017-03-09 DIAGNOSIS — S86011A Strain of right Achilles tendon, initial encounter: Secondary | ICD-10-CM | POA: Diagnosis not present

## 2017-03-09 DIAGNOSIS — M66361 Spontaneous rupture of flexor tendons, right lower leg: Secondary | ICD-10-CM | POA: Diagnosis not present

## 2017-03-09 DIAGNOSIS — M25571 Pain in right ankle and joints of right foot: Secondary | ICD-10-CM | POA: Diagnosis not present

## 2017-03-09 DIAGNOSIS — I10 Essential (primary) hypertension: Secondary | ICD-10-CM | POA: Diagnosis not present

## 2017-03-09 DIAGNOSIS — S96911D Strain of unspecified muscle and tendon at ankle and foot level, right foot, subsequent encounter: Secondary | ICD-10-CM | POA: Diagnosis not present

## 2017-03-10 MED ORDER — TRAMADOL HCL 50 MG PO TABS: 50.0000 mg | ORAL_TABLET | Freq: Four times a day (QID) | ORAL | 0 refills | Status: DC | PRN

## 2017-03-14 DIAGNOSIS — S86091D Other specified injury of right Achilles tendon, subsequent encounter: Secondary | ICD-10-CM | POA: Diagnosis not present

## 2017-03-14 DIAGNOSIS — J449 Chronic obstructive pulmonary disease, unspecified: Secondary | ICD-10-CM | POA: Diagnosis not present

## 2017-03-14 DIAGNOSIS — F1420 Cocaine dependence, uncomplicated: Secondary | ICD-10-CM | POA: Diagnosis not present

## 2017-03-14 DIAGNOSIS — G894 Chronic pain syndrome: Secondary | ICD-10-CM | POA: Diagnosis not present

## 2017-03-14 DIAGNOSIS — F332 Major depressive disorder, recurrent severe without psychotic features: Secondary | ICD-10-CM | POA: Diagnosis not present

## 2017-03-14 DIAGNOSIS — Z4789 Encounter for other orthopedic aftercare: Secondary | ICD-10-CM | POA: Diagnosis not present

## 2017-03-14 DIAGNOSIS — G8929 Other chronic pain: Secondary | ICD-10-CM | POA: Diagnosis not present

## 2017-03-14 DIAGNOSIS — I10 Essential (primary) hypertension: Secondary | ICD-10-CM | POA: Diagnosis not present

## 2017-03-14 DIAGNOSIS — M549 Dorsalgia, unspecified: Secondary | ICD-10-CM | POA: Diagnosis not present

## 2017-03-14 NOTE — Telephone Encounter (Signed)
Received signed domestic return receipt verifying delivery of certified letter on March 10, 2017. Article number [id number] daj

## 2017-03-16 DIAGNOSIS — F1420 Cocaine dependence, uncomplicated: Secondary | ICD-10-CM | POA: Diagnosis not present

## 2017-03-16 DIAGNOSIS — G8929 Other chronic pain: Secondary | ICD-10-CM | POA: Diagnosis not present

## 2017-03-16 DIAGNOSIS — S86091D Other specified injury of right Achilles tendon, subsequent encounter: Secondary | ICD-10-CM | POA: Diagnosis not present

## 2017-03-16 DIAGNOSIS — M549 Dorsalgia, unspecified: Secondary | ICD-10-CM | POA: Diagnosis not present

## 2017-03-16 DIAGNOSIS — J449 Chronic obstructive pulmonary disease, unspecified: Secondary | ICD-10-CM | POA: Diagnosis not present

## 2017-03-16 DIAGNOSIS — G894 Chronic pain syndrome: Secondary | ICD-10-CM | POA: Diagnosis not present

## 2017-03-16 DIAGNOSIS — F332 Major depressive disorder, recurrent severe without psychotic features: Secondary | ICD-10-CM | POA: Diagnosis not present

## 2017-03-16 DIAGNOSIS — I10 Essential (primary) hypertension: Secondary | ICD-10-CM | POA: Diagnosis not present

## 2017-03-16 DIAGNOSIS — Z4789 Encounter for other orthopedic aftercare: Secondary | ICD-10-CM | POA: Diagnosis not present

## 2017-03-16 NOTE — Telephone Encounter (Signed)
Non weight bearing 4-6 weeks. Thanks, Dr. Logan BoresEvans

## 2017-03-17 ENCOUNTER — Ambulatory Visit (INDEPENDENT_AMBULATORY_CARE_PROVIDER_SITE_OTHER): Payer: Medicare HMO | Admitting: Podiatry

## 2017-03-17 VITALS — Temp 98.3°F

## 2017-03-17 DIAGNOSIS — Z9889 Other specified postprocedural states: Secondary | ICD-10-CM

## 2017-03-17 DIAGNOSIS — M6528 Calcific tendinitis, other site: Secondary | ICD-10-CM

## 2017-03-17 MED ORDER — TRAMADOL HCL 50 MG PO TABS: 50.0000 mg | ORAL_TABLET | Freq: Four times a day (QID) | ORAL | 0 refills | Status: DC | PRN

## 2017-03-17 MED ORDER — AMOXICILLIN-POT CLAVULANATE 875-125 MG PO TABS: 1.0000 | ORAL_TABLET | Freq: Two times a day (BID) | ORAL | 0 refills | Status: DC

## 2017-03-17 MED ORDER — ONDANSETRON HCL 8 MG PO TABS: 8.0000 mg | ORAL_TABLET | Freq: Every day | ORAL | 0 refills | Status: DC

## 2017-03-17 MED ORDER — OXYCODONE-ACETAMINOPHEN 10-325 MG PO TABS: 1.0000 | ORAL_TABLET | Freq: Four times a day (QID) | ORAL | 0 refills | Status: DC | PRN

## 2017-03-19 NOTE — Progress Notes (Signed)
Subjective: Patient presents today status post revision repair of right Achilles. DOS 03/09/17. She states she is having more pain and then with her first surgery. She reports a fever of 100 per home health nurse. Patient is afebrile at today's visit.  Objective: There is a moderate amount of drainage to the surgical incision site with peri-incisional maceration. There appears to be adequate strength regarding the integrity of the Achilles tendon. Mild dehiscence noted to the central portion of the incision site.   Assessment: Status post revisional repair of right Achilles. DOS 03/09/17.  Plan of care: Today the patient was evaluated. Cast applied. Continue nonweightbearing. Prescription for Augmentin, Percocet 10/325 mg, tramadol and Zofran. Return to clinic in 1 week.  Felecia ShellingBrent M. Jariya Reichow, DPM [facility]  Dr. Felecia ShellingBrent M. Xayden Linsey, DPM    [address]                                        [place], [place] [id number]                Office [phone]  Fax [phone]

## 2017-03-21 DIAGNOSIS — Z4789 Encounter for other orthopedic aftercare: Secondary | ICD-10-CM | POA: Diagnosis not present

## 2017-03-21 DIAGNOSIS — I10 Essential (primary) hypertension: Secondary | ICD-10-CM | POA: Diagnosis not present

## 2017-03-21 DIAGNOSIS — M549 Dorsalgia, unspecified: Secondary | ICD-10-CM | POA: Diagnosis not present

## 2017-03-21 DIAGNOSIS — S86091D Other specified injury of right Achilles tendon, subsequent encounter: Secondary | ICD-10-CM | POA: Diagnosis not present

## 2017-03-21 DIAGNOSIS — G8929 Other chronic pain: Secondary | ICD-10-CM | POA: Diagnosis not present

## 2017-03-21 DIAGNOSIS — G894 Chronic pain syndrome: Secondary | ICD-10-CM | POA: Diagnosis not present

## 2017-03-21 DIAGNOSIS — J449 Chronic obstructive pulmonary disease, unspecified: Secondary | ICD-10-CM | POA: Diagnosis not present

## 2017-03-21 DIAGNOSIS — F332 Major depressive disorder, recurrent severe without psychotic features: Secondary | ICD-10-CM | POA: Diagnosis not present

## 2017-03-21 DIAGNOSIS — F1420 Cocaine dependence, uncomplicated: Secondary | ICD-10-CM | POA: Diagnosis not present

## 2017-03-24 ENCOUNTER — Ambulatory Visit: Payer: Medicare HMO

## 2017-03-24 ENCOUNTER — Ambulatory Visit (INDEPENDENT_AMBULATORY_CARE_PROVIDER_SITE_OTHER): Payer: Medicare HMO | Admitting: Podiatry

## 2017-03-24 DIAGNOSIS — S86091D Other specified injury of right Achilles tendon, subsequent encounter: Secondary | ICD-10-CM | POA: Diagnosis not present

## 2017-03-24 DIAGNOSIS — G894 Chronic pain syndrome: Secondary | ICD-10-CM | POA: Diagnosis not present

## 2017-03-24 DIAGNOSIS — Z9889 Other specified postprocedural states: Secondary | ICD-10-CM

## 2017-03-24 DIAGNOSIS — J449 Chronic obstructive pulmonary disease, unspecified: Secondary | ICD-10-CM | POA: Diagnosis not present

## 2017-03-24 DIAGNOSIS — M549 Dorsalgia, unspecified: Secondary | ICD-10-CM | POA: Diagnosis not present

## 2017-03-24 DIAGNOSIS — Z4789 Encounter for other orthopedic aftercare: Secondary | ICD-10-CM | POA: Diagnosis not present

## 2017-03-24 DIAGNOSIS — G8929 Other chronic pain: Secondary | ICD-10-CM | POA: Diagnosis not present

## 2017-03-24 DIAGNOSIS — F332 Major depressive disorder, recurrent severe without psychotic features: Secondary | ICD-10-CM | POA: Diagnosis not present

## 2017-03-24 DIAGNOSIS — I10 Essential (primary) hypertension: Secondary | ICD-10-CM | POA: Diagnosis not present

## 2017-03-24 DIAGNOSIS — F1420 Cocaine dependence, uncomplicated: Secondary | ICD-10-CM | POA: Diagnosis not present

## 2017-03-24 MED ORDER — OXYCODONE-ACETAMINOPHEN 10-325 MG PO TABS: 1.0000 | ORAL_TABLET | Freq: Four times a day (QID) | ORAL | 0 refills | Status: DC | PRN

## 2017-03-24 MED ORDER — AMOXICILLIN-POT CLAVULANATE 875-125 MG PO TABS: 1.0000 | ORAL_TABLET | Freq: Two times a day (BID) | ORAL | 0 refills | Status: DC

## 2017-03-27 NOTE — Progress Notes (Signed)
Subjective: Patient presents today status post revision repair of right Achilles. DOS 03/09/17. She states she is still having pain.   Objective: Significant amount of ecchymosis and swelling noted throughout the ankle and posterior heel region right lower extremity. Surgical dehiscence noted to the incision site of the posterior aspect of the right foot. There is periwound maceration however that is not appear to be a significant amount of cellulitis. No sign of infection. Loss of forced plantarflexion of the right ankle suggestive that the surgical repair site was compromised   Assessment: Status post revisional repair of right Achilles. DOS 03/09/17. Dehiscence of transverse incision site.  Plan of care: Today the patient was evaluated. Cast reapplied. Continue nonweightbearing. Refill prescription for Percocet 5/325 mg. Refill admitted 875/125 mg #28. Return to clinic in 1 week.  Felecia ShellingBrent M. Evans, DPM [facility]  Dr. Felecia ShellingBrent M. Evans, DPM    [address]                                        [place], [place] [address]                Office [phone](336) [phone]  Fax [phone](336) [phone]

## 2017-03-29 ENCOUNTER — Ambulatory Visit (INDEPENDENT_AMBULATORY_CARE_PROVIDER_SITE_OTHER): Payer: Medicare HMO | Admitting: Podiatry

## 2017-03-29 ENCOUNTER — Ambulatory Visit (INDEPENDENT_AMBULATORY_CARE_PROVIDER_SITE_OTHER): Payer: Medicare HMO

## 2017-03-29 DIAGNOSIS — Z9889 Other specified postprocedural states: Secondary | ICD-10-CM

## 2017-03-29 DIAGNOSIS — S86091D Other specified injury of right Achilles tendon, subsequent encounter: Secondary | ICD-10-CM | POA: Diagnosis not present

## 2017-03-29 DIAGNOSIS — Z4789 Encounter for other orthopedic aftercare: Secondary | ICD-10-CM | POA: Diagnosis not present

## 2017-03-29 DIAGNOSIS — J449 Chronic obstructive pulmonary disease, unspecified: Secondary | ICD-10-CM | POA: Diagnosis not present

## 2017-03-29 DIAGNOSIS — G8929 Other chronic pain: Secondary | ICD-10-CM | POA: Diagnosis not present

## 2017-03-29 DIAGNOSIS — M549 Dorsalgia, unspecified: Secondary | ICD-10-CM | POA: Diagnosis not present

## 2017-03-29 DIAGNOSIS — F332 Major depressive disorder, recurrent severe without psychotic features: Secondary | ICD-10-CM | POA: Diagnosis not present

## 2017-03-29 DIAGNOSIS — G894 Chronic pain syndrome: Secondary | ICD-10-CM | POA: Diagnosis not present

## 2017-03-29 DIAGNOSIS — F1420 Cocaine dependence, uncomplicated: Secondary | ICD-10-CM | POA: Diagnosis not present

## 2017-03-29 DIAGNOSIS — I10 Essential (primary) hypertension: Secondary | ICD-10-CM | POA: Diagnosis not present

## 2017-03-29 MED ORDER — HYDROMORPHONE HCL 4 MG PO TABS: 4.0000 mg | ORAL_TABLET | ORAL | 0 refills | Status: DC | PRN

## 2017-03-29 MED ORDER — AMOXICILLIN-POT CLAVULANATE 875-125 MG PO TABS: 1.0000 | ORAL_TABLET | Freq: Two times a day (BID) | ORAL | 0 refills | Status: DC

## 2017-03-30 ENCOUNTER — Telehealth: Payer: Self-pay | Admitting: Podiatry

## 2017-03-30 DIAGNOSIS — Z9889 Other specified postprocedural states: Secondary | ICD-10-CM

## 2017-03-30 NOTE — Telephone Encounter (Signed)
Patient called said she saw dr Clayburn Pertevan yesterday in [place]. He wrote an RX for a knee scooter. She took it to [facility] [place] and they [employer] file insurance, but they kept [employer].  Patient is dealing with [facility] in [place] and they have requested that Dr. Logan BoresEvans fax them a new RX for the knee scooter as well as notes telling what type of surgery she had and why she needed this scooter along with the new RX for it. Fax: [phone]. Thanks !

## 2017-03-30 NOTE — Addendum Note (Signed)
Addended by: Alphia Kava'CONNELL, Brahim Dolman D on: 03/30/2017 03:25 PM   Modules accepted: Orders

## 2017-03-30 NOTE — Telephone Encounter (Signed)
Faxed to [facility] 

## 2017-03-30 NOTE — Progress Notes (Signed)
Subjective: Patient presents today status post revision repair of right Achilles. DOS 03/09/17. She states she is continuing to have pain in the heel. She also reports falling. She also states she does not have any medication at this time because they all fell in the toilet.  Objective: Improved amount of ecchymosis and swelling noted throughout the ankle and posterior heel region right lower extremity. Surgical dehiscence noted to the incision site of the posterior aspect of the right foot. Dehiscence appears stable. There is some modest improvement. There is periwound maceration however that is not appear to be a significant amount of cellulitis. No sign of infection.   Radiographic Exam: Orthopedic hardware and osteotomies sites appear to be stable with routine healing.  Assessment: Status post revisional repair of right Achilles. DOS 03/09/17. Dehiscence of transverse incision site.  Plan of care: Today the patient was evaluated. X-rays reviewed. Refill prescription for Augmentin # 28 provided to patient. Refill prescription for Dilaudid 4 mg provided to patient. Cast removed. Cast reapplied. Partial sutures/staples removed. Prisma applied. Return to clinic in 1 week.   Felecia ShellingBrent M. Mikiala Fugett, DPM [facility]  Dr. Felecia ShellingBrent M. Dravin Lance, DPM    [address]                                        [place], [place] [address]                Office [phone]  Fax [phone]

## 2017-03-31 ENCOUNTER — Encounter: Payer: Medicare HMO | Admitting: Podiatry

## 2017-03-31 ENCOUNTER — Telehealth: Payer: Self-pay | Admitting: Podiatry

## 2017-03-31 DIAGNOSIS — M549 Dorsalgia, unspecified: Secondary | ICD-10-CM | POA: Diagnosis not present

## 2017-03-31 DIAGNOSIS — Z4789 Encounter for other orthopedic aftercare: Secondary | ICD-10-CM | POA: Diagnosis not present

## 2017-03-31 DIAGNOSIS — F332 Major depressive disorder, recurrent severe without psychotic features: Secondary | ICD-10-CM | POA: Diagnosis not present

## 2017-03-31 DIAGNOSIS — I10 Essential (primary) hypertension: Secondary | ICD-10-CM | POA: Diagnosis not present

## 2017-03-31 DIAGNOSIS — Z9889 Other specified postprocedural states: Secondary | ICD-10-CM

## 2017-03-31 DIAGNOSIS — F1420 Cocaine dependence, uncomplicated: Secondary | ICD-10-CM | POA: Diagnosis not present

## 2017-03-31 DIAGNOSIS — J449 Chronic obstructive pulmonary disease, unspecified: Secondary | ICD-10-CM | POA: Diagnosis not present

## 2017-03-31 DIAGNOSIS — S86091D Other specified injury of right Achilles tendon, subsequent encounter: Secondary | ICD-10-CM | POA: Diagnosis not present

## 2017-03-31 DIAGNOSIS — G894 Chronic pain syndrome: Secondary | ICD-10-CM | POA: Diagnosis not present

## 2017-03-31 DIAGNOSIS — G8929 Other chronic pain: Secondary | ICD-10-CM | POA: Diagnosis not present

## 2017-03-31 NOTE — Telephone Encounter (Signed)
Patient called in regards to some medical equipment she is supposed to receive. Stated she called the medical equipment place here in [place] and they told her they sent the paperwork back because it was blank where it was supposed to have written what she needed. She also stated that she has another question. Requested a call back at [phone].

## 2017-03-31 NOTE — Addendum Note (Signed)
Addended by: Alphia Kava'CONNELL, VALERY D on: 03/31/2017 02:00 PM   Modules accepted: Orders

## 2017-03-31 NOTE — Telephone Encounter (Signed)
Strict nonweightbearing surgical extremity

## 2017-03-31 NOTE — Telephone Encounter (Signed)
I spoke with pt and she states the knee scooter was not in the comment section of the order. I told pt I would refax with knee scooter.

## 2017-04-04 DIAGNOSIS — F1420 Cocaine dependence, uncomplicated: Secondary | ICD-10-CM | POA: Diagnosis not present

## 2017-04-04 DIAGNOSIS — J449 Chronic obstructive pulmonary disease, unspecified: Secondary | ICD-10-CM | POA: Diagnosis not present

## 2017-04-04 DIAGNOSIS — S86091D Other specified injury of right Achilles tendon, subsequent encounter: Secondary | ICD-10-CM | POA: Diagnosis not present

## 2017-04-04 DIAGNOSIS — G894 Chronic pain syndrome: Secondary | ICD-10-CM | POA: Diagnosis not present

## 2017-04-04 DIAGNOSIS — F332 Major depressive disorder, recurrent severe without psychotic features: Secondary | ICD-10-CM | POA: Diagnosis not present

## 2017-04-04 DIAGNOSIS — I10 Essential (primary) hypertension: Secondary | ICD-10-CM | POA: Diagnosis not present

## 2017-04-04 DIAGNOSIS — G8929 Other chronic pain: Secondary | ICD-10-CM | POA: Diagnosis not present

## 2017-04-04 DIAGNOSIS — M549 Dorsalgia, unspecified: Secondary | ICD-10-CM | POA: Diagnosis not present

## 2017-04-04 DIAGNOSIS — Z4789 Encounter for other orthopedic aftercare: Secondary | ICD-10-CM | POA: Diagnosis not present

## 2017-04-04 NOTE — Progress Notes (Signed)
1. Achilles tendon repair right 2. Revisional repair of surgical right

## 2017-04-05 DIAGNOSIS — G894 Chronic pain syndrome: Secondary | ICD-10-CM | POA: Diagnosis not present

## 2017-04-05 DIAGNOSIS — I10 Essential (primary) hypertension: Secondary | ICD-10-CM | POA: Diagnosis not present

## 2017-04-05 DIAGNOSIS — F1420 Cocaine dependence, uncomplicated: Secondary | ICD-10-CM | POA: Diagnosis not present

## 2017-04-05 DIAGNOSIS — J449 Chronic obstructive pulmonary disease, unspecified: Secondary | ICD-10-CM | POA: Diagnosis not present

## 2017-04-05 DIAGNOSIS — G8929 Other chronic pain: Secondary | ICD-10-CM | POA: Diagnosis not present

## 2017-04-05 DIAGNOSIS — S86091D Other specified injury of right Achilles tendon, subsequent encounter: Secondary | ICD-10-CM | POA: Diagnosis not present

## 2017-04-05 DIAGNOSIS — F332 Major depressive disorder, recurrent severe without psychotic features: Secondary | ICD-10-CM | POA: Diagnosis not present

## 2017-04-05 DIAGNOSIS — M549 Dorsalgia, unspecified: Secondary | ICD-10-CM | POA: Diagnosis not present

## 2017-04-05 DIAGNOSIS — Z4789 Encounter for other orthopedic aftercare: Secondary | ICD-10-CM | POA: Diagnosis not present

## 2017-04-06 ENCOUNTER — Telehealth: Payer: Self-pay | Admitting: Student

## 2017-04-06 DIAGNOSIS — M549 Dorsalgia, unspecified: Secondary | ICD-10-CM | POA: Diagnosis not present

## 2017-04-06 DIAGNOSIS — J449 Chronic obstructive pulmonary disease, unspecified: Secondary | ICD-10-CM | POA: Diagnosis not present

## 2017-04-06 DIAGNOSIS — F1420 Cocaine dependence, uncomplicated: Secondary | ICD-10-CM | POA: Diagnosis not present

## 2017-04-06 DIAGNOSIS — F332 Major depressive disorder, recurrent severe without psychotic features: Secondary | ICD-10-CM | POA: Diagnosis not present

## 2017-04-06 DIAGNOSIS — G8929 Other chronic pain: Secondary | ICD-10-CM | POA: Diagnosis not present

## 2017-04-06 DIAGNOSIS — Z4789 Encounter for other orthopedic aftercare: Secondary | ICD-10-CM | POA: Diagnosis not present

## 2017-04-06 DIAGNOSIS — I10 Essential (primary) hypertension: Secondary | ICD-10-CM | POA: Diagnosis not present

## 2017-04-06 DIAGNOSIS — G894 Chronic pain syndrome: Secondary | ICD-10-CM | POA: Diagnosis not present

## 2017-04-06 DIAGNOSIS — S86091D Other specified injury of right Achilles tendon, subsequent encounter: Secondary | ICD-10-CM | POA: Diagnosis not present

## 2017-04-06 NOTE — Telephone Encounter (Addendum)
  Received a call from Home Health regarding the patient having elevated BP of 143/127. Checked numerous times with similar readings.   In reviewing her BP medications, she was switched from Lopressor to Cardizem CD 180mg  daily in 11/2016. She misunderstood the instructions and also stopped her Lisinopril at that time as well. I recommended resuming her Lisinopril 10mg  daily. Home Health RN and PT will continue to check her BP regularly. Can further titrate Lisinopril if BP remains elevated.    Signed, Ellsworth LennoxBrittany M Kayly Kriegel, PA-C 04/06/2017, 6:29 PM

## 2017-04-07 ENCOUNTER — Ambulatory Visit (INDEPENDENT_AMBULATORY_CARE_PROVIDER_SITE_OTHER): Payer: Medicare HMO | Admitting: Podiatry

## 2017-04-07 ENCOUNTER — Encounter: Payer: Self-pay | Admitting: Podiatry

## 2017-04-07 ENCOUNTER — Ambulatory Visit (INDEPENDENT_AMBULATORY_CARE_PROVIDER_SITE_OTHER): Payer: Medicare HMO

## 2017-04-07 DIAGNOSIS — M6528 Calcific tendinitis, other site: Secondary | ICD-10-CM | POA: Diagnosis not present

## 2017-04-07 DIAGNOSIS — T8131XD Disruption of external operation (surgical) wound, not elsewhere classified, subsequent encounter: Secondary | ICD-10-CM | POA: Diagnosis not present

## 2017-04-07 DIAGNOSIS — L97512 Non-pressure chronic ulcer of other part of right foot with fat layer exposed: Secondary | ICD-10-CM

## 2017-04-07 DIAGNOSIS — Z9889 Other specified postprocedural states: Secondary | ICD-10-CM

## 2017-04-07 DIAGNOSIS — M7731 Calcaneal spur, right foot: Secondary | ICD-10-CM | POA: Diagnosis not present

## 2017-04-07 MED ORDER — HYDROMORPHONE HCL 4 MG PO TABS: 4.0000 mg | ORAL_TABLET | ORAL | 0 refills | Status: DC | PRN

## 2017-04-08 NOTE — Progress Notes (Signed)
Subjective: Patient presents today status post revision repair of right Achilles. DOS 03/09/17. She reports a yellow/brown drainage from the incision site. She reports associated low grade fever and diarrhea that has been present for the past week that she believes is due to the antibiotic.   Objective: Improved amount of ecchymosis and swelling noted throughout the ankle and posterior heel region right lower extremity. Surgical dehiscence noted to the incision site of the posterior aspect of the right foot. Dehiscence appears stable. There is some modest improvement. There is periwound maceration however that is not appear to be a significant amount of cellulitis. No sign of infection.   Wound #1 noted to the  right posterior heel1.0 x 4.0 x 0.2 cm (LxWxD).   To the noted ulceration(s), there is no eschar. There is a moderate amount of slough, fibrin, and necrotic tissue noted. Granulation tissue and wound base is red. There is a minimal amount of serosanguineous drainage noted. There is no exposed bone muscle-tendon ligament or joint. There is no malodor. Periwound integrity is intact. Skin is warm, dry and supple bilateral lower extremities.  Radiographic Exam: Orthopedic hardware and osteotomies sites appear to be stable with routine healing.  Assessment: Status post revisional repair of right Achilles. DOS 03/09/17. Dehiscence of transverse incision site.  Plan of care: Today the patient was evaluated. X-rays reviewed. Staples/sutures removed. Cast reapplied. Medically necessary excisional debridement including muscle and deep fascial tissue was performed using a tissue nipper and a chisel blade. Excisional debridement of all the necrotic nonviable tissue down to healthy bleeding viable tissue was performed with post-debridement measurements same as pre-. The wound was cleansed and dry sterile dressing applied. Finish taking all Augmentin. Refill prescription for Dilaudid 4 mg given to patient.  Return to clinic in 2 weeks.   Felecia ShellingBrent M. Cuahutemoc Attar, DPM [facility]  Dr. Felecia ShellingBrent M. Caedmon Louque, DPM    [address]                                        [place], [place] [address]                Office [phone]  Fax [phone]) [phone]

## 2017-04-10 ENCOUNTER — Emergency Department: Payer: Medicare HMO

## 2017-04-10 ENCOUNTER — Emergency Department
Admission: EM | Admit: 2017-04-10 | Discharge: 2017-04-10 | Disposition: A | Discharge status: Home / Self Care | Payer: Medicare HMO | Attending: Emergency Medicine | Admitting: Emergency Medicine

## 2017-04-10 DIAGNOSIS — Z79899 Other long term (current) drug therapy: Secondary | ICD-10-CM | POA: Insufficient documentation

## 2017-04-10 DIAGNOSIS — M7989 Other specified soft tissue disorders: Secondary | ICD-10-CM | POA: Diagnosis not present

## 2017-04-10 DIAGNOSIS — I10 Essential (primary) hypertension: Secondary | ICD-10-CM | POA: Insufficient documentation

## 2017-04-10 DIAGNOSIS — J449 Chronic obstructive pulmonary disease, unspecified: Secondary | ICD-10-CM | POA: Diagnosis not present

## 2017-04-10 DIAGNOSIS — M79605 Pain in left leg: Secondary | ICD-10-CM | POA: Diagnosis not present

## 2017-04-10 NOTE — ED Notes (Signed)
See triage note  States she developed pain and swelling to left leg  S/p surgery to right foot about 1 month ago  But sx's started about 1 week ago

## 2017-04-10 NOTE — ED Triage Notes (Signed)
Pt reports right leg surgery x2 in the past 2 months. Pt reports initial surgery she was given something for a blood clot, but after second surgery she was not prescribed something. Pt reports increased pain and swelling to left leg.

## 2017-04-10 NOTE — ED Notes (Signed)
Patient transported to Ultrasound 

## 2017-04-10 NOTE — ED Provider Notes (Signed)
[facility] Emergency Department Provider Note ____________________________________________  Time seen: 11:32 AM  I have reviewed the triage vital signs and the nursing notes.  HISTORY  Chief Complaint  Leg Pain   HPI Rebecca Ramos is a 56 y.o. female is here complaining of left leg pain. Patient states that she had surgery on her right leg for a spur and a partial tear of her Achilles tendon thousand 2 months ago. Patient states that after the first surgery she fell at home and this required a second surgery. Today she is here with complaint of left leg pain. She is concerned because she does have a history of DVTs in the past. She states with her first surgery she had Lovenox but with the second surgery she was not given any medications. Currently her pain medication at home is Dilaudid 4 mg which she continues to take. Pain is 7/10.    Past Medical History:  Diagnosis Date  . Acid reflux   . Anxiety   . Chronic lower back pain    osteoarthritis in luber spine  . COPD (chronic obstructive pulmonary disease) (HCC)   . Depression    Bipolar  . Dysrhythmia    "skips" beats rx  . Heart murmur   . History of degenerative disc disease   . History of pulmonary embolus (PE)   . HTN (hypertension) 03/20/2015  . Hypertension   . Osteoarthritis of lumbosacral spine   . Peripheral vascular disease (HCC) 2013   had PE after hysterectomy  . Pneumonia    hx  . PSVT (paroxysmal supraventricular tachycardia) (HCC)   . Right Achilles tendinitis   . Thrombus    right lung in 2015, off anticoagulation now  . Transaminitis     Patient Active Problem List   Diagnosis Date Noted  . Pre-op evaluation 01/16/2017  . Paresthesia of left arm 12/08/2016  . Headache 11/10/2016  . Anxiety 11/10/2016  . Palpitations 11/09/2016  . Fatty liver 11/09/2016  . Abdominal pain, left upper quadrant 10/20/2016  . Nausea without vomiting 08/09/2016  . Pedal edema 08/09/2016  .  Frequent urination 06/23/2016  . Chronic pain syndrome 06/23/2016  . GERD (gastroesophageal reflux disease) 06/10/2016  . COPD (chronic obstructive pulmonary disease) (HCC) 06/08/2016  . Acute bronchitis 06/08/2016  . Hyperlipidemia 06/08/2016  . Obesity 06/08/2016  . Cocaine abuse 06/08/2016  . Tobacco abuse counseling 06/08/2016  . Atypical chest pain 06/06/2016  . Cocaine use disorder, moderate, dependence (HCC) 03/28/2016  . Severe recurrent major depression without psychotic features (HCC) 03/26/2016  . Suicide attempt (HCC) 03/26/2016  . Chronic back pain 03/26/2016  . HTN (hypertension) 03/20/2015    Past Surgical History:  Procedure Laterality Date  . ABDOMINAL HYSTERECTOMY    . ACHILLES TENDON SURGERY Right 02/09/2017   Procedure: ACHILLES TENDON REPAIR;  Surgeon: Felecia ShellingBrent M Evans, DPM;  [facility];  Service: Podiatry;  Laterality: Right;  . ANKLE ARTHROSCOPY Right 02/09/2017   Procedure: ANKLE ARTHROSCOPY RIGHT FOOT;  Surgeon: Felecia ShellingBrent M Evans, DPM;  [facility];  Service: Podiatry;  Laterality: Right;  . CALCANEAL OSTEOTOMY Right 02/09/2017   Procedure: RETRO CALCANEOUS EXOSTECTOMY;  Surgeon: Felecia ShellingBrent M Evans, DPM;  [facility];  Service: Podiatry;  Laterality: Right;  . CESAREAN SECTION Bilateral   . CESAREAN SECTION Bilateral   . ELBOW SURGERY     bilateral  . thumb surgery     right   . TONSILLECTOMY      Prior to Admission medications  Medication Sig Start Date End Date Taking? Authorizing Provider  ALPRAZolam Prudy Feeler) 1 MG tablet Take 1 mg by mouth 4 (four) times daily as needed for anxiety.  05/18/16   [provider]  amoxicillin-clavulanate (AUGMENTIN) 875-125 MG tablet Take 1 tablet by mouth 2 (two) times daily. 03/29/17   Felecia Shelling, DPM  Blood Pressure Monitoring (BLOOD PRESSURE CUFF) MISC 1 each by Does not apply route daily. 06/16/16   Loura Pardon, NP  budesonide-formoterol (SYMBICORT) 160-4.5 MCG/ACT inhaler Inhale 2 puffs into the lungs  2 (two) times daily. 09/27/16 09/27/17  Glori Luis, MD  COMBIVENT RESPIMAT 20-100 MCG/ACT AERS respimat Inhale 1 puff into the lungs 4 (four) times daily as needed. For wheezing. 06/10/16   Loura Pardon, NP  cyanocobalamin (,VITAMIN B-12,) 1000 MCG/ML injection Inject 1,000 mcg into the muscle every 30 (thirty) days.  01/13/16   [provider]  diltiazem (CARDIZEM CD) 180 MG 24 hr capsule Take 1 capsule (180 mg total) by mouth daily. 11/29/16 02/27/17  End, Cristal Deer, MD  enoxaparin (LOVENOX) 40 MG/0.4ML injection Inject 0.4 mLs (40 mg total) into the skin daily. Please take one syringe with you day of surgery so someone can show you how to use it. 02/01/17   Logan Bores, Larena Glassman, DPM  esomeprazole (NEXIUM) 20 MG capsule Take 1 capsule (20 mg total) by mouth 2 (two) times daily before a meal. Patient taking differently: Take 20 mg by mouth daily.  09/27/16   Glori Luis, MD  fluticasone (FLONASE) 50 MCG/ACT nasal spray Place 2 sprays into both nostrils daily as needed for rhinitis.     [provider]  furosemide (LASIX) 20 MG tablet TAKE 1 TABLET(20 MG) BY MOUTH DAILY Patient not taking: Reported on 01/30/2017 09/30/16   Glori Luis, MD  gentamicin cream (GARAMYCIN) 0.1 % Apply 1 application topically 3 (three) times daily. 02/24/17   Felecia Shelling, DPM  HYDROmorphone (DILAUDID) 4 MG tablet Take 1 tablet (4 mg total) by mouth every 4 (four) hours as needed for severe pain. 04/07/17   Felecia Shelling, DPM  hydrOXYzine (VISTARIL) 50 MG capsule Take 50 mg by mouth 3 (three) times daily as needed for itching. 01/29/17   [provider]  ibuprofen (ADVIL,MOTRIN) 600 MG tablet Take 1 tablet (600 mg total) by mouth every 8 (eight) hours as needed. Patient not taking: Reported on 01/30/2017 01/08/17   Merrily Brittle, MD  meloxicam (MOBIC) 15 MG tablet Take 15 mg by mouth daily.    Felecia Shelling, DPM  omega-3 acid ethyl esters (LOVAZA) 1 g capsule Take 2 g by mouth  daily.  06/17/16   [provider]  ondansetron (ZOFRAN) 4 MG tablet Take 1 tablet (4 mg total) by mouth every 8 (eight) hours as needed for nausea or vomiting. 02/08/17   Glori Luis, MD  ondansetron (ZOFRAN) 8 MG tablet Take 1 tablet (8 mg total) by mouth daily. 03/17/17   Felecia Shelling, DPM  oxyCODONE-acetaminophen (PERCOCET) 10-325 MG tablet Take 1 tablet by mouth every 6 (six) hours as needed for pain (for severe pain). 03/24/17   Felecia Shelling, DPM  polyethylene glycol powder (GLYCOLAX/MIRALAX) powder Take 17 g by mouth daily. Patient taking differently: Take 17 g by mouth daily as needed (for constipation.).  10/21/16   Glori Luis, MD  ranitidine (ZANTAC) 150 MG tablet Take 150 mg by mouth 2 (two) times daily as needed for heartburn.    [provider]  traMADol (ULTRAM) 50 MG tablet Take 1 tablet (50 mg total) by mouth every 6 (six) hours as needed. 03/10/17   Felecia Shelling, DPM  traMADol (ULTRAM) 50 MG tablet Take 1 tablet (50 mg total) by mouth every 6 (six) hours as needed. 03/17/17   Felecia Shelling, DPM  zolpidem (AMBIEN) 10 MG tablet Take 10 mg by mouth at bedtime as needed for sleep.  06/20/16   [provider]    Allergies Honey bee venom; Sulfacetamide sodium; Levofloxacin; Sulfa antibiotics; and Codeine  Family History  Problem Relation Age of Onset  . CAD Mother   . Heart disease Mother        Pacemaker  . CAD Sister   . Heart disease Sister        Heart surgery x 1  . Throat cancer Brother   . CAD Maternal Grandmother   . CAD Maternal Grandfather     Social History Social History  Substance Use Topics  . Smoking status: Never Smoker  . Smokeless tobacco: Never Used  . Alcohol use No    Review of Systems Constitutional: Negative for fever. Cardiovascular: Negative for chest pain. Respiratory: Negative for shortness of breath. Gastrointestinal: Negative for abdominal pain, Musculoskeletal: Positive for left leg pain Skin:  Negative for rash. Neurological: Negative for headaches, focal weakness or numbness. ____________________________________________  PHYSICAL EXAM:  VITAL SIGNS: ED Triage Vitals  Enc Vitals Group     BP 04/10/17 0931 (!) 154/71     Pulse Rate 04/10/17 0931 87     Resp 04/10/17 0931 20     Temp 04/10/17 0931 97.6 F (36.4 C)     Temp Source 04/10/17 0931 Oral     SpO2 04/10/17 0931 96 %     Weight 04/10/17 0931 235 lb (106.6 kg)     Height 04/10/17 0931 5\' 2"  (1.575 m)     Head Circumference --      Peak Flow --      Pain Score 04/10/17 0939 7     Pain Loc --      Pain Edu? --      Excl. in GC? --     Constitutional: Alert and oriented. Well appearing and in no distress. Head: Normocephalic and atraumatic. Eyes: Conjunctivae are normal. Neck: Supple.  Hematological/Lymphatic/Immunological: No cervical lymphadenopathy. Cardiovascular: Normal rate, regular rhythm. Normal distal pulses. Respiratory: Normal respiratory effort. No wheezes/rales/rhonchi. Gastrointestinal: Soft and nontender. No distention. Musculoskeletal: Examination of the lower legs there is no gross deformity noted. There is a short leg cast on the right leg. Capillary refill is less than 3 seconds and skin is normal color. On examination of the left leg there is no marked edema present. There is no warmth or erythema present. There is diffuse minimal tenderness on palpation. Pulses positive. Range of motion is without crepitus. Patient is able to bear weight. Neurologic:   Normal speech and language. No gross focal neurologic deficits are appreciated. Skin:  Skin is warm, dry. Psychiatric: Mood and affect are normal. Patient exhibits appropriate insight and judgment. ____________________________________________  INITIAL IMPRESSION / ASSESSMENT AND PLAN / ED COURSE  Patient was made aware that she does not have a DVT. She is happy to go home and continue taking her present prescribed medication from her doctor  in [place]. She'll follow-up with him on Tuesday if any continued problems.    ____________________________________________  FINAL CLINICAL IMPRESSION(S) / ED DIAGNOSES  Final diagnoses:  Acute leg pain, left  Tommi Rumps, PA-C 04/10/17 1300    Arnaldo Natal, MD 04/10/17 [phone]

## 2017-04-10 NOTE — Discharge Instructions (Signed)
Follow up with your doctor in [place] if any continued problems.  Elevate as needed. Continue your medication as home as directed.  Use cane or walker for added support

## 2017-04-12 ENCOUNTER — Other Ambulatory Visit: Payer: Self-pay | Admitting: Family Medicine

## 2017-04-12 ENCOUNTER — Telehealth: Payer: Self-pay

## 2017-04-12 ENCOUNTER — Telehealth: Payer: Self-pay | Admitting: Podiatry

## 2017-04-12 DIAGNOSIS — S86091D Other specified injury of right Achilles tendon, subsequent encounter: Secondary | ICD-10-CM | POA: Diagnosis not present

## 2017-04-12 DIAGNOSIS — G8929 Other chronic pain: Secondary | ICD-10-CM | POA: Diagnosis not present

## 2017-04-12 DIAGNOSIS — G894 Chronic pain syndrome: Secondary | ICD-10-CM | POA: Diagnosis not present

## 2017-04-12 DIAGNOSIS — J449 Chronic obstructive pulmonary disease, unspecified: Secondary | ICD-10-CM | POA: Diagnosis not present

## 2017-04-12 DIAGNOSIS — Z4789 Encounter for other orthopedic aftercare: Secondary | ICD-10-CM | POA: Diagnosis not present

## 2017-04-12 DIAGNOSIS — F1420 Cocaine dependence, uncomplicated: Secondary | ICD-10-CM | POA: Diagnosis not present

## 2017-04-12 DIAGNOSIS — M549 Dorsalgia, unspecified: Secondary | ICD-10-CM | POA: Diagnosis not present

## 2017-04-12 DIAGNOSIS — F332 Major depressive disorder, recurrent severe without psychotic features: Secondary | ICD-10-CM | POA: Diagnosis not present

## 2017-04-12 DIAGNOSIS — K219 Gastro-esophageal reflux disease without esophagitis: Secondary | ICD-10-CM

## 2017-04-12 DIAGNOSIS — I10 Essential (primary) hypertension: Secondary | ICD-10-CM | POA: Diagnosis not present

## 2017-04-12 MED ORDER — HYDROMORPHONE HCL 4 MG PO TABS
4.0000 mg | ORAL_TABLET | ORAL | 0 refills | Status: DC | PRN
Start: 2017-04-12 — End: 2017-04-18

## 2017-04-12 NOTE — Telephone Encounter (Signed)
Patient called said she needs a refill on her pain medication.

## 2017-04-12 NOTE — Telephone Encounter (Signed)
Per Dr. Logan BoresEvans, ok to refill last pain medication.  Rx has been printed, signed and left at front desk for pt to pick up.  Voice mail left on patient answering machine that rx is ready for pick up

## 2017-04-12 NOTE — Telephone Encounter (Signed)
That's fine, refill last pain prescription. Dr. Logan BoresEvans

## 2017-04-12 NOTE — Telephone Encounter (Signed)
Patient called requesting refill of Dilaudid.  Is this ok to refill?  Please advise

## 2017-04-12 NOTE — Telephone Encounter (Signed)
Yeah that's fine. Thanks. Dr. Logan BoresEvans

## 2017-04-13 ENCOUNTER — Other Ambulatory Visit: Payer: Self-pay | Admitting: Podiatry

## 2017-04-13 ENCOUNTER — Other Ambulatory Visit: Payer: Self-pay | Admitting: Family Medicine

## 2017-04-13 DIAGNOSIS — F410 Panic disorder [episodic paroxysmal anxiety] without agoraphobia: Secondary | ICD-10-CM | POA: Diagnosis not present

## 2017-04-13 DIAGNOSIS — F4310 Post-traumatic stress disorder, unspecified: Secondary | ICD-10-CM | POA: Diagnosis not present

## 2017-04-13 DIAGNOSIS — F331 Major depressive disorder, recurrent, moderate: Secondary | ICD-10-CM | POA: Diagnosis not present

## 2017-04-18 ENCOUNTER — Ambulatory Visit (INDEPENDENT_AMBULATORY_CARE_PROVIDER_SITE_OTHER): Payer: Medicare HMO | Admitting: Podiatry

## 2017-04-18 DIAGNOSIS — J449 Chronic obstructive pulmonary disease, unspecified: Secondary | ICD-10-CM | POA: Diagnosis not present

## 2017-04-18 DIAGNOSIS — G894 Chronic pain syndrome: Secondary | ICD-10-CM | POA: Diagnosis not present

## 2017-04-18 DIAGNOSIS — T8131XD Disruption of external operation (surgical) wound, not elsewhere classified, subsequent encounter: Secondary | ICD-10-CM | POA: Diagnosis not present

## 2017-04-18 DIAGNOSIS — S86091D Other specified injury of right Achilles tendon, subsequent encounter: Secondary | ICD-10-CM | POA: Diagnosis not present

## 2017-04-18 DIAGNOSIS — F332 Major depressive disorder, recurrent severe without psychotic features: Secondary | ICD-10-CM | POA: Diagnosis not present

## 2017-04-18 DIAGNOSIS — G8929 Other chronic pain: Secondary | ICD-10-CM | POA: Diagnosis not present

## 2017-04-18 DIAGNOSIS — M549 Dorsalgia, unspecified: Secondary | ICD-10-CM | POA: Diagnosis not present

## 2017-04-18 DIAGNOSIS — I10 Essential (primary) hypertension: Secondary | ICD-10-CM | POA: Diagnosis not present

## 2017-04-18 DIAGNOSIS — F1420 Cocaine dependence, uncomplicated: Secondary | ICD-10-CM | POA: Diagnosis not present

## 2017-04-18 DIAGNOSIS — Z9889 Other specified postprocedural states: Secondary | ICD-10-CM

## 2017-04-18 DIAGNOSIS — Z4789 Encounter for other orthopedic aftercare: Secondary | ICD-10-CM | POA: Diagnosis not present

## 2017-04-18 MED ORDER — HYDROMORPHONE HCL 4 MG PO TABS: 4.0000 mg | ORAL_TABLET | ORAL | 0 refills | Status: DC | PRN

## 2017-04-19 NOTE — Progress Notes (Signed)
Subjective: Patient presents today status post revision repair of right Achilles. DOS 03/09/17. She states her pain is gradually worsening. She reports associated drainage from the wound. She reports she can walk better in shoes. She is requesting a refill on her pain medication.  Objective: Improved amount of ecchymosis and swelling noted throughout the ankle and posterior heel region right lower extremity. Surgical dehiscence noted to the incision site of the posterior aspect of the right foot. Dehiscence appears stable. There is some modest improvement. There is periwound maceration however that is not appear to be a significant amount of cellulitis. No sign of infection.   Wound #1 noted to the right posterior heel1.0 x 4.0 x 0.3 cm (LxWxD).   To the noted ulceration(s), there is no eschar. There is a moderate amount of slough, fibrin, and necrotic tissue noted. Granulation tissue and wound base is red. There is a minimal amount of serosanguineous drainage noted. There is no exposed bone muscle-tendon ligament or joint. There is no malodor. Periwound integrity is intact. Skin is warm, dry and supple bilateral lower extremities.   Assessment: Status post revisional repair of right Achilles. DOS 03/09/17. Dehiscence of transverse incision site.  Plan of care: Today the patient was evaluated. Cultures were taken. Orders for home health dressing changes were placed. Cast removed. Patient instructed to be weightbearing in cam boot. Refill prescription for Dilaudid given to patient. Return to clinic in 3 weeks.  Felecia ShellingBrent M. Evans, DPM [facility]  Dr. Felecia ShellingBrent M. Evans, DPM    [address]                                        [place], [place] [address]                Office [phone]) [phone]  Fax [phone]) [phone]

## 2017-04-20 NOTE — Progress Notes (Signed)
Retrocalcaneal exostectomy right. Repair of achilles right. Ankle arthroscopic synovectomy right

## 2017-04-21 ENCOUNTER — Telehealth: Payer: Self-pay | Admitting: Sports Medicine

## 2017-04-21 ENCOUNTER — Encounter: Payer: Medicare HMO | Admitting: Podiatry

## 2017-04-21 DIAGNOSIS — M549 Dorsalgia, unspecified: Secondary | ICD-10-CM | POA: Diagnosis not present

## 2017-04-21 DIAGNOSIS — Z4789 Encounter for other orthopedic aftercare: Secondary | ICD-10-CM | POA: Diagnosis not present

## 2017-04-21 DIAGNOSIS — F1420 Cocaine dependence, uncomplicated: Secondary | ICD-10-CM | POA: Diagnosis not present

## 2017-04-21 DIAGNOSIS — G894 Chronic pain syndrome: Secondary | ICD-10-CM | POA: Diagnosis not present

## 2017-04-21 DIAGNOSIS — J449 Chronic obstructive pulmonary disease, unspecified: Secondary | ICD-10-CM | POA: Diagnosis not present

## 2017-04-21 DIAGNOSIS — S86091D Other specified injury of right Achilles tendon, subsequent encounter: Secondary | ICD-10-CM | POA: Diagnosis not present

## 2017-04-21 DIAGNOSIS — F332 Major depressive disorder, recurrent severe without psychotic features: Secondary | ICD-10-CM | POA: Diagnosis not present

## 2017-04-21 DIAGNOSIS — I10 Essential (primary) hypertension: Secondary | ICD-10-CM | POA: Diagnosis not present

## 2017-04-21 DIAGNOSIS — G8929 Other chronic pain: Secondary | ICD-10-CM | POA: Diagnosis not present

## 2017-04-21 LAB — WOUND CULTURE

## 2017-04-21 NOTE — Telephone Encounter (Signed)
The order is in the referral tab in her chart.  [facility]

## 2017-04-21 NOTE — Telephone Encounter (Signed)
Amy Physical Therapist from [facility] states that patient has a significant wound with drainage and the dressing has not been changed and wants orders on what should be done to change the dressings. I call Dr. Michel HarrowEvan's and discussed the cast with him and he will have our office nurse Angie to send orders. -Dr. Marylene LandStover

## 2017-04-22 DIAGNOSIS — M549 Dorsalgia, unspecified: Secondary | ICD-10-CM | POA: Diagnosis not present

## 2017-04-22 DIAGNOSIS — G8929 Other chronic pain: Secondary | ICD-10-CM | POA: Diagnosis not present

## 2017-04-22 DIAGNOSIS — Z4789 Encounter for other orthopedic aftercare: Secondary | ICD-10-CM | POA: Diagnosis not present

## 2017-04-22 DIAGNOSIS — F332 Major depressive disorder, recurrent severe without psychotic features: Secondary | ICD-10-CM | POA: Diagnosis not present

## 2017-04-22 DIAGNOSIS — S86091D Other specified injury of right Achilles tendon, subsequent encounter: Secondary | ICD-10-CM | POA: Diagnosis not present

## 2017-04-22 DIAGNOSIS — G894 Chronic pain syndrome: Secondary | ICD-10-CM | POA: Diagnosis not present

## 2017-04-22 DIAGNOSIS — I10 Essential (primary) hypertension: Secondary | ICD-10-CM | POA: Diagnosis not present

## 2017-04-22 DIAGNOSIS — F1420 Cocaine dependence, uncomplicated: Secondary | ICD-10-CM | POA: Diagnosis not present

## 2017-04-22 DIAGNOSIS — J449 Chronic obstructive pulmonary disease, unspecified: Secondary | ICD-10-CM | POA: Diagnosis not present

## 2017-04-24 ENCOUNTER — Telehealth: Payer: Self-pay

## 2017-04-24 ENCOUNTER — Telehealth: Payer: Self-pay | Admitting: Podiatry

## 2017-04-24 MED ORDER — HYDROMORPHONE HCL 4 MG PO TABS: 4.0000 mg | ORAL_TABLET | ORAL | 0 refills | Status: AC | PRN

## 2017-04-24 NOTE — Telephone Encounter (Signed)
Pt called wants to see if she can get her RX refilled.. She also states she is getting around good not using walker.

## 2017-04-24 NOTE — Telephone Encounter (Signed)
rx has been printed and left at front desk for patient to pick up. 

## 2017-04-24 NOTE — Telephone Encounter (Signed)
Pt called wants to see if she can get a refill on her pain medication. She also states she is getting around good not using walker.

## 2017-04-24 NOTE — Telephone Encounter (Signed)
Per Dr. Logan BoresEvans, ok to Refill Dilaudid rx as before.  Rx has been printed and left at front desk for pick up

## 2017-04-26 ENCOUNTER — Telehealth: Payer: Self-pay | Admitting: Podiatry

## 2017-04-26 DIAGNOSIS — F332 Major depressive disorder, recurrent severe without psychotic features: Secondary | ICD-10-CM | POA: Diagnosis not present

## 2017-04-26 DIAGNOSIS — Z4789 Encounter for other orthopedic aftercare: Secondary | ICD-10-CM | POA: Diagnosis not present

## 2017-04-26 DIAGNOSIS — G8929 Other chronic pain: Secondary | ICD-10-CM | POA: Diagnosis not present

## 2017-04-26 DIAGNOSIS — M549 Dorsalgia, unspecified: Secondary | ICD-10-CM | POA: Diagnosis not present

## 2017-04-26 DIAGNOSIS — I10 Essential (primary) hypertension: Secondary | ICD-10-CM | POA: Diagnosis not present

## 2017-04-26 DIAGNOSIS — F1420 Cocaine dependence, uncomplicated: Secondary | ICD-10-CM | POA: Diagnosis not present

## 2017-04-26 DIAGNOSIS — J449 Chronic obstructive pulmonary disease, unspecified: Secondary | ICD-10-CM | POA: Diagnosis not present

## 2017-04-26 DIAGNOSIS — S86091D Other specified injury of right Achilles tendon, subsequent encounter: Secondary | ICD-10-CM | POA: Diagnosis not present

## 2017-04-26 DIAGNOSIS — G894 Chronic pain syndrome: Secondary | ICD-10-CM | POA: Diagnosis not present

## 2017-04-26 NOTE — Telephone Encounter (Signed)
PT CALLED WANTING PAIN MEDS. I INFORMED Jya THAT HER HUSBAND CAME AND PICKED UP HER RX ON Monday WHEN SHE CALLED REPEATINGLY ON MONDAYS

## 2017-04-28 DIAGNOSIS — Z4789 Encounter for other orthopedic aftercare: Secondary | ICD-10-CM | POA: Diagnosis not present

## 2017-04-28 DIAGNOSIS — S86091D Other specified injury of right Achilles tendon, subsequent encounter: Secondary | ICD-10-CM | POA: Diagnosis not present

## 2017-04-28 DIAGNOSIS — G8929 Other chronic pain: Secondary | ICD-10-CM | POA: Diagnosis not present

## 2017-04-28 DIAGNOSIS — I10 Essential (primary) hypertension: Secondary | ICD-10-CM | POA: Diagnosis not present

## 2017-04-28 DIAGNOSIS — F332 Major depressive disorder, recurrent severe without psychotic features: Secondary | ICD-10-CM | POA: Diagnosis not present

## 2017-04-28 DIAGNOSIS — M549 Dorsalgia, unspecified: Secondary | ICD-10-CM | POA: Diagnosis not present

## 2017-04-28 DIAGNOSIS — F1420 Cocaine dependence, uncomplicated: Secondary | ICD-10-CM | POA: Diagnosis not present

## 2017-04-28 DIAGNOSIS — J449 Chronic obstructive pulmonary disease, unspecified: Secondary | ICD-10-CM | POA: Diagnosis not present

## 2017-04-28 DIAGNOSIS — G894 Chronic pain syndrome: Secondary | ICD-10-CM | POA: Diagnosis not present

## 2017-05-01 ENCOUNTER — Telehealth: Payer: Self-pay | Admitting: Podiatry

## 2017-05-01 DIAGNOSIS — M549 Dorsalgia, unspecified: Secondary | ICD-10-CM | POA: Diagnosis not present

## 2017-05-01 DIAGNOSIS — F332 Major depressive disorder, recurrent severe without psychotic features: Secondary | ICD-10-CM | POA: Diagnosis not present

## 2017-05-01 DIAGNOSIS — Z4789 Encounter for other orthopedic aftercare: Secondary | ICD-10-CM | POA: Diagnosis not present

## 2017-05-01 DIAGNOSIS — G8929 Other chronic pain: Secondary | ICD-10-CM | POA: Diagnosis not present

## 2017-05-01 DIAGNOSIS — S86091D Other specified injury of right Achilles tendon, subsequent encounter: Secondary | ICD-10-CM | POA: Diagnosis not present

## 2017-05-01 DIAGNOSIS — G894 Chronic pain syndrome: Secondary | ICD-10-CM | POA: Diagnosis not present

## 2017-05-01 DIAGNOSIS — F1420 Cocaine dependence, uncomplicated: Secondary | ICD-10-CM | POA: Diagnosis not present

## 2017-05-01 DIAGNOSIS — J449 Chronic obstructive pulmonary disease, unspecified: Secondary | ICD-10-CM | POA: Diagnosis not present

## 2017-05-01 DIAGNOSIS — I10 Essential (primary) hypertension: Secondary | ICD-10-CM | POA: Diagnosis not present

## 2017-05-01 NOTE — Telephone Encounter (Signed)
Patient called needs a refill on her pain meds. Advised her Dr. Logan BoresEvans is out of town today but will be back tomorrow.  Patient advised to call when RX is ready for pick up.

## 2017-05-02 ENCOUNTER — Telehealth: Payer: Self-pay

## 2017-05-02 DIAGNOSIS — Z4789 Encounter for other orthopedic aftercare: Secondary | ICD-10-CM | POA: Diagnosis not present

## 2017-05-02 DIAGNOSIS — F332 Major depressive disorder, recurrent severe without psychotic features: Secondary | ICD-10-CM | POA: Diagnosis not present

## 2017-05-02 DIAGNOSIS — G8929 Other chronic pain: Secondary | ICD-10-CM | POA: Diagnosis not present

## 2017-05-02 DIAGNOSIS — G894 Chronic pain syndrome: Secondary | ICD-10-CM | POA: Diagnosis not present

## 2017-05-02 DIAGNOSIS — F1420 Cocaine dependence, uncomplicated: Secondary | ICD-10-CM | POA: Diagnosis not present

## 2017-05-02 DIAGNOSIS — I10 Essential (primary) hypertension: Secondary | ICD-10-CM | POA: Diagnosis not present

## 2017-05-02 DIAGNOSIS — S86091D Other specified injury of right Achilles tendon, subsequent encounter: Secondary | ICD-10-CM | POA: Diagnosis not present

## 2017-05-02 DIAGNOSIS — J449 Chronic obstructive pulmonary disease, unspecified: Secondary | ICD-10-CM | POA: Diagnosis not present

## 2017-05-02 DIAGNOSIS — M549 Dorsalgia, unspecified: Secondary | ICD-10-CM | POA: Diagnosis not present

## 2017-05-02 MED ORDER — OXYCODONE-ACETAMINOPHEN 5-325 MG PO TABS: 1.0000 | ORAL_TABLET | Freq: Four times a day (QID) | ORAL | 0 refills | Status: DC | PRN

## 2017-05-02 NOTE — Telephone Encounter (Signed)
Patient called in requesting a refill of her pain medication.  Per Dr. Evans, he wants to wean her off the Dilaudid.  Patient states she can take Percocet without nausea.     Per Dr. Evans, ok to give her Percocet 5/325 #30.  Patient is aware of rx and will pick up at front desk.    Rx printed and signed, left at front desk for pickup 

## 2017-05-02 NOTE — Telephone Encounter (Signed)
Patient called in requesting a refill of her pain medication.  Per Dr. Logan BoresEvans, he wants to wean her off the Dilaudid.  Patient states she can take Percocet without nausea.     Per Dr. Logan BoresEvans, ok to give her Percocet 5/325 #30.  Patient is aware of rx and will pick up at front desk.    Rx printed and signed, left at front desk for pickup

## 2017-05-03 DIAGNOSIS — F332 Major depressive disorder, recurrent severe without psychotic features: Secondary | ICD-10-CM | POA: Diagnosis not present

## 2017-05-03 DIAGNOSIS — I10 Essential (primary) hypertension: Secondary | ICD-10-CM | POA: Diagnosis not present

## 2017-05-03 DIAGNOSIS — Z4789 Encounter for other orthopedic aftercare: Secondary | ICD-10-CM | POA: Diagnosis not present

## 2017-05-03 DIAGNOSIS — J449 Chronic obstructive pulmonary disease, unspecified: Secondary | ICD-10-CM | POA: Diagnosis not present

## 2017-05-03 DIAGNOSIS — G894 Chronic pain syndrome: Secondary | ICD-10-CM | POA: Diagnosis not present

## 2017-05-03 DIAGNOSIS — M549 Dorsalgia, unspecified: Secondary | ICD-10-CM | POA: Diagnosis not present

## 2017-05-03 DIAGNOSIS — F1420 Cocaine dependence, uncomplicated: Secondary | ICD-10-CM | POA: Diagnosis not present

## 2017-05-03 DIAGNOSIS — G8929 Other chronic pain: Secondary | ICD-10-CM | POA: Diagnosis not present

## 2017-05-03 DIAGNOSIS — S86091D Other specified injury of right Achilles tendon, subsequent encounter: Secondary | ICD-10-CM | POA: Diagnosis not present

## 2017-05-04 DIAGNOSIS — M549 Dorsalgia, unspecified: Secondary | ICD-10-CM | POA: Diagnosis not present

## 2017-05-04 DIAGNOSIS — F332 Major depressive disorder, recurrent severe without psychotic features: Secondary | ICD-10-CM | POA: Diagnosis not present

## 2017-05-04 DIAGNOSIS — J449 Chronic obstructive pulmonary disease, unspecified: Secondary | ICD-10-CM | POA: Diagnosis not present

## 2017-05-04 DIAGNOSIS — Z4789 Encounter for other orthopedic aftercare: Secondary | ICD-10-CM | POA: Diagnosis not present

## 2017-05-04 DIAGNOSIS — F1420 Cocaine dependence, uncomplicated: Secondary | ICD-10-CM | POA: Diagnosis not present

## 2017-05-04 DIAGNOSIS — G894 Chronic pain syndrome: Secondary | ICD-10-CM | POA: Diagnosis not present

## 2017-05-04 DIAGNOSIS — S86091D Other specified injury of right Achilles tendon, subsequent encounter: Secondary | ICD-10-CM | POA: Diagnosis not present

## 2017-05-04 DIAGNOSIS — G8929 Other chronic pain: Secondary | ICD-10-CM | POA: Diagnosis not present

## 2017-05-04 DIAGNOSIS — I10 Essential (primary) hypertension: Secondary | ICD-10-CM | POA: Diagnosis not present

## 2017-05-05 DIAGNOSIS — G894 Chronic pain syndrome: Secondary | ICD-10-CM | POA: Diagnosis not present

## 2017-05-05 DIAGNOSIS — Z4789 Encounter for other orthopedic aftercare: Secondary | ICD-10-CM | POA: Diagnosis not present

## 2017-05-05 DIAGNOSIS — F332 Major depressive disorder, recurrent severe without psychotic features: Secondary | ICD-10-CM | POA: Diagnosis not present

## 2017-05-05 DIAGNOSIS — S86091D Other specified injury of right Achilles tendon, subsequent encounter: Secondary | ICD-10-CM | POA: Diagnosis not present

## 2017-05-05 DIAGNOSIS — M549 Dorsalgia, unspecified: Secondary | ICD-10-CM | POA: Diagnosis not present

## 2017-05-05 DIAGNOSIS — J449 Chronic obstructive pulmonary disease, unspecified: Secondary | ICD-10-CM | POA: Diagnosis not present

## 2017-05-05 DIAGNOSIS — F1420 Cocaine dependence, uncomplicated: Secondary | ICD-10-CM | POA: Diagnosis not present

## 2017-05-05 DIAGNOSIS — I10 Essential (primary) hypertension: Secondary | ICD-10-CM | POA: Diagnosis not present

## 2017-05-05 DIAGNOSIS — G8929 Other chronic pain: Secondary | ICD-10-CM | POA: Diagnosis not present

## 2017-05-06 ENCOUNTER — Other Ambulatory Visit: Payer: Self-pay | Admitting: Family Medicine

## 2017-05-06 DIAGNOSIS — K219 Gastro-esophageal reflux disease without esophagitis: Secondary | ICD-10-CM

## 2017-05-08 DIAGNOSIS — F1420 Cocaine dependence, uncomplicated: Secondary | ICD-10-CM | POA: Diagnosis not present

## 2017-05-08 DIAGNOSIS — G894 Chronic pain syndrome: Secondary | ICD-10-CM | POA: Diagnosis not present

## 2017-05-08 DIAGNOSIS — I10 Essential (primary) hypertension: Secondary | ICD-10-CM | POA: Diagnosis not present

## 2017-05-08 DIAGNOSIS — J449 Chronic obstructive pulmonary disease, unspecified: Secondary | ICD-10-CM | POA: Diagnosis not present

## 2017-05-08 DIAGNOSIS — Z4789 Encounter for other orthopedic aftercare: Secondary | ICD-10-CM | POA: Diagnosis not present

## 2017-05-08 DIAGNOSIS — M549 Dorsalgia, unspecified: Secondary | ICD-10-CM | POA: Diagnosis not present

## 2017-05-08 DIAGNOSIS — S86091D Other specified injury of right Achilles tendon, subsequent encounter: Secondary | ICD-10-CM | POA: Diagnosis not present

## 2017-05-08 DIAGNOSIS — F332 Major depressive disorder, recurrent severe without psychotic features: Secondary | ICD-10-CM | POA: Diagnosis not present

## 2017-05-08 DIAGNOSIS — G8929 Other chronic pain: Secondary | ICD-10-CM | POA: Diagnosis not present

## 2017-05-09 ENCOUNTER — Ambulatory Visit (INDEPENDENT_AMBULATORY_CARE_PROVIDER_SITE_OTHER): Payer: Medicare HMO | Admitting: Podiatry

## 2017-05-09 ENCOUNTER — Ambulatory Visit (INDEPENDENT_AMBULATORY_CARE_PROVIDER_SITE_OTHER): Payer: Medicare HMO

## 2017-05-09 ENCOUNTER — Ambulatory Visit: Payer: Medicare HMO

## 2017-05-09 DIAGNOSIS — M79671 Pain in right foot: Secondary | ICD-10-CM

## 2017-05-09 DIAGNOSIS — Z9889 Other specified postprocedural states: Secondary | ICD-10-CM

## 2017-05-09 MED ORDER — OXYCODONE-ACETAMINOPHEN 5-325 MG PO TABS: 1.0000 | ORAL_TABLET | Freq: Four times a day (QID) | ORAL | 0 refills | Status: DC | PRN

## 2017-05-10 DIAGNOSIS — G8929 Other chronic pain: Secondary | ICD-10-CM | POA: Diagnosis not present

## 2017-05-10 DIAGNOSIS — F332 Major depressive disorder, recurrent severe without psychotic features: Secondary | ICD-10-CM | POA: Diagnosis not present

## 2017-05-10 DIAGNOSIS — J449 Chronic obstructive pulmonary disease, unspecified: Secondary | ICD-10-CM | POA: Diagnosis not present

## 2017-05-10 DIAGNOSIS — F1420 Cocaine dependence, uncomplicated: Secondary | ICD-10-CM | POA: Diagnosis not present

## 2017-05-10 DIAGNOSIS — G894 Chronic pain syndrome: Secondary | ICD-10-CM | POA: Diagnosis not present

## 2017-05-10 DIAGNOSIS — I10 Essential (primary) hypertension: Secondary | ICD-10-CM | POA: Diagnosis not present

## 2017-05-10 DIAGNOSIS — S86091D Other specified injury of right Achilles tendon, subsequent encounter: Secondary | ICD-10-CM | POA: Diagnosis not present

## 2017-05-10 DIAGNOSIS — Z4789 Encounter for other orthopedic aftercare: Secondary | ICD-10-CM | POA: Diagnosis not present

## 2017-05-10 DIAGNOSIS — M549 Dorsalgia, unspecified: Secondary | ICD-10-CM | POA: Diagnosis not present

## 2017-05-10 NOTE — Progress Notes (Signed)
Subjective: Patient presents today status post revision repair of right Achilles. DOS 03/09/17. She states her pain has improved however she is still experiencing burning pain to the area. She denies any new complaints at this time.  Objective: Improved amount of ecchymosis and swelling noted throughout the ankle and posterior heel region right lower extremity. Surgical dehiscence noted to the incision site of the posterior aspect of the right foot. Dehiscence appears stable. There is some modest improvement. There is periwound maceration however that is not appear to be a significant amount of cellulitis. No sign of infection.   Wound #1 noted to the right posterior heel measuring 1.5 x 1.0 x 0.2 cm (LxWxD).   To the noted ulceration(s), there is no eschar. There is a moderate amount of slough, fibrin, and necrotic tissue noted. Granulation tissue and wound base is red. There is a minimal amount of serosanguineous drainage noted. There is no exposed bone muscle-tendon ligament or joint. There is no malodor. Periwound integrity is intact. Skin is warm, dry and supple bilateral lower extremities.   Assessment: Status post revisional repair of right Achilles. DOS 03/09/17. Dehiscence of transverse incision site.  Plan of care: Today the patient was evaluated. Medically necessary excisional debridement including subcutaneous tissue was performed using a tissue nipper and a chisel blade. Excisional debridement of all the necrotic nonviable tissue down to healthy bleeding viable tissue was performed with post-debridement measurements same as pre-. The wound was cleansed and dry sterile dressing applied. Continue home health dressing changes every other day. Prescription for Percocet 5/325 mg given to patient. Return to clinic in two weeks.    Felecia ShellingBrent M. Kristan Brummitt, DPM [facility]  Dr. Felecia ShellingBrent M. Don Tiu, DPM    [address]                                        [place], [place] [address]                 Office [phone]  Fax [phone]

## 2017-05-11 NOTE — Telephone Encounter (Signed)
No, no concerns. Thanks, Dr. Logan BoresEvans

## 2017-05-12 ENCOUNTER — Telehealth: Payer: Self-pay | Admitting: Podiatry

## 2017-05-12 DIAGNOSIS — J449 Chronic obstructive pulmonary disease, unspecified: Secondary | ICD-10-CM | POA: Diagnosis not present

## 2017-05-12 DIAGNOSIS — G894 Chronic pain syndrome: Secondary | ICD-10-CM | POA: Diagnosis not present

## 2017-05-12 DIAGNOSIS — S86091D Other specified injury of right Achilles tendon, subsequent encounter: Secondary | ICD-10-CM | POA: Diagnosis not present

## 2017-05-12 DIAGNOSIS — M549 Dorsalgia, unspecified: Secondary | ICD-10-CM | POA: Diagnosis not present

## 2017-05-12 DIAGNOSIS — G8929 Other chronic pain: Secondary | ICD-10-CM | POA: Diagnosis not present

## 2017-05-12 DIAGNOSIS — F332 Major depressive disorder, recurrent severe without psychotic features: Secondary | ICD-10-CM | POA: Diagnosis not present

## 2017-05-12 DIAGNOSIS — F1420 Cocaine dependence, uncomplicated: Secondary | ICD-10-CM | POA: Diagnosis not present

## 2017-05-12 DIAGNOSIS — I10 Essential (primary) hypertension: Secondary | ICD-10-CM | POA: Diagnosis not present

## 2017-05-12 DIAGNOSIS — Z4789 Encounter for other orthopedic aftercare: Secondary | ICD-10-CM | POA: Diagnosis not present

## 2017-05-12 NOTE — Telephone Encounter (Signed)
Yes that is fine. Thanks, Dr. Logan BoresEvans

## 2017-05-12 NOTE — Telephone Encounter (Signed)
Dr. Philomena DohenyEvans okayed patient to do short distance walking at home. I was wondering if we could do active range of motion on her foot without the boot to prepare her for this. If you could, please call me back at [phone]. Thank you.

## 2017-05-15 ENCOUNTER — Telehealth: Payer: Self-pay

## 2017-05-15 ENCOUNTER — Telehealth: Payer: Self-pay | Admitting: Podiatry

## 2017-05-15 DIAGNOSIS — F332 Major depressive disorder, recurrent severe without psychotic features: Secondary | ICD-10-CM | POA: Diagnosis not present

## 2017-05-15 DIAGNOSIS — S86091D Other specified injury of right Achilles tendon, subsequent encounter: Secondary | ICD-10-CM | POA: Diagnosis not present

## 2017-05-15 DIAGNOSIS — J449 Chronic obstructive pulmonary disease, unspecified: Secondary | ICD-10-CM | POA: Diagnosis not present

## 2017-05-15 DIAGNOSIS — F1420 Cocaine dependence, uncomplicated: Secondary | ICD-10-CM | POA: Diagnosis not present

## 2017-05-15 DIAGNOSIS — M549 Dorsalgia, unspecified: Secondary | ICD-10-CM | POA: Diagnosis not present

## 2017-05-15 DIAGNOSIS — G8929 Other chronic pain: Secondary | ICD-10-CM | POA: Diagnosis not present

## 2017-05-15 DIAGNOSIS — Z4789 Encounter for other orthopedic aftercare: Secondary | ICD-10-CM | POA: Diagnosis not present

## 2017-05-15 DIAGNOSIS — I10 Essential (primary) hypertension: Secondary | ICD-10-CM | POA: Diagnosis not present

## 2017-05-15 DIAGNOSIS — G894 Chronic pain syndrome: Secondary | ICD-10-CM | POA: Diagnosis not present

## 2017-05-15 NOTE — Telephone Encounter (Signed)
Patient called needs a refill on her pain meds. Also she has not received any wound care supplies that Dr. Logan BoresEVans was to have ordered on her last visit. She said that her bandaging needs to be changed frequently and she cannot afford to pay out of pocket for something that her insurance should be covering. Please call when RX is ready for pick up. Call her at the home humber to advise her of the wound care suplies.

## 2017-05-15 NOTE — Telephone Encounter (Signed)
Attempted to call Amy to inform her of Dr Logan BoresEvans ok to proceed with ROM exercises for PT, but was unable to LVM

## 2017-05-16 ENCOUNTER — Telehealth: Payer: Self-pay

## 2017-05-16 MED ORDER — OXYCODONE-ACETAMINOPHEN 5-325 MG PO TABS: 1.0000 | ORAL_TABLET | Freq: Four times a day (QID) | ORAL | 0 refills | Status: DC | PRN

## 2017-05-16 NOTE — Telephone Encounter (Signed)
Patient called requesting a refill of her pain medication.  Per Dr. Logan BoresEvans, ok to refill Oxycodone-apap 5/325mg .  Rx has been printed and left at front desk for patient pick up

## 2017-05-16 NOTE — Telephone Encounter (Signed)
16 May 2017 at 11:25 am tried calling number [phone] to let them know of Dr. Logan BoresEvans answer. No voicemail has been set up. Will try calling back later.

## 2017-05-16 NOTE — Telephone Encounter (Signed)
Per Dr. Logan BoresEvans, ok to refill Oxycodone-apap 5/325mg .  Rx has been printed and left at front desk for patient pick up

## 2017-05-18 DIAGNOSIS — S86091D Other specified injury of right Achilles tendon, subsequent encounter: Secondary | ICD-10-CM | POA: Diagnosis not present

## 2017-05-18 DIAGNOSIS — G8929 Other chronic pain: Secondary | ICD-10-CM | POA: Diagnosis not present

## 2017-05-18 DIAGNOSIS — M549 Dorsalgia, unspecified: Secondary | ICD-10-CM | POA: Diagnosis not present

## 2017-05-18 DIAGNOSIS — Z4789 Encounter for other orthopedic aftercare: Secondary | ICD-10-CM | POA: Diagnosis not present

## 2017-05-18 DIAGNOSIS — G894 Chronic pain syndrome: Secondary | ICD-10-CM | POA: Diagnosis not present

## 2017-05-18 DIAGNOSIS — J449 Chronic obstructive pulmonary disease, unspecified: Secondary | ICD-10-CM | POA: Diagnosis not present

## 2017-05-18 DIAGNOSIS — F1420 Cocaine dependence, uncomplicated: Secondary | ICD-10-CM | POA: Diagnosis not present

## 2017-05-18 DIAGNOSIS — I10 Essential (primary) hypertension: Secondary | ICD-10-CM | POA: Diagnosis not present

## 2017-05-18 DIAGNOSIS — F332 Major depressive disorder, recurrent severe without psychotic features: Secondary | ICD-10-CM | POA: Diagnosis not present

## 2017-05-19 DIAGNOSIS — I10 Essential (primary) hypertension: Secondary | ICD-10-CM | POA: Diagnosis not present

## 2017-05-19 DIAGNOSIS — Z4789 Encounter for other orthopedic aftercare: Secondary | ICD-10-CM | POA: Diagnosis not present

## 2017-05-19 DIAGNOSIS — S86091D Other specified injury of right Achilles tendon, subsequent encounter: Secondary | ICD-10-CM | POA: Diagnosis not present

## 2017-05-19 DIAGNOSIS — G894 Chronic pain syndrome: Secondary | ICD-10-CM | POA: Diagnosis not present

## 2017-05-19 DIAGNOSIS — M549 Dorsalgia, unspecified: Secondary | ICD-10-CM | POA: Diagnosis not present

## 2017-05-19 DIAGNOSIS — G8929 Other chronic pain: Secondary | ICD-10-CM | POA: Diagnosis not present

## 2017-05-19 DIAGNOSIS — F332 Major depressive disorder, recurrent severe without psychotic features: Secondary | ICD-10-CM | POA: Diagnosis not present

## 2017-05-19 DIAGNOSIS — J449 Chronic obstructive pulmonary disease, unspecified: Secondary | ICD-10-CM | POA: Diagnosis not present

## 2017-05-19 DIAGNOSIS — F1420 Cocaine dependence, uncomplicated: Secondary | ICD-10-CM | POA: Diagnosis not present

## 2017-05-22 ENCOUNTER — Telehealth: Payer: Self-pay | Admitting: Podiatry

## 2017-05-22 DIAGNOSIS — M549 Dorsalgia, unspecified: Secondary | ICD-10-CM | POA: Diagnosis not present

## 2017-05-22 DIAGNOSIS — J449 Chronic obstructive pulmonary disease, unspecified: Secondary | ICD-10-CM | POA: Diagnosis not present

## 2017-05-22 DIAGNOSIS — G894 Chronic pain syndrome: Secondary | ICD-10-CM | POA: Diagnosis not present

## 2017-05-22 DIAGNOSIS — F332 Major depressive disorder, recurrent severe without psychotic features: Secondary | ICD-10-CM | POA: Diagnosis not present

## 2017-05-22 DIAGNOSIS — Z4789 Encounter for other orthopedic aftercare: Secondary | ICD-10-CM | POA: Diagnosis not present

## 2017-05-22 DIAGNOSIS — G8929 Other chronic pain: Secondary | ICD-10-CM | POA: Diagnosis not present

## 2017-05-22 DIAGNOSIS — I10 Essential (primary) hypertension: Secondary | ICD-10-CM | POA: Diagnosis not present

## 2017-05-22 DIAGNOSIS — S86091D Other specified injury of right Achilles tendon, subsequent encounter: Secondary | ICD-10-CM | POA: Diagnosis not present

## 2017-05-22 DIAGNOSIS — F1420 Cocaine dependence, uncomplicated: Secondary | ICD-10-CM | POA: Diagnosis not present

## 2017-05-22 NOTE — Telephone Encounter (Signed)
Patient called to get a refill on her pain medication. Also, patient has not heard anything from anyone about wound care.  Sherry from [facility] came out and changed the bandaing on her wound, but she only applied paper tape to the bandage and it is not holding in place, its falling down. Patient has requested a roll of coflex which I will dispense to her at $5 per roll. She is supposed to have a wound care kit with all of her products sent to her home, but it has not arrived yet. Wound is still bleeding a little. Would like nurse to call her about this.

## 2017-05-22 NOTE — Telephone Encounter (Signed)
She can pick some more pain medication up tomorrow at the office. She is not due until tomorrow (05-23-17). At that time she can pick up the Coflex as well.  Thanks, Dr. Logan BoresEvans

## 2017-05-22 NOTE — Telephone Encounter (Signed)
Patient called requesting a refill on her pain meds.  Also she was supposed to have gotten a wound care kit and that has never arrived.  Patient said that Judeen Hammans from [facility] the home health nurse came out and changed her bandaging and did not wrap the bandage and its falling off. Patient will pick up a roll of coflex when she gets the RX.

## 2017-05-22 NOTE — Telephone Encounter (Signed)
This is Amy Waynetta SandyMoon, a physical therapist with [facility]. I was calling to let Dr. Logan BoresEvans know we missed our visit with Rebecca Ramos on Friday 06 July due to scheduling difficulties. Would like a verbal order to reschedule to the week of 16 July. Then, if he has any protocol he wants me to follow for ankle range of motion, and mobility, or strengthening please let me know. You can reach me at [phone].

## 2017-05-23 DIAGNOSIS — M549 Dorsalgia, unspecified: Secondary | ICD-10-CM | POA: Diagnosis not present

## 2017-05-23 DIAGNOSIS — F332 Major depressive disorder, recurrent severe without psychotic features: Secondary | ICD-10-CM | POA: Diagnosis not present

## 2017-05-23 DIAGNOSIS — S86091D Other specified injury of right Achilles tendon, subsequent encounter: Secondary | ICD-10-CM | POA: Diagnosis not present

## 2017-05-23 DIAGNOSIS — G8929 Other chronic pain: Secondary | ICD-10-CM | POA: Diagnosis not present

## 2017-05-23 DIAGNOSIS — J449 Chronic obstructive pulmonary disease, unspecified: Secondary | ICD-10-CM | POA: Diagnosis not present

## 2017-05-23 DIAGNOSIS — Z4789 Encounter for other orthopedic aftercare: Secondary | ICD-10-CM | POA: Diagnosis not present

## 2017-05-23 DIAGNOSIS — I10 Essential (primary) hypertension: Secondary | ICD-10-CM | POA: Diagnosis not present

## 2017-05-23 DIAGNOSIS — F1420 Cocaine dependence, uncomplicated: Secondary | ICD-10-CM | POA: Diagnosis not present

## 2017-05-23 DIAGNOSIS — G894 Chronic pain syndrome: Secondary | ICD-10-CM | POA: Diagnosis not present

## 2017-05-24 ENCOUNTER — Ambulatory Visit: Payer: Medicare HMO

## 2017-05-24 ENCOUNTER — Ambulatory Visit (INDEPENDENT_AMBULATORY_CARE_PROVIDER_SITE_OTHER): Payer: Medicare HMO | Admitting: Podiatry

## 2017-05-24 DIAGNOSIS — Z9889 Other specified postprocedural states: Secondary | ICD-10-CM

## 2017-05-24 DIAGNOSIS — J449 Chronic obstructive pulmonary disease, unspecified: Secondary | ICD-10-CM | POA: Diagnosis not present

## 2017-05-24 DIAGNOSIS — S86091D Other specified injury of right Achilles tendon, subsequent encounter: Secondary | ICD-10-CM | POA: Diagnosis not present

## 2017-05-24 DIAGNOSIS — F332 Major depressive disorder, recurrent severe without psychotic features: Secondary | ICD-10-CM | POA: Diagnosis not present

## 2017-05-24 DIAGNOSIS — Z4789 Encounter for other orthopedic aftercare: Secondary | ICD-10-CM | POA: Diagnosis not present

## 2017-05-24 DIAGNOSIS — I10 Essential (primary) hypertension: Secondary | ICD-10-CM | POA: Diagnosis not present

## 2017-05-24 DIAGNOSIS — G8929 Other chronic pain: Secondary | ICD-10-CM | POA: Diagnosis not present

## 2017-05-24 DIAGNOSIS — M25471 Effusion, right ankle: Secondary | ICD-10-CM | POA: Diagnosis not present

## 2017-05-24 DIAGNOSIS — M549 Dorsalgia, unspecified: Secondary | ICD-10-CM | POA: Diagnosis not present

## 2017-05-24 DIAGNOSIS — F1420 Cocaine dependence, uncomplicated: Secondary | ICD-10-CM | POA: Diagnosis not present

## 2017-05-24 DIAGNOSIS — G894 Chronic pain syndrome: Secondary | ICD-10-CM | POA: Diagnosis not present

## 2017-05-25 NOTE — Telephone Encounter (Signed)
Nope, discontinue PT. Thanks, Dr. Logan BoresEvans

## 2017-05-26 ENCOUNTER — Other Ambulatory Visit: Payer: Self-pay | Admitting: Podiatry

## 2017-05-26 ENCOUNTER — Encounter: Payer: Medicare HMO | Admitting: Podiatry

## 2017-05-30 ENCOUNTER — Other Ambulatory Visit: Payer: Self-pay | Admitting: Podiatry

## 2017-05-30 ENCOUNTER — Ambulatory Visit (INDEPENDENT_AMBULATORY_CARE_PROVIDER_SITE_OTHER): Payer: Medicare HMO | Admitting: Podiatry

## 2017-05-30 DIAGNOSIS — Z9889 Other specified postprocedural states: Secondary | ICD-10-CM

## 2017-05-30 DIAGNOSIS — M66871 Spontaneous rupture of other tendons, right ankle and foot: Secondary | ICD-10-CM | POA: Diagnosis not present

## 2017-05-30 DIAGNOSIS — M65871 Other synovitis and tenosynovitis, right ankle and foot: Secondary | ICD-10-CM

## 2017-05-30 MED ORDER — OXYCODONE-ACETAMINOPHEN 5-325 MG PO TABS: 1.0000 | ORAL_TABLET | Freq: Four times a day (QID) | ORAL | 0 refills | Status: DC | PRN

## 2017-05-30 MED ORDER — MELOXICAM 15 MG PO TABS: 15.0000 mg | ORAL_TABLET | Freq: Every day | ORAL | 1 refills | Status: DC

## 2017-05-30 NOTE — Progress Notes (Signed)
   HPI: Patient presents today status post retrocalcaneal exostectomy with repair of Achilles tendon revisional surgery. Date of surgery 03/09/2017. She states that she's doing much better in the [facility] which was applied on last visit helped significantly. She believes the swelling has now gone down. She has discontinued physical therapy as discussed on the last visit.     Physical Exam: General: The patient is alert and oriented x3 in no acute distress.  Dermatology: Very superficial ulceration noted to the posterior heel. It measures approximately 0.20.20.1 cm. Skin is warm, dry and supple bilateral lower extremities. Negative for open lesions or macerations.  Vascular: Palpable pedal pulses bilaterally. No edema or erythema noted. Capillary refill within normal limits.  Neurological: Epicritic and protective threshold grossly intact bilaterally.   Musculoskeletal Exam: Range of motion within normal limits to all pedal and ankle joints bilateral. Muscle strength 5/5 in all groups bilateral.    Assessment: 1. Status post retrocalcaneal exostectomy with repair of Achilles tendon. Date of surgery 03/09/2017. 2. Superficial ulceration posterior heel right  Plan of Care:  1. Patient was evaluated. 2. Light debridement was performed to the superficial ulceration using a tissue nipper 3. Today a multilayer Unna boot compression wrap was applied to the right lower extremity 4. Postoperative shoe was dispensed today 5. Today wearing a refill her prescription for Mobic 15 mg 6. Refill prescription for Percocet 05/325mg  7. Return to clinic in 3 weeks   Felecia ShellingBrent M. Demetry Bendickson, DPM [facility]  Dr. Felecia ShellingBrent M. Eliasar Hlavaty, DPM    [address]                                        [place], [place] [address]                Office [phone]  Fax [phone]

## 2017-06-04 NOTE — Progress Notes (Signed)
   HPI: Patient presents today status post revisional repair of right Achilles tendon. Date of surgery 03/09/2017. She believes that she's doing much better but she believes that physical therapy is causing more pain. She believes that the physical therapist is much too aggressive and has noticed significant swelling and tenderness due to physical therapy. She presents today for further treatment and evaluation   Physical Exam: General: The patient is alert and oriented x3 in no acute distress.  Dermatology: Skin is warm, dry and supple bilateral lower extremities. Negative for open lesions or macerations.  Vascular: Palpable pedal pulses bilaterally. No edema or erythema noted. Capillary refill within normal limits.  Neurological: Epicritic and protective threshold grossly intact bilaterally.   Musculoskeletal Exam: There is a moderate edema noted more prominent since last visit to the right ankle. Increased pain on palpation noted to the right foot and ankle. Range of motion within normal limits to all pedal and ankle joints bilateral. Muscle strength 5/5 in all groups bilateral.   Assessment: 1. Status post revisional repair of right Achilles tendon. Date of surgery 03/09/2017. 2. Edema right ankle   Plan of Care:  1. Patient was evaluated. 2. Today instructed the patient to discontinue physical therapy due to the aggressive nature of the therapy. Today an Press photographerUnna boot multilayer compression wrap was applied to the right lower extremity 3. The patient can slowly transition into tennis shoes from immobilization cam boot 4. Return to clinic in 2 weeks   Felecia ShellingBrent M. Evans, DPM [facility]  Dr. Felecia ShellingBrent M. Evans, DPM    [address]                                        [place], [place] [address]                Office [phone]  Fax [phone]

## 2017-06-05 ENCOUNTER — Telehealth: Payer: Self-pay | Admitting: Podiatry

## 2017-06-05 NOTE — Telephone Encounter (Signed)
Patient called stating she needs a refill on her pain meds.   Also asking for a compression sock to wear on her surgical foot. Wanting more support since the cast has been removed. Call when RX is ready for pick up. Her next appt is not until 06/21/17.

## 2017-06-05 NOTE — Telephone Encounter (Signed)
Pt called stating she has two questions. The first is that she is requesting a refill on her pain medication. Also has a question about doing some walking since she just got out of her soft cast. Wants to know if she can come get a compression stocking from the office.

## 2017-06-06 ENCOUNTER — Telehealth: Payer: Self-pay

## 2017-06-06 ENCOUNTER — Telehealth: Payer: Self-pay | Admitting: Podiatry

## 2017-06-06 DIAGNOSIS — M79673 Pain in unspecified foot: Secondary | ICD-10-CM

## 2017-06-06 MED ORDER — OXYCODONE-ACETAMINOPHEN 5-325 MG PO TABS: 1.0000 | ORAL_TABLET | Freq: Four times a day (QID) | ORAL | 0 refills | Status: DC | PRN

## 2017-06-06 MED ORDER — NONFORMULARY OR COMPOUNDED ITEM: 2 refills | Status: AC

## 2017-06-06 NOTE — Telephone Encounter (Signed)
Per Dr. Logan BoresEvans, ok to refill her Percocet.  Also rx has been [facility] for Combination pain cream.  Patient can pick up compression stocking with pain rx.     Honeycutt, Tammy R Yesterday (8:11 AM)      Patient called stating she needs a refill on her pain meds.   Also asking for a compression sock to wear on her surgical foot. Wanting more support since the cast has been removed. Call when RX is ready for pick up. Her next appt is not until 06/21/17.

## 2017-06-06 NOTE — Telephone Encounter (Signed)
Per Dr. Logan BoresEvans, ok to refill Percocet 5/325 #30     Rx has been printed and given to patient at front desk

## 2017-06-06 NOTE — Telephone Encounter (Signed)
Patient called today asking for some type of muscle cream to rub on her calf's for pain. Dr. Logan BoresEvans is going to call into [facility] a compound cream for her. Also she wanted to know if she could ride a bike around her apartment complex for exercise?  Per Dr. Logan BoresEvans, okay but dont over do it.   Patient also requested a compression anklet to wear on her surgical foot. Per Dr. Logan BoresEvans ok to dispense when she comes in to pick up her pain med rx today.

## 2017-06-08 ENCOUNTER — Telehealth: Payer: Self-pay | Admitting: Internal Medicine

## 2017-06-08 NOTE — Telephone Encounter (Signed)
3 attempts to schedule fu appt from recall list.   Deleting recall. Patient had surgery and cannot get transportation

## 2017-06-08 NOTE — Telephone Encounter (Signed)
Patient was given an ankle compression wrap in office

## 2017-06-09 ENCOUNTER — Telehealth: Payer: Self-pay

## 2017-06-09 MED ORDER — TRAMADOL HCL 50 MG PO TABS: 50.0000 mg | ORAL_TABLET | Freq: Four times a day (QID) | ORAL | 0 refills | Status: AC | PRN

## 2017-06-09 NOTE — Telephone Encounter (Signed)
[facility] requesting refill on Tramadol.  Per Dr. Logan BoresEvans, ok to refill Tramadol #30 0rf.   Rx has been faxed [facility] to [facility]

## 2017-06-20 ENCOUNTER — Ambulatory Visit: Payer: Medicare HMO | Admitting: Podiatry

## 2017-07-04 ENCOUNTER — Telehealth: Payer: Self-pay | Admitting: Podiatry

## 2017-07-04 NOTE — Telephone Encounter (Signed)
Yes, that's fine! Thanks, Dr. Taiz Bickle

## 2017-07-04 NOTE — Telephone Encounter (Signed)
Rebecca Ramos with [facility] Nurse called with questions regarding pt's orders. Stated Dr. Logan Bores know has pt using triple antibiotic ointment and a band-aid on the achilles wound. I wanted to know if it was okay to doing the moist collagen and padding it to give it some cushion since it is red, draining, and swollen.

## 2017-07-05 ENCOUNTER — Other Ambulatory Visit: Payer: Self-pay | Admitting: Podiatry

## 2017-07-05 NOTE — Telephone Encounter (Signed)
Sherrie with [facility] notified via voice mail that per Dr. Logan Bores, it is ok for the moist collagen and padding for the dressing.

## 2017-07-13 IMAGING — CT CT CHEST W/O CM
2 of 3 series · 15 of 36 positions shown, 18 images · non-contrast
Comparison: Radiography 01/08/2015.  Chest CT 01/10/2015.

CLINICAL DATA: Pneumonia diagnosed in [REDACTED]. Some persistent
shortness of breath with exertion.

EXAM:
CT CHEST WITHOUT CONTRAST
TECHNIQUE: Multidetector CT imaging of the chest was performed following the
standard protocol without IV contrast.

[Series 2: routine chest wo · axial · 0.70mm/px · z∈[-585,-330]mm · 12 of 61 slices shown, 15 images]
[im 5/61  mediastinal]
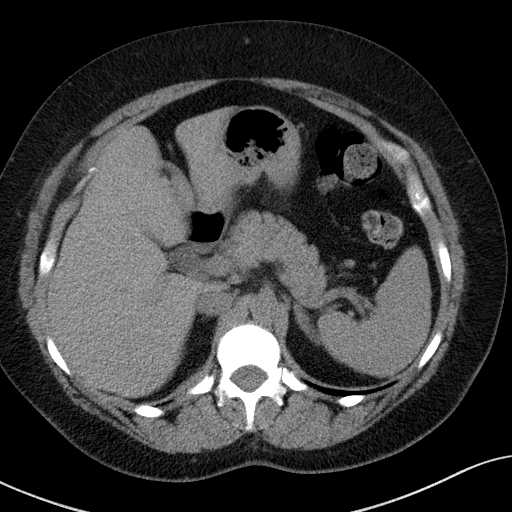
[im 5/61  lung]
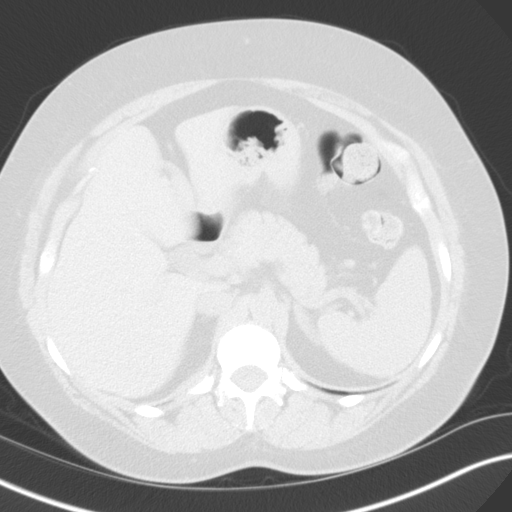
[im 9/61  lung]
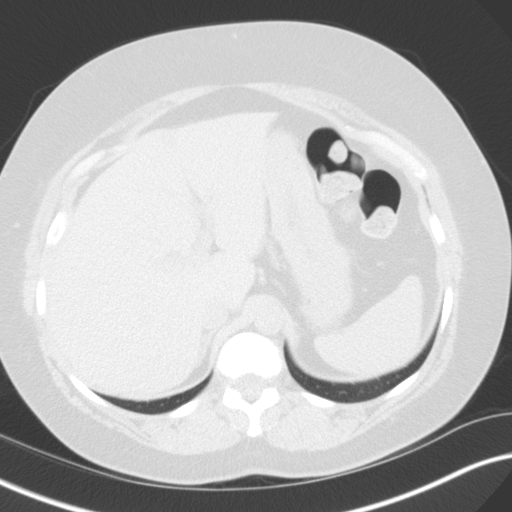
[im 14/61  lung]
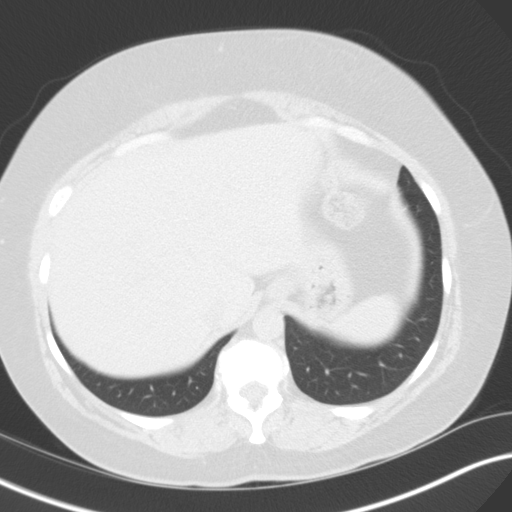
[im 18/61  lung]
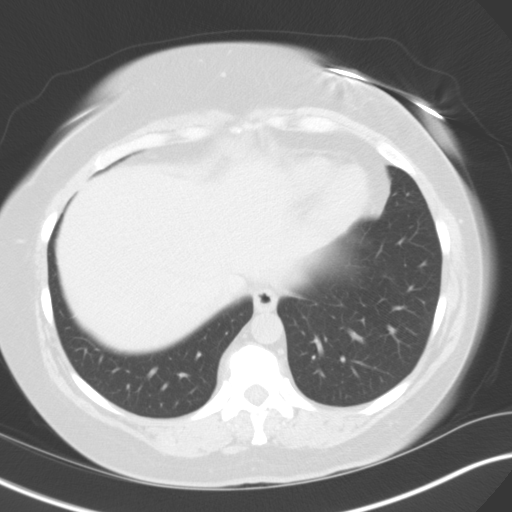
[im 23/61  mediastinal]
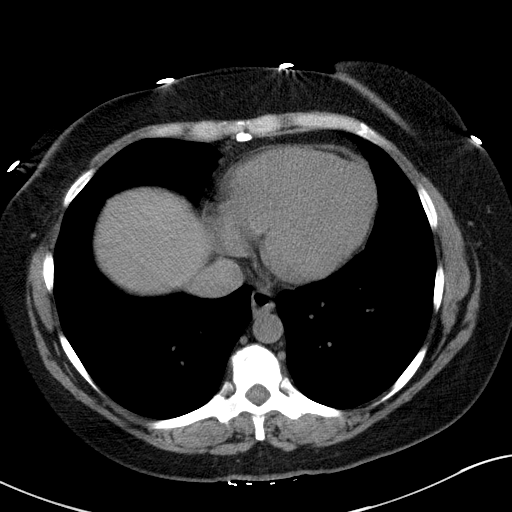
[im 23/61  lung]
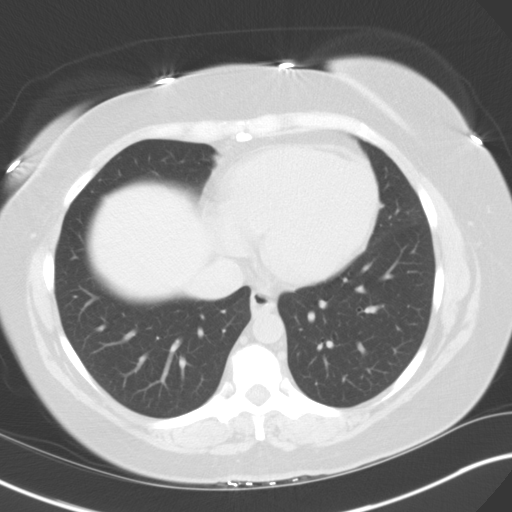
[im 27/61  lung]
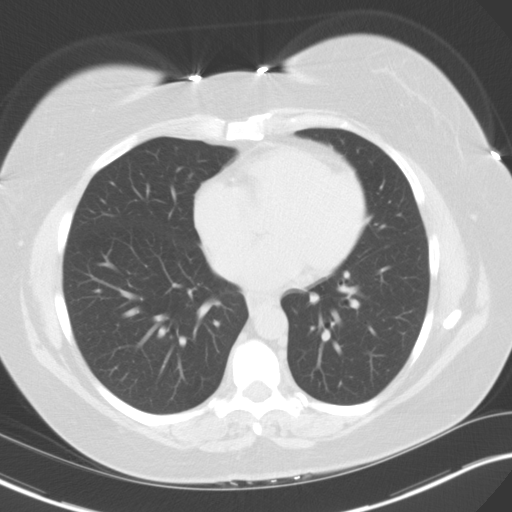
[im 34/61  lung]
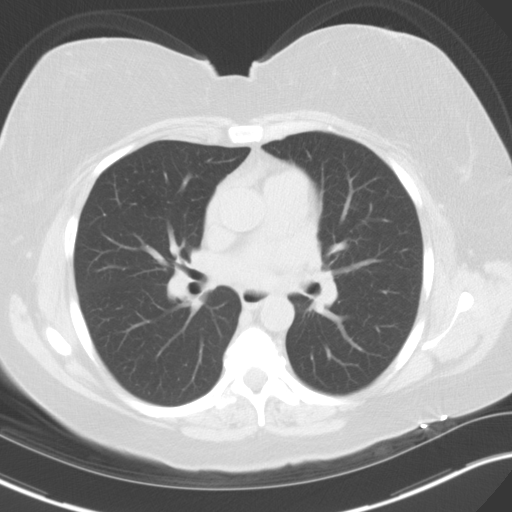
[im 38/61  lung]
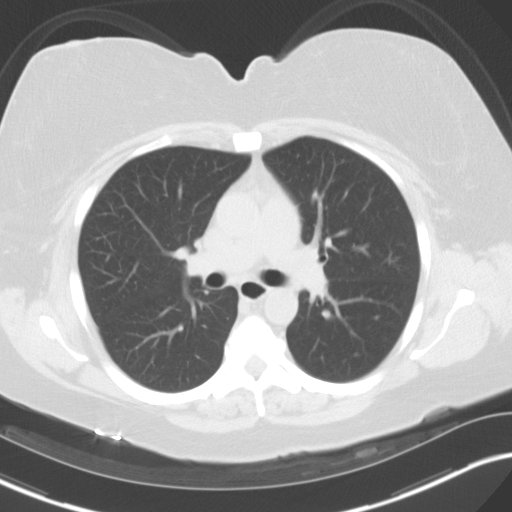
[im 43/61  mediastinal]
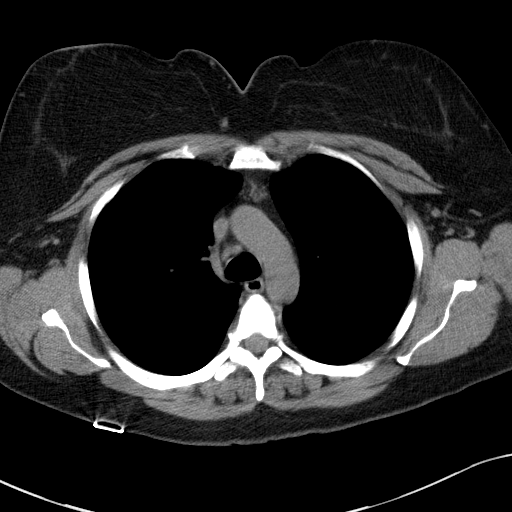
[im 43/61  lung]
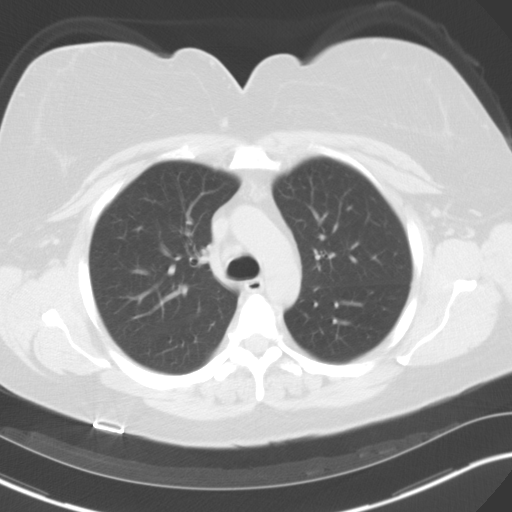
[im 47/61  lung]
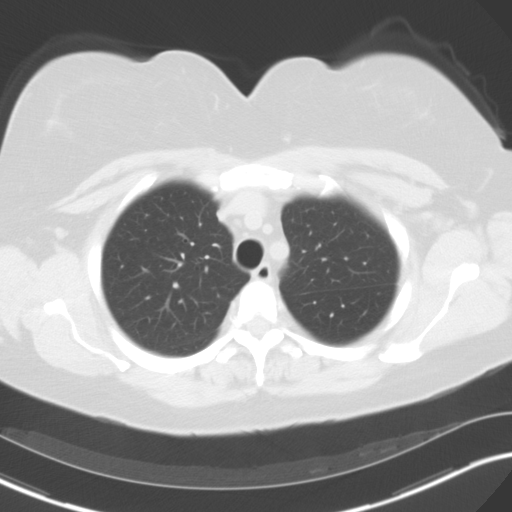
[im 52/61  lung]
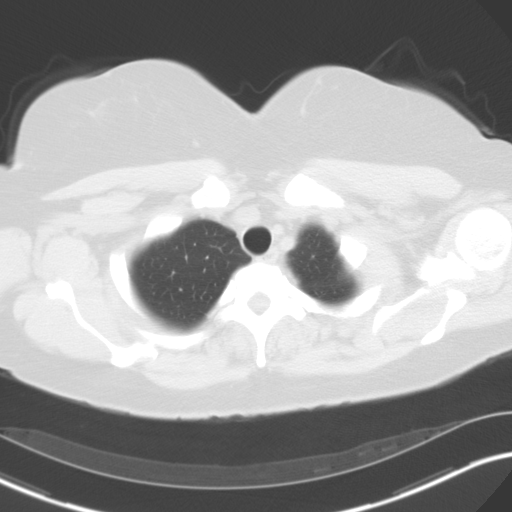
[im 56/61  lung]
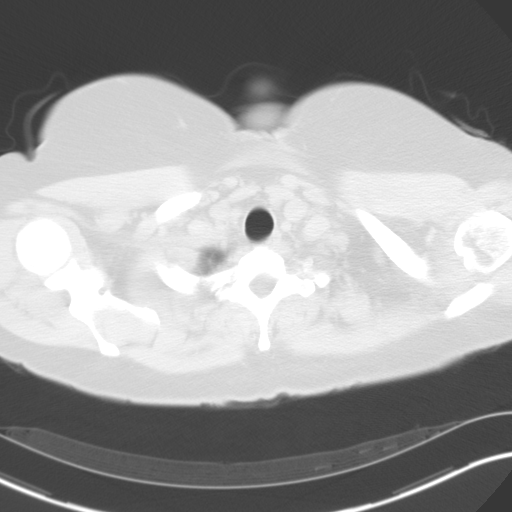

[Series 5: cor routine chest wo · coronal · 0.60mm/px · 3 of 168 slices shown]
[im 34/168  lung]
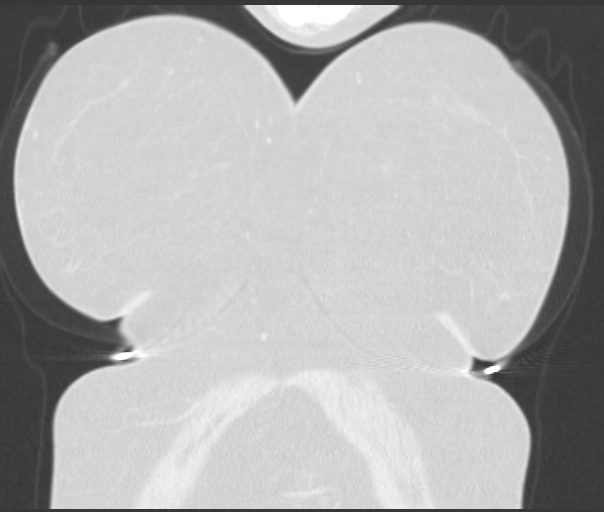
[im 67/168  lung]
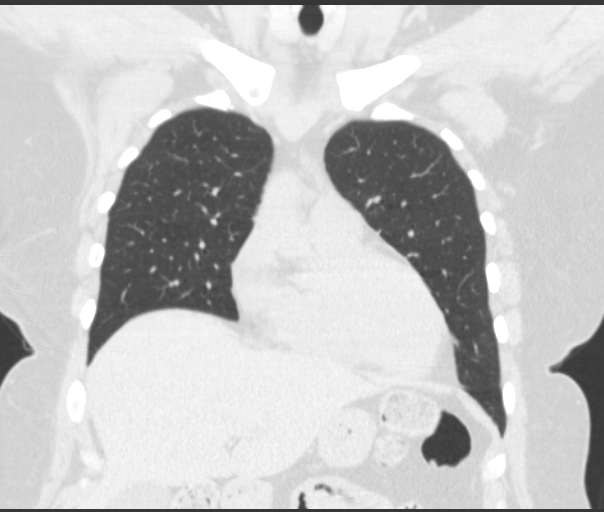
[im 101/168  lung]
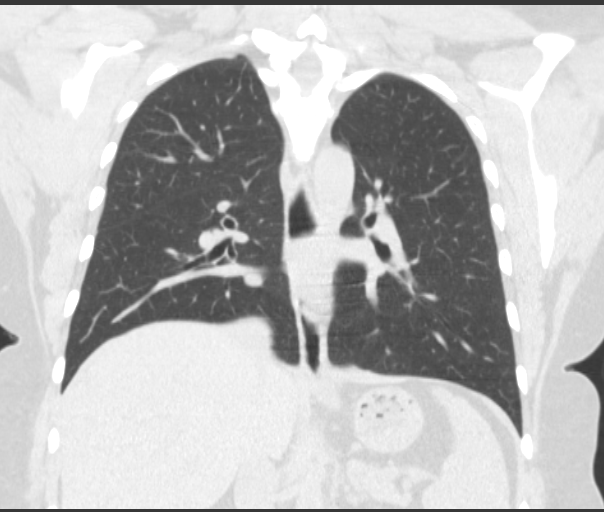

[15 of 36 positions shown; findings below may reference images not displayed]

FINDINGS: The lungs are now on prior early clear. Infiltrates seen on the left
on the previous study have resolved without residual finding. Hilar
and mediastinal lymph nodes are now normal. No pleural or
pericardial fluid. Mild atherosclerotic calcification of the aortic
arch. Scans in the upper abdomen are normal. No significant bone
finding.
IMPRESSION: Chest CT now normal for age. Complete resolution of left-sided
infiltrates and mediastinal nodal prominence.

## 2017-08-08 DIAGNOSIS — Z9889 Other specified postprocedural states: Secondary | ICD-10-CM | POA: Diagnosis not present

## 2017-08-08 DIAGNOSIS — Z8639 Personal history of other endocrine, nutritional and metabolic disease: Secondary | ICD-10-CM | POA: Diagnosis not present

## 2017-08-08 DIAGNOSIS — I10 Essential (primary) hypertension: Secondary | ICD-10-CM | POA: Diagnosis not present

## 2017-08-08 DIAGNOSIS — J449 Chronic obstructive pulmonary disease, unspecified: Secondary | ICD-10-CM | POA: Diagnosis not present

## 2017-08-08 DIAGNOSIS — E538 Deficiency of other specified B group vitamins: Secondary | ICD-10-CM | POA: Diagnosis not present

## 2017-08-08 DIAGNOSIS — G8929 Other chronic pain: Secondary | ICD-10-CM | POA: Diagnosis not present

## 2017-08-08 DIAGNOSIS — Z23 Encounter for immunization: Secondary | ICD-10-CM | POA: Diagnosis not present

## 2017-08-08 DIAGNOSIS — R6889 Other general symptoms and signs: Secondary | ICD-10-CM | POA: Diagnosis not present

## 2017-08-08 DIAGNOSIS — K219 Gastro-esophageal reflux disease without esophagitis: Secondary | ICD-10-CM | POA: Diagnosis not present

## 2017-08-08 DIAGNOSIS — E559 Vitamin D deficiency, unspecified: Secondary | ICD-10-CM | POA: Diagnosis not present

## 2017-08-08 DIAGNOSIS — M25571 Pain in right ankle and joints of right foot: Secondary | ICD-10-CM | POA: Diagnosis not present

## 2017-08-08 DIAGNOSIS — E785 Hyperlipidemia, unspecified: Secondary | ICD-10-CM | POA: Diagnosis not present

## 2017-08-18 ENCOUNTER — Other Ambulatory Visit: Payer: Self-pay | Admitting: Podiatry

## 2017-08-18 ENCOUNTER — Ambulatory Visit (INDEPENDENT_AMBULATORY_CARE_PROVIDER_SITE_OTHER): Payer: Medicare HMO

## 2017-08-18 ENCOUNTER — Ambulatory Visit: Payer: Medicare HMO | Admitting: Podiatry

## 2017-08-18 ENCOUNTER — Ambulatory Visit (INDEPENDENT_AMBULATORY_CARE_PROVIDER_SITE_OTHER): Payer: Medicare HMO | Admitting: Podiatry

## 2017-08-18 VITALS — Temp 99.8°F

## 2017-08-18 DIAGNOSIS — M7661 Achilles tendinitis, right leg: Secondary | ICD-10-CM

## 2017-08-18 DIAGNOSIS — M79671 Pain in right foot: Secondary | ICD-10-CM

## 2017-08-18 MED ORDER — NONFORMULARY OR COMPOUNDED ITEM: 2 refills | Status: DC

## 2017-08-18 MED ORDER — MUPIROCIN CALCIUM 2 % EX CREA: 1.0000 "application " | TOPICAL_CREAM | Freq: Two times a day (BID) | CUTANEOUS | 2 refills | Status: DC

## 2017-08-18 MED ORDER — MELOXICAM 15 MG PO TABS: ORAL_TABLET | ORAL | 1 refills | Status: DC

## 2017-08-18 MED ORDER — NONFORMULARY OR COMPOUNDED ITEM
2 refills | Status: DC
Start: 2017-08-18 — End: 2017-08-18

## 2017-08-21 NOTE — Progress Notes (Signed)
   Subjective:  Patient presents today status post retrocalcaneal exostectomy with repair of Achilles tendon revisional surgery. DOS: 03/09/17. Patient reports a possible infection to the incision site of the right Achilles. She rates her pain at 7/10 and describes it as burning, bruising and pain. She reports associated red drainage and a subjective fever yesterday. Walking increases the pain. She denies alleviating factors. She is here for further evaluation and treatment.    Past Medical History:  Diagnosis Date  . Acid reflux   . Anxiety   . Chronic lower back pain    osteoarthritis in luber spine  . COPD (chronic obstructive pulmonary disease) (HCC)   . Depression    Bipolar  . Dysrhythmia    "skips" beats rx  . Heart murmur   . History of degenerative disc disease   . History of pulmonary embolus (PE)   . HTN (hypertension) 03/20/2015  . Hypertension   . Osteoarthritis of lumbosacral spine   . Peripheral vascular disease (HCC) 2013   had PE after hysterectomy  . Pneumonia    hx  . PSVT (paroxysmal supraventricular tachycardia) (HCC)   . Right Achilles tendinitis   . Thrombus    right lung in 2015, off anticoagulation now  . Transaminitis       Objective/Physical Exam Skin incisions appear to be well coapted. No sign of infectious process noted. No dehiscence. No active bleeding noted. Moderate edema noted to the surgical extremity. Pain on palpation noted to the posterior tubercle of the right calcaneus at the insertion of the Achilles tendon consistent with retrocalcaneal bursitis.  Radiographic Exam:  Orthopedic hardware and osteotomies sites appear to be stable with routine healing.The Achilles tendon appears intact with moderate thickening secondary to postsurgical changes  Assessment: 1. s/p retrocalcaneal exostectomy with repair of Achilles tendon revision surgery. DOS: 03/09/17. 2. Achilles tendinitis right   Plan of Care:  1. Patient was evaluated. X-rays  reviewed. 2. Compression anklet dispensed. 3. Prescription for mupirocin ointment given to patient. 4. Prescription for pain cream to be dispensed from [facility] [place]. 5. Refill prescription for meloxicam 15 mg given to patient. 6. Return to clinic in 4 weeks.   Felecia Shelling, DPM [facility]  Dr. Felecia Shelling, DPM    [address]                                        [place], [place] [address]                Office [phone]  Fax [phone]

## 2017-08-30 ENCOUNTER — Telehealth: Payer: Self-pay | Admitting: Podiatry

## 2017-08-30 NOTE — Telephone Encounter (Signed)
Patient requesting a refill on her pain medication. Percocet 325. Patient says foot is swelling and has a lot of pain.

## 2017-08-30 NOTE — Telephone Encounter (Signed)
Sure, please refill.

## 2017-08-31 MED ORDER — OXYCODONE-ACETAMINOPHEN 5-325 MG PO TABS: 1.0000 | ORAL_TABLET | Freq: Four times a day (QID) | ORAL | 0 refills | Status: DC | PRN

## 2017-08-31 NOTE — Addendum Note (Signed)
Addended by: Geraldine ContrasVENABLE, Kenedie Dirocco D on: 08/31/2017 09:28 AM   Modules accepted: Orders

## 2017-08-31 NOTE — Telephone Encounter (Signed)
rx has been printed and left at front desk for patient to pick up per Dr. Logan BoresEvans

## 2017-09-06 ENCOUNTER — Telehealth: Payer: Self-pay | Admitting: Podiatry

## 2017-09-06 NOTE — Telephone Encounter (Signed)
Patient wanted refill on medication and something for the nerves on her foot

## 2017-09-07 ENCOUNTER — Telehealth: Payer: Self-pay | Admitting: Podiatry

## 2017-09-07 NOTE — Telephone Encounter (Signed)
Is this ok to refill? Please advise. 

## 2017-09-07 NOTE — Telephone Encounter (Signed)
I was calling about some medication. If you could give me a call back please at [phone]. Thank you.

## 2017-09-07 NOTE — Telephone Encounter (Signed)
Yes, okay to refill 

## 2017-09-08 MED ORDER — GABAPENTIN 300 MG PO CAPS: 300.0000 mg | ORAL_CAPSULE | Freq: Two times a day (BID) | ORAL | 1 refills | Status: AC

## 2017-09-08 MED ORDER — OXYCODONE-ACETAMINOPHEN 5-325 MG PO TABS: 1.0000 | ORAL_TABLET | Freq: Four times a day (QID) | ORAL | 0 refills | Status: DC | PRN

## 2017-09-08 NOTE — Telephone Encounter (Signed)
Per Dr. Logan BoresEvans, rx for Oxycodone and Gabapentin has been printed and patient aware to pick up at front desk

## 2017-09-08 NOTE — Addendum Note (Signed)
Addended by: Geraldine ContrasVENABLE, Quame Spratlin D on: 09/08/2017 03:44 PM   Modules accepted: Orders

## 2017-09-12 ENCOUNTER — Other Ambulatory Visit: Payer: Self-pay | Admitting: Family Medicine

## 2017-09-14 ENCOUNTER — Telehealth: Payer: Self-pay | Admitting: Podiatry

## 2017-09-14 NOTE — Telephone Encounter (Signed)
Patient called the office asking for refill on pain meds. Would like to pick up in [place] office.

## 2017-09-14 NOTE — Telephone Encounter (Signed)
Patient called, she needs refill of her pain meds/

## 2017-09-15 ENCOUNTER — Ambulatory Visit: Payer: Medicare HMO | Admitting: Podiatry

## 2017-09-18 MED ORDER — OXYCODONE-ACETAMINOPHEN 5-325 MG PO TABS: 1.0000 | ORAL_TABLET | Freq: Four times a day (QID) | ORAL | 0 refills | Status: DC | PRN

## 2017-09-18 NOTE — Telephone Encounter (Signed)
Per Dr. Evans, rx has been printed and left at front desk for patient to pick up 

## 2017-09-18 NOTE — Telephone Encounter (Signed)
Dr. Evans, is it ok to refill her pain medication?  Please advise 

## 2017-09-18 NOTE — Telephone Encounter (Signed)
Yes

## 2017-09-18 NOTE — Addendum Note (Signed)
Addended by: Geraldine ContrasVENABLE, Kensleigh Gates D on: 09/18/2017 02:55 PM   Modules accepted: Orders

## 2017-09-19 ENCOUNTER — Ambulatory Visit: Payer: Medicare HMO | Admitting: Podiatry

## 2017-09-22 ENCOUNTER — Ambulatory Visit: Payer: Medicare HMO | Admitting: Podiatry

## 2017-09-27 ENCOUNTER — Telehealth: Payer: Self-pay | Admitting: Podiatry

## 2017-09-27 NOTE — Telephone Encounter (Signed)
Is this ok to refill?  

## 2017-09-29 ENCOUNTER — Ambulatory Visit: Payer: Medicare HMO | Admitting: Podiatry

## 2017-09-30 NOTE — Telephone Encounter (Signed)
Okay to refill? 

## 2017-10-03 ENCOUNTER — Encounter: Payer: Self-pay | Admitting: Podiatry

## 2017-10-03 ENCOUNTER — Ambulatory Visit (INDEPENDENT_AMBULATORY_CARE_PROVIDER_SITE_OTHER): Payer: Medicare HMO | Admitting: Podiatry

## 2017-10-03 ENCOUNTER — Ambulatory Visit: Payer: Medicare HMO | Admitting: Podiatry

## 2017-10-03 DIAGNOSIS — L97512 Non-pressure chronic ulcer of other part of right foot with fat layer exposed: Secondary | ICD-10-CM | POA: Diagnosis not present

## 2017-10-03 DIAGNOSIS — M76821 Posterior tibial tendinitis, right leg: Secondary | ICD-10-CM

## 2017-10-03 DIAGNOSIS — I83015 Varicose veins of right lower extremity with ulcer other part of foot: Secondary | ICD-10-CM | POA: Diagnosis not present

## 2017-10-03 DIAGNOSIS — L97519 Non-pressure chronic ulcer of other part of right foot with unspecified severity: Secondary | ICD-10-CM

## 2017-10-03 MED ORDER — OXYCODONE-ACETAMINOPHEN 5-325 MG PO TABS: 1.0000 | ORAL_TABLET | Freq: Four times a day (QID) | ORAL | 0 refills | Status: DC | PRN

## 2017-10-03 MED ORDER — GABAPENTIN 600 MG PO TABS: 600.0000 mg | ORAL_TABLET | Freq: Three times a day (TID) | ORAL | 1 refills | Status: AC

## 2017-10-03 MED ORDER — GENTAMICIN SULFATE 0.1 % EX CREA: 1.0000 "application " | TOPICAL_CREAM | Freq: Three times a day (TID) | CUTANEOUS | 0 refills | Status: AC

## 2017-10-03 NOTE — Telephone Encounter (Signed)
Per Dr. Logan BoresEvans, rx for Oxycodone 5/325 printed and given to patient at office visit

## 2017-10-09 ENCOUNTER — Telehealth: Payer: Self-pay | Admitting: Podiatry

## 2017-10-09 NOTE — Telephone Encounter (Signed)
Okay to refill? 

## 2017-10-09 NOTE — Telephone Encounter (Signed)
Ok to refill 

## 2017-10-10 MED ORDER — OXYCODONE-ACETAMINOPHEN 5-325 MG PO TABS: 1.0000 | ORAL_TABLET | Freq: Four times a day (QID) | ORAL | 0 refills | Status: DC | PRN

## 2017-10-10 NOTE — Telephone Encounter (Signed)
Per Dr. Logan BoresEvans, rx for Percocet has been printed and left at front desk for patient to pick up.   Patient is aware of rx.

## 2017-10-17 ENCOUNTER — Telehealth: Payer: Self-pay | Admitting: Podiatry

## 2017-10-17 MED ORDER — OXYCODONE-ACETAMINOPHEN 5-325 MG PO TABS: 1.0000 | ORAL_TABLET | Freq: Four times a day (QID) | ORAL | 0 refills | Status: DC | PRN

## 2017-10-17 NOTE — Addendum Note (Signed)
Addended by: Geraldine ContrasVENABLE, ANGELA D on: 10/17/2017 01:27 PM   Modules accepted: Orders

## 2017-10-17 NOTE — Telephone Encounter (Signed)
Sure ok for refill

## 2017-10-17 NOTE — Telephone Encounter (Signed)
Patient called asking for refill on her pain meds. Please call when ready,.

## 2017-10-17 NOTE — Telephone Encounter (Signed)
Per Dr. Logan BoresEvans, rx for Oxycodone 5/325 has been printed and left at front desk for patient pick up   Patient has been notified of rx

## 2017-10-24 ENCOUNTER — Ambulatory Visit: Payer: Medicare HMO | Admitting: Podiatry

## 2017-10-25 ENCOUNTER — Telehealth: Payer: Self-pay

## 2017-10-25 ENCOUNTER — Telehealth: Payer: Self-pay | Admitting: Podiatry

## 2017-10-25 MED ORDER — OXYCODONE-ACETAMINOPHEN 5-325 MG PO TABS: 1.0000 | ORAL_TABLET | Freq: Four times a day (QID) | ORAL | 0 refills | Status: DC | PRN

## 2017-10-25 NOTE — Telephone Encounter (Signed)
-----   Message from Felecia ShellingBrent M Evans, DPM sent at 10/25/2017  1:31 PM EST ----- Regarding: Medication refill Okay to refill last pain med.

## 2017-10-25 NOTE — Telephone Encounter (Signed)
Patient is requesting another refill of pain medication.  Please advise if I can refill again

## 2017-10-25 NOTE — Telephone Encounter (Signed)
Per Dr. Logan BoresEvans, ok to refill pain medication.  Rx has been printed and left at front desk for patient to pick up.

## 2017-10-25 NOTE — Telephone Encounter (Signed)
Needs refill on medication

## 2017-10-31 ENCOUNTER — Other Ambulatory Visit: Payer: Self-pay | Admitting: Podiatry

## 2017-10-31 ENCOUNTER — Ambulatory Visit (INDEPENDENT_AMBULATORY_CARE_PROVIDER_SITE_OTHER): Payer: Medicare HMO

## 2017-10-31 ENCOUNTER — Encounter: Payer: Self-pay | Admitting: Podiatry

## 2017-10-31 ENCOUNTER — Ambulatory Visit (INDEPENDENT_AMBULATORY_CARE_PROVIDER_SITE_OTHER): Payer: Medicare HMO | Admitting: Podiatry

## 2017-10-31 DIAGNOSIS — I83015 Varicose veins of right lower extremity with ulcer other part of foot: Secondary | ICD-10-CM | POA: Diagnosis not present

## 2017-10-31 DIAGNOSIS — M659 Synovitis and tenosynovitis, unspecified: Secondary | ICD-10-CM

## 2017-10-31 DIAGNOSIS — M7661 Achilles tendinitis, right leg: Secondary | ICD-10-CM | POA: Diagnosis not present

## 2017-10-31 DIAGNOSIS — M25572 Pain in left ankle and joints of left foot: Secondary | ICD-10-CM | POA: Diagnosis not present

## 2017-10-31 DIAGNOSIS — Z9889 Other specified postprocedural states: Secondary | ICD-10-CM | POA: Diagnosis not present

## 2017-10-31 DIAGNOSIS — L97512 Non-pressure chronic ulcer of other part of right foot with fat layer exposed: Secondary | ICD-10-CM | POA: Diagnosis not present

## 2017-10-31 DIAGNOSIS — L97519 Non-pressure chronic ulcer of other part of right foot with unspecified severity: Secondary | ICD-10-CM | POA: Diagnosis not present

## 2017-10-31 MED ORDER — OXYCODONE-ACETAMINOPHEN 5-325 MG PO TABS: 1.0000 | ORAL_TABLET | Freq: Four times a day (QID) | ORAL | 0 refills | Status: DC | PRN

## 2017-11-02 ENCOUNTER — Telehealth: Payer: Self-pay | Admitting: Podiatry

## 2017-11-02 NOTE — Telephone Encounter (Signed)
Returned call to patient, left voice mail to call back to discuss [employer]

## 2017-11-03 MED ORDER — OXYCODONE-ACETAMINOPHEN 5-325 MG PO TABS: 1.0000 | ORAL_TABLET | Freq: Four times a day (QID) | ORAL | 0 refills | Status: DC | PRN

## 2017-11-03 NOTE — Telephone Encounter (Signed)
Patient has been notified that script is at front desk and Per Dr. Logan BoresEvans, we cant keep giving early refills,  She verbally understood.

## 2017-11-03 NOTE — Telephone Encounter (Signed)
Ok to refill and post date for when medication is due.  Please tell her that we can't keep filling early  Thanks

## 2017-11-03 NOTE — Addendum Note (Signed)
Addended by: Geraldine ContrasVENABLE, Channie Bostick D on: 11/03/2017 12:03 PM   Modules accepted: Orders

## 2017-11-03 NOTE — Telephone Encounter (Signed)
I spoke with patient, she states she is leaving to go out of town today for 2 weeks and is requesting a pain script to be dated for 11/07/17    Please advise if this is ok to give

## 2017-11-04 NOTE — Progress Notes (Signed)
Subjective:  Patient presents today status post retrocalcaneal exostectomy with repair of Achilles tendon revisional surgery. DOS: 03/09/17.  Patient states that the right foot is about the same she still has some swelling intermittently to the right foot.  She believes that the ulceration has improved somewhat since last visit to the right posterior heel.  She is applying the gentamicin cream and Band-Aid daily. Patient has a new complaint today regarding some left ankle pain.  Patient states that for the past few weeks she has noticed a significant amount of crepitus with pain to the left ankle.  She states that when she bends over it makes a grinding sound.  It is painful to walk on and this started approximately 1 week ago.  Patient has tried nothing to alleviate the symptoms and it is aggravated by walking and standing.  She denies any trauma.   Past Medical History:  Diagnosis Date  . Acid reflux   . Anxiety   . Chronic lower back pain    osteoarthritis in luber spine  . COPD (chronic obstructive pulmonary disease) (HCC)   . Depression    Bipolar  . Dysrhythmia    "skips" beats rx  . Heart murmur   . History of degenerative disc disease   . History of pulmonary embolus (PE)   . HTN (hypertension) 03/20/2015  . Hypertension   . Osteoarthritis of lumbosacral spine   . Peripheral vascular disease (HCC) 2013   had PE after hysterectomy  . Pneumonia    hx  . PSVT (paroxysmal supraventricular tachycardia) (HCC)   . Right Achilles tendinitis   . Thrombus    right lung in 2015, off anticoagulation now  . Transaminitis      Social History   Socioeconomic History  . Marital status: Single    Spouse name: Not on file  . Number of children: Not on file  . Years of education: Not on file  . Highest education level: Not on file  Social Needs  . Financial resource strain: Not on file  . Food insecurity - worry: Not on file  . Food insecurity - inability: Not on file  .  Transportation needs - medical: Not on file  . Transportation needs - non-medical: Not on file  Occupational History  . Not on file  Tobacco Use  . Smoking status: Never Smoker  . Smokeless tobacco: Never Used  Substance and Sexual Activity  . Alcohol use: No  . Drug use: No    Comment: Patient denies, but per previous H&P from Psych- cocaine use in the past  . Sexual activity: Yes    Birth control/protection: Surgical  Other Topics Concern  . Not on file  Social History Narrative  . Not on file    Objective/Physical Exam Neurovascular status intact.  Skin incisions appear to be well coapted with the exception of a small area of dehiscence to the surgical incision site posterior heel right lower extremity measuring approximately 0.2 x 0.2 x 0.3 cm.  Wound base appears to be red and granular with a minimal amount of serosanguineous drainage.  Periwound integrity is intact.  No malodor.  There is no exposed bone muscle tendon ligament or joint.. No sign of infectious process noted. No dehiscence. No active bleeding noted.  Minimal edema noted to the surgical extremity. Pain on palpation noted to the posterior tubercle of the right calcaneus at the insertion of the Achilles tendon consistent with retrocalcaneal bursitis.  Musculoskeletal exam: Today there is a  significant amount of pain on palpation to the anterior medial and lateral aspects of the patient's left ankle joint.  There is some audible crepitus with ankle joint motion.  Most significantly noted on dorsiflexion of the ankle joint.  Radiographic Exam:  There does appear to be a suspicious step-off of the talar dome posterior aspect best seen on lateral view left ankle.  Assessment: 1. s/p retrocalcaneal exostectomy with repair of Achilles tendon revision surgery. DOS: 03/09/17. 2.  Posterior tibial tendinitis right lower extremity-resolved 3.  Neuritis right foot-resolved 4.  Ankle synovitis left with possible DJD and  crepitus 5.  Ulcer right posterior heel secondary to venous insufficiency  Plan of Care:  1. Patient was evaluated.  2.  Continue compression anklet 3.  Medically necessary excisional debridement including subcutaneous tissue was performed using a tissue nipper.  Excisional debridement of all the necrotic nonviable tissue down to healthy bleeding viable tissue was performed with post debridement measurement same as pre-. 4.  Injection of 0.5 cc Celestone Soluspan injection of the left ankle joint 5.  Continue gabapentin 600 mg 3 times daily 6.  Continue topical gentamicin cream applied to the ulceration site daily 7.  Return to clinic in 4 weeks   Felecia ShellingBrent M. Arli Bree, DPM [facility]  Dr. Felecia ShellingBrent M. [place] Shellhammer, DPM    [address]                                        [place], [place] [address]                Office [phone]  Fax [phone]

## 2017-11-04 NOTE — Progress Notes (Signed)
Subjective:  Patient presents today status post retrocalcaneal exostectomy with repair of Achilles tendon revisional surgery. DOS: 03/09/17.  Patient states there is improvement to the small ulcer to the posterior aspect of the surgical heel however she still is having some burning sensations and pain.  Anti-inflammatory pain cream was prescribed however the patient states that she could not afford the pain cream so she did not receive it. Patient has a new complaint today regarding pain to the dorsal aspect of the right foot.  This pain is been ongoing for approximately 2-3 weeks now.  Patient states that she dropped a small can of peaches on her foot and she has experienced pain ever since.  She presents today for further treatment and evaluation  Past Medical History:  Diagnosis Date  . Acid reflux   . Anxiety   . Chronic lower back pain    osteoarthritis in luber spine  . COPD (chronic obstructive pulmonary disease) (HCC)   . Depression    Bipolar  . Dysrhythmia    "skips" beats rx  . Heart murmur   . History of degenerative disc disease   . History of pulmonary embolus (PE)   . HTN (hypertension) 03/20/2015  . Hypertension   . Osteoarthritis of lumbosacral spine   . Peripheral vascular disease (HCC) 2013   had PE after hysterectomy  . Pneumonia    hx  . PSVT (paroxysmal supraventricular tachycardia) (HCC)   . Right Achilles tendinitis   . Thrombus    right lung in 2015, off anticoagulation now  . Transaminitis       Objective/Physical Exam Neurovascular status intact.  Skin incisions appear to be well coapted with the exception of a small area of dehiscence to the surgical incision site posterior heel right lower extremity measuring approximately 0.4 x 0.3 x 0.3 cm.  Wound base appears to be red and granular with a minimal amount of serosanguineous drainage.  Periwound integrity is intact.  No malodor.  There is no exposed bone muscle tendon ligament or joint.. No sign of  infectious process noted. No dehiscence. No active bleeding noted.  Minimal edema noted to the surgical extremity. Pain on palpation noted to the posterior tubercle of the right calcaneus at the insertion of the Achilles tendon consistent with retrocalcaneal bursitis.  Musculoskeletal exam: There is some pain on palpation along the posterior tibial tendon of the right lower extremity.  Range of motion to the right lower extremity is within normal limits.  Negative pain on palpation to the dorsum of the right foot at the site of injury where the can of peaches fell on her foot.  Posterior tibial tendon pain right lower extremity likely due to compensation and altered gait.  Radiographic Exam:  Patient declined x-rays today  Assessment: 1. s/p retrocalcaneal exostectomy with repair of Achilles tendon revision surgery. DOS: 03/09/17. 2.  Posterior tibial tendinitis right lower extremity 3.  Neuritis right foot   Plan of Care:  1. Patient was evaluated.  2.  Continue compression anklet 3.  Medically necessary excisional debridement including subcutaneous tissue was performed using a tissue nipper.  Excisional debridement of all the necrotic nonviable tissue down to healthy bleeding viable tissue was performed with post debridement measurement same as pre-. 4.  Injection of 0.5 cc Celestone Soluspan injection of the posterior tibial tendon sheath right lower extremity. 5.  Prescription for gabapentin 600 mg #90 with 3 refills 6.  Prescription for gentamicin cream to be applied to the ulceration site.  7.  Return to clinic in 3 weeks   Felecia ShellingBrent M. Evans, DPM [facility]  Dr. Felecia ShellingBrent M. Evans, DPM    [address]                                        [place], [place] [address]                Office [phone]  Fax [phone]

## 2017-11-15 ENCOUNTER — Telehealth: Payer: Self-pay | Admitting: Podiatry

## 2017-11-15 DIAGNOSIS — R6889 Other general symptoms and signs: Secondary | ICD-10-CM | POA: Diagnosis not present

## 2017-11-15 DIAGNOSIS — F064 Anxiety disorder due to known physiological condition: Secondary | ICD-10-CM | POA: Diagnosis not present

## 2017-11-15 DIAGNOSIS — F331 Major depressive disorder, recurrent, moderate: Secondary | ICD-10-CM | POA: Diagnosis not present

## 2017-11-15 DIAGNOSIS — Z79899 Other long term (current) drug therapy: Secondary | ICD-10-CM | POA: Diagnosis not present

## 2017-11-15 DIAGNOSIS — F410 Panic disorder [episodic paroxysmal anxiety] without agoraphobia: Secondary | ICD-10-CM | POA: Diagnosis not present

## 2017-11-15 DIAGNOSIS — F4310 Post-traumatic stress disorder, unspecified: Secondary | ICD-10-CM | POA: Diagnosis not present

## 2017-11-15 MED ORDER — OXYCODONE-ACETAMINOPHEN 5-325 MG PO TABS: 1.0000 | ORAL_TABLET | Freq: Four times a day (QID) | ORAL | 0 refills | Status: DC | PRN

## 2017-11-15 NOTE — Telephone Encounter (Signed)
Okay to refill. - Dr. Sanuel Ladnier

## 2017-11-15 NOTE — Telephone Encounter (Signed)
Do you want to refill her pain medication again?  Please advise

## 2017-11-15 NOTE — Telephone Encounter (Signed)
Per Dr. Logan BoresEvans, rx for Oxycodone printed and left at front desk for patient pick up

## 2017-11-17 DIAGNOSIS — R7303 Prediabetes: Secondary | ICD-10-CM | POA: Diagnosis not present

## 2017-11-17 DIAGNOSIS — Z114 Encounter for screening for human immunodeficiency virus [HIV]: Secondary | ICD-10-CM | POA: Diagnosis not present

## 2017-11-17 DIAGNOSIS — M25571 Pain in right ankle and joints of right foot: Secondary | ICD-10-CM | POA: Diagnosis not present

## 2017-11-17 DIAGNOSIS — Z9889 Other specified postprocedural states: Secondary | ICD-10-CM | POA: Diagnosis not present

## 2017-11-17 DIAGNOSIS — E559 Vitamin D deficiency, unspecified: Secondary | ICD-10-CM | POA: Diagnosis not present

## 2017-11-17 DIAGNOSIS — E785 Hyperlipidemia, unspecified: Secondary | ICD-10-CM | POA: Diagnosis not present

## 2017-11-17 DIAGNOSIS — E538 Deficiency of other specified B group vitamins: Secondary | ICD-10-CM | POA: Diagnosis not present

## 2017-11-17 DIAGNOSIS — K219 Gastro-esophageal reflux disease without esophagitis: Secondary | ICD-10-CM | POA: Diagnosis not present

## 2017-11-17 DIAGNOSIS — M5136 Other intervertebral disc degeneration, lumbar region: Secondary | ICD-10-CM | POA: Diagnosis not present

## 2017-11-17 DIAGNOSIS — R6889 Other general symptoms and signs: Secondary | ICD-10-CM | POA: Diagnosis not present

## 2017-11-17 DIAGNOSIS — J449 Chronic obstructive pulmonary disease, unspecified: Secondary | ICD-10-CM | POA: Diagnosis not present

## 2017-11-17 DIAGNOSIS — I10 Essential (primary) hypertension: Secondary | ICD-10-CM | POA: Diagnosis not present

## 2017-11-21 ENCOUNTER — Telehealth: Payer: Self-pay | Admitting: Podiatry

## 2017-11-21 NOTE — Telephone Encounter (Signed)
Needs appt before any more refills

## 2017-11-28 ENCOUNTER — Encounter: Payer: Self-pay | Admitting: Podiatry

## 2017-11-28 ENCOUNTER — Ambulatory Visit (INDEPENDENT_AMBULATORY_CARE_PROVIDER_SITE_OTHER): Payer: Medicare HMO | Admitting: Podiatry

## 2017-11-28 ENCOUNTER — Telehealth: Payer: Self-pay | Admitting: Podiatry

## 2017-11-28 DIAGNOSIS — L97512 Non-pressure chronic ulcer of other part of right foot with fat layer exposed: Secondary | ICD-10-CM | POA: Diagnosis not present

## 2017-11-28 DIAGNOSIS — M19072 Primary osteoarthritis, left ankle and foot: Secondary | ICD-10-CM

## 2017-11-28 DIAGNOSIS — R6889 Other general symptoms and signs: Secondary | ICD-10-CM | POA: Diagnosis not present

## 2017-11-28 MED ORDER — OXYCODONE-ACETAMINOPHEN 10-325 MG PO TABS: 1.0000 | ORAL_TABLET | Freq: Four times a day (QID) | ORAL | 0 refills | Status: DC | PRN

## 2017-11-30 NOTE — Telephone Encounter (Signed)
-----   Message from Felecia ShellingBrent M Evans, DPM sent at 11/28/2017 12:05 PM EST ----- Regarding: MRI left ankle Please order MRI left ankle with or without contrast  Dx: severe DJD with crepitus left ankle  Thanks, Dr. Logan BoresEvans

## 2017-11-30 NOTE — Telephone Encounter (Signed)
Per Rebecca Ramos, MRI has been approved from 11/30/17 to 12/30/17 Auth # [id number]  Patient has been notified of approval and will call to schedule her own appt.

## 2017-12-01 LAB — WOUND CULTURE: Organism ID, Bacteria: NONE SEEN

## 2017-12-04 ENCOUNTER — Encounter (INDEPENDENT_AMBULATORY_CARE_PROVIDER_SITE_OTHER): Payer: Medicare HMO | Admitting: Vascular Surgery

## 2017-12-04 ENCOUNTER — Telehealth: Payer: Self-pay | Admitting: Podiatry

## 2017-12-05 MED ORDER — OXYCODONE-ACETAMINOPHEN 10-325 MG PO TABS: 1.0000 | ORAL_TABLET | Freq: Four times a day (QID) | ORAL | 0 refills | Status: DC | PRN

## 2017-12-05 NOTE — Progress Notes (Signed)
Subjective:  Patient presents today status post retrocalcaneal exostectomy with repair of Achilles tendon revisional surgery. DOS: 03/09/17.  Patient continues to complain of a small draining sinus to the surgical incision site right posterior heel.  Patient states overall she denies any significant pain. Patient also complains of continued pain and tenderness regarding her ankle to the left lower extremity.  She she demonstrates that with dorsiflexion of the foot onto the leg there is a very audible crepitus with pain to the left ankle.  Last visit on 10/31/2017 steroid injection was placed to the left ankle joint.  She states that she did get some relief however the pain recurred.  She presents today for further treatment and evaluation.  Past Medical History:  Diagnosis Date  . Acid reflux   . Anxiety   . Chronic lower back pain    osteoarthritis in luber spine  . COPD (chronic obstructive pulmonary disease) (HCC)   . Depression    Bipolar  . Dysrhythmia    "skips" beats rx  . Heart murmur   . History of degenerative disc disease   . History of pulmonary embolus (PE)   . HTN (hypertension) 03/20/2015  . Hypertension   . Osteoarthritis of lumbosacral spine   . Peripheral vascular disease (HCC) 2013   had PE after hysterectomy  . Pneumonia    hx  . PSVT (paroxysmal supraventricular tachycardia) (HCC)   . Right Achilles tendinitis   . Thrombus    right lung in 2015, off anticoagulation now  . Transaminitis      Social History   Socioeconomic History  . Marital status: Single    Spouse name: Not on file  . Number of children: Not on file  . Years of education: Not on file  . Highest education level: Not on file  Social Needs  . Financial resource strain: Not on file  . Food insecurity - worry: Not on file  . Food insecurity - inability: Not on file  . Transportation needs - medical: Not on file  . Transportation needs - non-medical: Not on file  Occupational History  .  Not on file  Tobacco Use  . Smoking status: Never Smoker  . Smokeless tobacco: Never Used  Substance and Sexual Activity  . Alcohol use: No  . Drug use: No    Comment: Patient denies, but per previous H&P from Psych- cocaine use in the past  . Sexual activity: Yes    Birth control/protection: Surgical  Other Topics Concern  . Not on file  Social History Narrative  . Not on file    Objective/Physical Exam Neurovascular status intact.  Skin incisions appear to be well coapted with the exception of a small area of a draining sinus to the surgical incision site posterior heel right lower extremity measuring approximately 0.2 x 0.2 x 0.2 cm.  Wound base appears to be red and granular with a minimal amount of serosanguineous drainage.  Periwound integrity is intact.  No malodor.  There is no exposed bone muscle tendon ligament or joint.. No sign of acute inflammatory changes or infection.  No active bleeding noted.  Minimal edema noted to the surgical extremity. Pain on palpation noted to the posterior tubercle of the right calcaneus at the insertion of the Achilles tendon consistent with retrocalcaneal bursitis.  Musculoskeletal exam: Today there is a significant amount of pain on palpation to the anterior medial and lateral aspects of the patient's left ankle joint.  There is some audible crepitus  with ankle joint motion.  Most significantly noted on dorsiflexion of the ankle joint.  Radiographic Exam:  There does appear to be a suspicious step-off of the talar dome posterior aspect best seen on lateral view left ankle.  Assessment: 1. s/p retrocalcaneal exostectomy with repair of Achilles tendon revision surgery. DOS: 03/09/17. 2.  Posterior tibial tendinitis right lower extremity-resolved 3.  Neuritis right foot-resolved 4.  Ankle synovitis left with possible DJD and crepitus 5.  Small draining sinus right posterior heel  Plan of Care:  1. Patient was evaluated.  2.  Today cultures were  taken to the right posterior heel draining sinus after excisional debridement was performed.  Excisional debridement of all the necrotic nonviable tissue down to healthy bleeding viable tissue was performed with post debridement measurement same as pre-. 3.  Today we are going to place an order for an MRI of left ankle due to severe pain with crepitus during range of motion but weightbearing. 4.  The patient does have an appointment with [facility] pain management on December 15, 2017 5.  Refill prescription for Percocet 5/325 mg 6.  Return to clinic in 3 weeks to review MRI results  Felecia ShellingBrent M. Olaoluwa Grieder, DPM [facility]  Dr. Felecia ShellingBrent M. Aliayah Tyer, DPM    [address]                                        [place], [place] [address]                Office [phone]  Fax [phone]

## 2017-12-05 NOTE — Telephone Encounter (Signed)
Ok to refill until pain clinic appt.

## 2017-12-05 NOTE — Telephone Encounter (Signed)
Per Dr. Logan BoresEvans, rx for Oxycodone has been printed and left at front desk for patient pick up.

## 2017-12-11 ENCOUNTER — Other Ambulatory Visit: Payer: Self-pay | Admitting: Podiatry

## 2017-12-11 NOTE — Telephone Encounter (Signed)
Needs pain med refilled

## 2017-12-12 MED ORDER — OXYCODONE-ACETAMINOPHEN 10-325 MG PO TABS: 1.0000 | ORAL_TABLET | Freq: Four times a day (QID) | ORAL | 0 refills | Status: DC | PRN

## 2017-12-12 MED ORDER — CIPROFLOXACIN HCL 500 MG PO TABS: 500.0000 mg | ORAL_TABLET | Freq: Two times a day (BID) | ORAL | 0 refills | Status: DC

## 2017-12-12 NOTE — Telephone Encounter (Signed)
I spoke with patient regarding her lab results.  She was informed to start on Cipro for infection Per. Dr.Evans.   Also per Dr.Evans, ok to refill pain medicaiton.   Both scripts have been printed and left at front desk for patient pick up

## 2017-12-12 NOTE — Telephone Encounter (Signed)
Angie, please discontinue Levaquin due to allergy. Please prescribe Cipro 500mg  BID #28 w/ 1 refill.  Thanks, Dr. Logan BoresEvans

## 2017-12-12 NOTE — Addendum Note (Signed)
Addended by: Geraldine ContrasVENABLE, Rockelle Heuerman D on: 12/12/2017 01:29 PM   Modules accepted: Orders

## 2017-12-18 ENCOUNTER — Encounter (INDEPENDENT_AMBULATORY_CARE_PROVIDER_SITE_OTHER): Payer: Medicare HMO | Admitting: Vascular Surgery

## 2017-12-18 ENCOUNTER — Telehealth: Payer: Self-pay | Admitting: Podiatry

## 2017-12-19 ENCOUNTER — Ambulatory Visit (INDEPENDENT_AMBULATORY_CARE_PROVIDER_SITE_OTHER): Payer: Medicare HMO | Admitting: Podiatry

## 2017-12-19 ENCOUNTER — Encounter: Payer: Self-pay | Admitting: Podiatry

## 2017-12-19 DIAGNOSIS — L97512 Non-pressure chronic ulcer of other part of right foot with fat layer exposed: Secondary | ICD-10-CM | POA: Diagnosis not present

## 2017-12-19 DIAGNOSIS — L97519 Non-pressure chronic ulcer of other part of right foot with unspecified severity: Secondary | ICD-10-CM

## 2017-12-19 DIAGNOSIS — M19072 Primary osteoarthritis, left ankle and foot: Secondary | ICD-10-CM | POA: Diagnosis not present

## 2017-12-19 DIAGNOSIS — M7661 Achilles tendinitis, right leg: Secondary | ICD-10-CM | POA: Diagnosis not present

## 2017-12-19 DIAGNOSIS — I83015 Varicose veins of right lower extremity with ulcer other part of foot: Secondary | ICD-10-CM | POA: Diagnosis not present

## 2017-12-19 DIAGNOSIS — M659 Synovitis and tenosynovitis, unspecified: Secondary | ICD-10-CM | POA: Diagnosis not present

## 2017-12-19 DIAGNOSIS — R6889 Other general symptoms and signs: Secondary | ICD-10-CM | POA: Diagnosis not present

## 2017-12-19 MED ORDER — CIPROFLOXACIN HCL 500 MG PO TABS: 500.0000 mg | ORAL_TABLET | Freq: Two times a day (BID) | ORAL | 0 refills | Status: AC

## 2017-12-19 MED ORDER — OXYCODONE-ACETAMINOPHEN 10-325 MG PO TABS: 1.0000 | ORAL_TABLET | Freq: Four times a day (QID) | ORAL | 0 refills | Status: DC | PRN

## 2017-12-19 MED ORDER — MUPIROCIN 2 % EX OINT: 1.0000 "application " | TOPICAL_OINTMENT | Freq: Two times a day (BID) | CUTANEOUS | 0 refills | Status: AC

## 2017-12-19 MED ORDER — CIPROFLOXACIN HCL 500 MG PO TABS: 500.0000 mg | ORAL_TABLET | Freq: Two times a day (BID) | ORAL | 0 refills | Status: DC

## 2017-12-19 NOTE — Telephone Encounter (Signed)
Patient was seen in office today and given rx for pain medication

## 2017-12-20 ENCOUNTER — Encounter (INDEPENDENT_AMBULATORY_CARE_PROVIDER_SITE_OTHER): Payer: Self-pay | Admitting: Vascular Surgery

## 2017-12-25 ENCOUNTER — Telehealth: Payer: Self-pay | Admitting: Podiatry

## 2017-12-25 ENCOUNTER — Telehealth: Payer: Self-pay

## 2017-12-25 ENCOUNTER — Ambulatory Visit
Admission: RE | Admit: 2017-12-25 | Discharge: 2017-12-25 | Disposition: A | Discharge status: Home / Self Care | Payer: Medicare HMO | Source: Ambulatory Visit | Attending: Podiatry | Admitting: Podiatry

## 2017-12-25 ENCOUNTER — Telehealth: Payer: Self-pay | Admitting: *Deleted

## 2017-12-25 DIAGNOSIS — M25872 Other specified joint disorders, left ankle and foot: Secondary | ICD-10-CM | POA: Insufficient documentation

## 2017-12-25 DIAGNOSIS — M19072 Primary osteoarthritis, left ankle and foot: Secondary | ICD-10-CM | POA: Insufficient documentation

## 2017-12-25 DIAGNOSIS — M7752 Other enthesopathy of left foot: Secondary | ICD-10-CM | POA: Insufficient documentation

## 2017-12-25 DIAGNOSIS — M7662 Achilles tendinitis, left leg: Secondary | ICD-10-CM | POA: Diagnosis not present

## 2017-12-25 MED ORDER — OXYCODONE-ACETAMINOPHEN 10-325 MG PO TABS: 1.0000 | ORAL_TABLET | Freq: Four times a day (QID) | ORAL | 0 refills | Status: DC | PRN

## 2017-12-25 NOTE — Telephone Encounter (Signed)
Pt presents to office for refill of the pain medication. Dr. Logan BoresEvans states refill as previously oxycodone.

## 2017-12-25 NOTE — Telephone Encounter (Signed)
Called patient to inform her that we will refill her pain medication, but she would have to pick it up today at the [place] office. If she did not want to go to the [place] office then she could wait until tomorrow and [facility]

## 2017-12-25 NOTE — Telephone Encounter (Signed)
Pt called in for refill on her pain meds

## 2017-12-27 DIAGNOSIS — R6889 Other general symptoms and signs: Secondary | ICD-10-CM | POA: Diagnosis not present

## 2017-12-27 DIAGNOSIS — G894 Chronic pain syndrome: Secondary | ICD-10-CM | POA: Diagnosis not present

## 2017-12-27 DIAGNOSIS — M47816 Spondylosis without myelopathy or radiculopathy, lumbar region: Secondary | ICD-10-CM | POA: Diagnosis not present

## 2017-12-27 DIAGNOSIS — Z9889 Other specified postprocedural states: Secondary | ICD-10-CM | POA: Diagnosis not present

## 2017-12-28 ENCOUNTER — Ambulatory Visit (INDEPENDENT_AMBULATORY_CARE_PROVIDER_SITE_OTHER): Payer: Medicare HMO | Admitting: Vascular Surgery

## 2017-12-28 ENCOUNTER — Encounter (INDEPENDENT_AMBULATORY_CARE_PROVIDER_SITE_OTHER): Payer: Self-pay | Admitting: Vascular Surgery

## 2017-12-28 VITALS — BP 121/88 | HR 63 | Resp 16 | Ht 62.0 in | Wt 242.0 lb

## 2017-12-28 DIAGNOSIS — Z716 Tobacco abuse counseling: Secondary | ICD-10-CM | POA: Diagnosis not present

## 2017-12-28 DIAGNOSIS — E782 Mixed hyperlipidemia: Secondary | ICD-10-CM

## 2017-12-28 DIAGNOSIS — R6889 Other general symptoms and signs: Secondary | ICD-10-CM | POA: Diagnosis not present

## 2017-12-28 DIAGNOSIS — F1420 Cocaine dependence, uncomplicated: Secondary | ICD-10-CM | POA: Diagnosis not present

## 2017-12-28 DIAGNOSIS — G894 Chronic pain syndrome: Secondary | ICD-10-CM

## 2017-12-28 DIAGNOSIS — L89619 Pressure ulcer of right heel, unspecified stage: Secondary | ICD-10-CM

## 2017-12-28 MED ORDER — SILVER SULFADIAZINE 1 % EX CREA: 1.0000 "application " | TOPICAL_CREAM | Freq: Every day | CUTANEOUS | 0 refills | Status: DC

## 2017-12-28 MED ORDER — SILVER SULFADIAZINE 1 % EX CREA: 1.0000 "application " | TOPICAL_CREAM | Freq: Every day | CUTANEOUS | 0 refills | Status: AC

## 2017-12-29 ENCOUNTER — Encounter (INDEPENDENT_AMBULATORY_CARE_PROVIDER_SITE_OTHER): Payer: Self-pay | Admitting: Vascular Surgery

## 2017-12-29 DIAGNOSIS — L89619 Pressure ulcer of right heel, unspecified stage: Secondary | ICD-10-CM | POA: Insufficient documentation

## 2017-12-29 NOTE — Progress Notes (Signed)
Subjective:    Patient ID: Rebecca Ramos, female    DOB: [birth year], 57 y.o.   MRN: [id number] Chief Complaint  Patient presents with  . New Patient (Initial Visit)    Ulcer   Presents as a new patient referred by Dr. Alda PonderVicki Fowler for evaluation of a slow healing right heel ulceration.  The patient endorses a history of undergoing an "Achilles surgery and "for a "bone spur".  The original surgery took place in March of 2018.  The patient was doing well postoperatively however fell and had to undergo additional surgery to "reconstruct her Achilles tendon" due to the trauma of the fall.  This took place on February 08, 2017.  The patient and endorses almost a year of slow healing to the right heel incision.  The patient states that she has undergone multiple rounds of oral antibiotics.  The most recent was Levaquin approximately 2 weeks ago.  She notes her heel ulceration drains a "clear fluid".  The patient also experiences pain to the area requiring the use of narcotic pain medication which is prescribed to her by her podiatrist.  The patient denies any claudication-like symptoms, rest pain or new/worsening ulceration to the bilateral lower extremity.  The patient denies any DVT history to the bilateral lower extremity.  The patient denies any fever, nausea or vomiting.   Review of Systems  Constitutional: Negative.   HENT: Negative.   Eyes: Negative.   Respiratory: Negative.   Cardiovascular: Negative.   Gastrointestinal: Negative.   Endocrine: Negative.   Genitourinary: Negative.   Musculoskeletal: Negative.   Skin: Positive for wound.  Allergic/Immunologic: Negative.   Neurological: Negative.   Hematological: Negative.   Psychiatric/Behavioral: Negative.       Objective:   Physical Exam  Constitutional: She is oriented to person, place, and time. She appears well-developed and well-nourished. No distress.  HENT:  Head: Normocephalic and atraumatic.  Eyes: Conjunctivae are normal.  Pupils are equal, round, and reactive to light.  Neck: Normal range of motion.  Cardiovascular: Normal rate, regular rhythm, normal heart sounds and intact distal pulses.  Pulses:      Radial pulses are 2+ on the right side, and 2+ on the left side.       Dorsalis pedis pulses are 2+ on the right side, and 2+ on the left side.       Posterior tibial pulses are 2+ on the right side, and 2+ on the left side.  Bilateral feet are warm  Pulmonary/Chest: Effort normal and breath sounds normal.  Musculoskeletal: Normal range of motion. She exhibits no edema (No edema is noted to the bilateral lower extremity).  Neurological: She is alert and oriented to person, place, and time.  Skin: She is not diaphoretic.  Right heel: There is a small 2 cm x 2 cm ulceration limited to the breakdown of skin.  There is a clear drainage from the wound.  There is no surrounding erythema or induration.  There is no cellulitis to the right lower extremity.  Psychiatric: She has a normal mood and affect. Her behavior is normal. Judgment and thought content normal.  Vitals reviewed.  BP 121/88 (BP Location: Right Arm, Patient Position: Sitting)   Pulse 63   Resp 16   Ht 5\' 2"  (1.575 m)   Wt 242 lb (109.8 kg)   BMI 44.26 kg/m   Past Medical History:  Diagnosis Date  . Acid reflux   . Anxiety   . Chronic lower back pain  osteoarthritis in luber spine  . COPD (chronic obstructive pulmonary disease) (HCC)   . Depression    Bipolar  . Dysrhythmia    "skips" beats rx  . Heart murmur   . History of degenerative disc disease   . History of pulmonary embolus (PE)   . HTN (hypertension) 03/20/2015  . Hypertension   . Osteoarthritis of lumbosacral spine   . Peripheral vascular disease (HCC) 2013   had PE after hysterectomy  . Pneumonia    hx  . PSVT (paroxysmal supraventricular tachycardia) (HCC)   . Right Achilles tendinitis   . Thrombus    right lung in 2015, off anticoagulation now  . Transaminitis     Social History   Socioeconomic History  . Marital status: Single    Spouse name: Not on file  . Number of children: Not on file  . Years of education: Not on file  . Highest education level: Not on file  Social Needs  . Financial resource strain: Not on file  . Food insecurity - worry: Not on file  . Food insecurity - inability: Not on file  . Transportation needs - medical: Not on file  . Transportation needs - non-medical: Not on file  Occupational History  . Not on file  Tobacco Use  . Smoking status: Never Smoker  . Smokeless tobacco: Never Used  Substance and Sexual Activity  . Alcohol use: No  . Drug use: No    Comment: Patient denies, but per previous H&P from Psych- cocaine use in the past  . Sexual activity: Yes    Birth control/protection: Surgical  Other Topics Concern  . Not on file  Social History Narrative  . Not on file   Past Surgical History:  Procedure Laterality Date  . ABDOMINAL HYSTERECTOMY    . ACHILLES TENDON SURGERY Right 02/09/2017   Procedure: ACHILLES TENDON REPAIR;  Surgeon: Felecia Shelling, DPM;  Location: [facility] OR;  Service: Podiatry;  Laterality: Right;  . ANKLE ARTHROSCOPY Right 02/09/2017   Procedure: ANKLE ARTHROSCOPY RIGHT FOOT;  Surgeon: Felecia Shelling, DPM;  Location: [facility] OR;  Service: Podiatry;  Laterality: Right;  . CALCANEAL OSTEOTOMY Right 02/09/2017   Procedure: RETRO CALCANEOUS EXOSTECTOMY;  Surgeon: Felecia Shelling, DPM;  Location: [facility] OR;  Service: Podiatry;  Laterality: Right;  . CESAREAN SECTION Bilateral   . CESAREAN SECTION Bilateral   . ELBOW SURGERY     bilateral  . thumb surgery     right   . TONSILLECTOMY     Family History  Problem Relation Age of Onset  . CAD Mother   . Heart disease Mother        Pacemaker  . CAD Sister   . Heart disease Sister        Heart surgery x 1  . Throat cancer Brother   . CAD Maternal Grandmother   . CAD Maternal Grandfather    Allergies  Allergen Reactions  . Honey Bee Venom  Anaphylaxis  . Sulfacetamide Sodium Hives and Swelling  . Sulfamethoxazole-Trimethoprim Hives  . Sulfasalazine Hives and Swelling  . Levofloxacin Nausea Only and Rash  . Sulfa Antibiotics Hives and Swelling  . Codeine Rash      Assessment & Plan:  Presents as a new patient referred by Dr. Alda Ponder for evaluation of a slow healing right heel ulceration.  The patient endorses a history of undergoing an "Achilles surgery and "for a "bone spur".  The original surgery took place in March of 2018.  The patient was doing well postoperatively however fell and had to undergo additional surgery to "reconstruct her Achilles tendon" due to the trauma of the fall.  This took place on February 08, 2017.  The patient and endorses almost a year of slow healing to the right heel incision.  The patient states that she has undergone multiple rounds of oral antibiotics.  The most recent was Levaquin approximately 2 weeks ago.  She notes her heel ulceration drains a "clear fluid".  The patient also experiences pain to the area requiring the use of narcotic pain medication which is prescribed to her by her podiatrist.  The patient denies any claudication-like symptoms, rest pain or new/worsening ulceration to the bilateral lower extremity.  The patient denies any DVT history to the bilateral lower extremity.  The patient denies any fever, nausea or vomiting.  1. Pressure injury of skin of right heel, unspecified injury stage - New Patient presents with almost a year of slow wound healing to an incision made in March 2018 for an Achilles tendon surgery. The patient has palpable pulses on exam and I have a low threshold to believe any peripheral artery disease is a contributing factor to her slow wound healing however I will bring the patient back to undergo bilateral ABI and right lower extremity arterial duplex. I prescribed the patient Silvadene 1% cream applied to the area daily  - VAS [place] ABI WITH/WO TBI; Future -  VAS [place] LOWER EXTREMITY ARTERIAL DUPLEX; Future  2. Tobacco abuse counseling - Stable We had a discussion for approximately five minutes regarding the absolute need for smoking cessation due to the deleterious nature of tobacco on the vascular system. We discussed the tobacco use would diminish patency of any intervention, and likely significantly worsen progressio of disease. We discussed multiple agents for quitting including replacement therapy or medications to reduce cravings such as Chantix. The patient voices their understanding of the importance of smoking cessation.  3. Cocaine use disorder, moderate, dependence (HCC) - Stable This is a huge contributing factor to her peripheral and coronary artery disease. This would also impede wound healing Discussed with the patient regarding the absolute need for cessation due to the deleterious nature of tobacco on the vascular system.  4. Mixed hyperlipidemia - Stable Encouraged good control as its slows the progression of atherosclerotic disease  5. Chronic pain syndrome - Stable The patient has now been on narcotic pain medication for the discomfort to her right foot for almost a year Recommend the patient seek a pain clinic evaluation however at this time her podiatrist is prescribing her narcotics for her chronic pain needs  Current Outpatient Medications on File Prior to Visit  Medication Sig Dispense Refill  . ALPRAZolam (XANAX) 1 MG tablet Take 1 mg by mouth 4 (four) times daily as needed for anxiety.     . Blood Pressure Monitoring (BLOOD PRESSURE CUFF) MISC 1 each by Does not apply route daily. 1 each 0  . cyanocobalamin (,VITAMIN B-12,) 1000 MCG/ML injection Inject 1,000 mcg into the muscle every 30 (thirty) days.     Marland Kitchen enoxaparin (LOVENOX) 40 MG/0.4ML injection Inject 0.4 mLs (40 mg total) into the skin daily. Please take one syringe with you day of surgery so someone can show you how to use it. 7 Syringe 0  . esomeprazole (NEXIUM) 20  MG capsule TAKE ONE CAPSULE BY MOUTH TWICE DAILY BEFORE A MEAL 180 capsule 0  . fluticasone (FLONASE) 50 MCG/ACT nasal spray Place 2 sprays into  both nostrils daily as needed for rhinitis.     . furosemide (LASIX) 20 MG tablet TAKE 1 TABLET(20 MG) BY MOUTH DAILY 30 tablet 2  . gentamicin cream (GARAMYCIN) 0.1 % Apply 1 application topically 3 (three) times daily. 30 g 0  . HYDROmorphone (DILAUDID) 4 MG tablet Take 1 tablet (4 mg total) by mouth every 4 (four) hours as needed for severe pain. 30 tablet 0  . ibuprofen (ADVIL,MOTRIN) 600 MG tablet Take 1 tablet (600 mg total) by mouth every 8 (eight) hours as needed. 30 tablet 0  . levofloxacin (LEVAQUIN) 500 MG tablet Take 500 mg by mouth daily.    Marland Kitchen LYRICA 75 MG capsule     . mupirocin ointment (BACTROBAN) 2 % Place 1 application into the nose 2 (two) times daily. 22 g 0  . NONFORMULARY OR COMPOUNDED ITEM See pharmacy note 120 each 2  . omega-3 acid ethyl esters (LOVAZA) 1 g capsule Take 2 g by mouth daily.     Marland Kitchen oxyCODONE-acetaminophen (PERCOCET) 10-325 MG tablet Take 1 tablet by mouth every 6 (six) hours as needed for pain (for severe pain). 30 tablet 0  . polyethylene glycol powder (GLYCOLAX/MIRALAX) powder Take 17 g by mouth daily. (Patient taking differently: Take 17 g by mouth daily as needed (for constipation.). ) 3350 g 1  . ranitidine (ZANTAC) 150 MG tablet Take 150 mg by mouth at bedtime.     . rosuvastatin (CRESTOR) 10 MG tablet Take by mouth.    . zolpidem (AMBIEN) 10 MG tablet Take 10 mg by mouth at bedtime as needed for sleep.   3  . budesonide-formoterol (SYMBICORT) 160-4.5 MCG/ACT inhaler Inhale 2 puffs into the lungs 2 (two) times daily. 1 Inhaler 11  . ciprofloxacin (CIPRO) 500 MG tablet Take 1 tablet (500 mg total) by mouth 2 (two) times daily. (Patient not taking: Reported on 12/28/2017) 28 tablet 0  . COMBIVENT RESPIMAT 20-100 MCG/ACT AERS respimat Inhale 1 puff into the lungs 4 (four) times daily as needed. For wheezing.  (Patient not taking: Reported on 12/28/2017) 1 Inhaler 11  . diltiazem (CARDIZEM CD) 180 MG 24 hr capsule Take 1 capsule (180 mg total) by mouth daily. 90 capsule 3  . gabapentin (NEURONTIN) 300 MG capsule Take 1 capsule (300 mg total) by mouth 2 (two) times daily. (Patient not taking: Reported on 12/28/2017) 60 capsule 1  . gabapentin (NEURONTIN) 600 MG tablet Take 1 tablet (600 mg total) by mouth 3 (three) times daily. (Patient not taking: Reported on 12/28/2017) 90 tablet 1  . gentamicin cream (GARAMYCIN) 0.1 % Apply 1 application topically 3 (three) times daily. (Patient not taking: Reported on 12/28/2017) 30 g 0  . hydrOXYzine (VISTARIL) 50 MG capsule Take 50 mg by mouth 3 (three) times daily as needed for itching.  4  . meloxicam (MOBIC) 15 MG tablet TAKE 1 TABLET(15 MG) BY MOUTH DAILY (Patient not taking: Reported on 12/28/2017) 90 tablet 1  . traMADol (ULTRAM) 50 MG tablet Take 1 tablet (50 mg total) by mouth every 6 (six) hours as needed. (Patient not taking: Reported on 12/28/2017) 60 tablet 0   Current Facility-Administered Medications on File Prior to Visit  Medication Dose Route Frequency Provider Last Rate Last Dose  . betamethasone acetate-betamethasone sodium phosphate (CELESTONE) injection 3 mg  3 mg Intramuscular Once Evans, Kipp Brood M, DPM      . guaiFENesin (MUCINEX) 12 hr tablet 600 mg  600 mg Oral BID Katharina Caper, MD  There are no Patient Instructions on file for this visit. No Follow-up on file.  Mandel Seiden A Hayslee Casebolt, PA-C

## 2018-01-01 ENCOUNTER — Telehealth: Payer: Self-pay | Admitting: Podiatry

## 2018-01-01 NOTE — Telephone Encounter (Signed)
Patient called needs a refill on her pain medicine.

## 2018-01-01 NOTE — Progress Notes (Signed)
Subjective:  Patient presents today status post retrocalcaneal exostectomy with repair of Achilles tendon revisional surgery. DOS: 03/09/17.  Patient continues to complain of a small draining sinus to the surgical incision site right posterior heel.  Patient states overall she denies any significant pain. Patient also complains of continued pain and tenderness regarding her ankle to the left lower extremity.  She she demonstrates that with dorsiflexion of the foot onto the leg there is a very audible crepitus with pain to the left ankle.  Patient continues to have a significant amount of pain and tenderness to the left ankle joint especially with range of motion. Patient also had an appointment and was seen by pain management on 12/15/2017.  Patient states that she did not receive any pain medication and she will not receive any pain medication until her next appointment.  She presents today for further treatment and evaluation  Past Medical History:  Diagnosis Date  . Acid reflux   . Anxiety   . Chronic lower back pain    osteoarthritis in luber spine  . COPD (chronic obstructive pulmonary disease) (HCC)   . Depression    Bipolar  . Dysrhythmia    "skips" beats rx  . Heart murmur   . History of degenerative disc disease   . History of pulmonary embolus (PE)   . HTN (hypertension) 03/20/2015  . Hypertension   . Osteoarthritis of lumbosacral spine   . Peripheral vascular disease (HCC) 2013   had PE after hysterectomy  . Pneumonia    hx  . PSVT (paroxysmal supraventricular tachycardia) (HCC)   . Right Achilles tendinitis   . Thrombus    right lung in 2015, off anticoagulation now  . Transaminitis      Social History   Socioeconomic History  . Marital status: Single    Spouse name: Not on file  . Number of children: Not on file  . Years of education: Not on file  . Highest education level: Not on file  Social Needs  . Financial resource strain: Not on file  . Food insecurity -  worry: Not on file  . Food insecurity - inability: Not on file  . Transportation needs - medical: Not on file  . Transportation needs - non-medical: Not on file  Occupational History  . Not on file  Tobacco Use  . Smoking status: Never Smoker  . Smokeless tobacco: Never Used  Substance and Sexual Activity  . Alcohol use: No  . Drug use: No    Comment: Patient denies, but per previous H&P from Psych- cocaine use in the past  . Sexual activity: Yes    Birth control/protection: Surgical  Other Topics Concern  . Not on file  Social History Narrative  . Not on file    Objective/Physical Exam Neurovascular status intact.  Skin incisions appear to be well coapted with the exception of a small area of a draining sinus to the surgical incision site posterior heel right lower extremity measuring approximately 0.1 x 0.1 x 0.1 cm.  Wound base appears to be red and granular with a minimal amount of serosanguineous drainage.  Periwound integrity is intact.  No malodor.  There is no exposed bone muscle tendon ligament or joint.. No sign of acute inflammatory changes or infection.  No active bleeding noted.  Minimal edema noted to the surgical extremity. Pain on palpation noted to the posterior tubercle of the right calcaneus at the insertion of the Achilles tendon consistent with retrocalcaneal bursitis.  Musculoskeletal exam: Today there is a significant amount of pain on palpation to the anterior medial and lateral aspects of the patient's left ankle joint.  There is some audible crepitus with ankle joint motion.  Most significantly noted on dorsiflexion of the ankle joint.  Radiographic Exam:  There does appear to be a suspicious step-off of the talar dome posterior aspect best seen on lateral view left ankle.  Assessment: 1. s/p retrocalcaneal exostectomy with repair of Achilles tendon revision surgery. DOS: 03/09/17. 2.  Posterior tibial tendinitis right lower extremity-resolved 3.  Neuritis  right foot-resolved 4.  Ankle synovitis left with possible DJD and crepitus 5.  Small draining sinus right posterior heel  Plan of Care:  1. Patient was evaluated.  Wound cultures were reviewed today 2.  Refill prescription for pain medication, Percocet 5/325 mg 3.  Continue oral antibiotics as per wound cultures and sensitivities 4.  Patient has appointment with vascular on 12/28/2017 5.  Patient has a next appointment with pain management on 12/27/2017 6.  Today our office will follow up with the MRI.  Paperwork to receive the MRI was provided for the patient today. 7.  Return to clinic in 4 weeks   Felecia ShellingBrent M. Nashira Mcglynn, DPM [facility]  Dr. Felecia ShellingBrent M. Norris Brumbach, DPM    [address]                                        [place], [place] [address]                Office [phone]  Fax [phone]

## 2018-01-04 ENCOUNTER — Telehealth: Payer: Self-pay | Admitting: Podiatry

## 2018-01-05 ENCOUNTER — Ambulatory Visit: Payer: Medicare HMO | Admitting: Podiatry

## 2018-01-08 NOTE — Telephone Encounter (Signed)
Per Dr. Logan BoresEvans, No more opioid pain medications can be given from this office because she is seeing pain management at this time.

## 2018-01-18 DIAGNOSIS — Z9889 Other specified postprocedural states: Secondary | ICD-10-CM | POA: Diagnosis not present

## 2018-01-18 DIAGNOSIS — I10 Essential (primary) hypertension: Secondary | ICD-10-CM | POA: Diagnosis not present

## 2018-01-18 DIAGNOSIS — F419 Anxiety disorder, unspecified: Secondary | ICD-10-CM | POA: Diagnosis not present

## 2018-01-18 DIAGNOSIS — F1190 Opioid use, unspecified, uncomplicated: Secondary | ICD-10-CM | POA: Diagnosis not present

## 2018-01-18 DIAGNOSIS — J449 Chronic obstructive pulmonary disease, unspecified: Secondary | ICD-10-CM | POA: Diagnosis not present

## 2018-01-18 DIAGNOSIS — R6889 Other general symptoms and signs: Secondary | ICD-10-CM | POA: Diagnosis not present

## 2018-01-18 DIAGNOSIS — E785 Hyperlipidemia, unspecified: Secondary | ICD-10-CM | POA: Diagnosis not present

## 2018-01-18 DIAGNOSIS — G894 Chronic pain syndrome: Secondary | ICD-10-CM | POA: Diagnosis not present

## 2018-01-18 DIAGNOSIS — R7303 Prediabetes: Secondary | ICD-10-CM | POA: Diagnosis not present

## 2018-01-19 ENCOUNTER — Ambulatory Visit (INDEPENDENT_AMBULATORY_CARE_PROVIDER_SITE_OTHER): Payer: Medicare HMO | Admitting: Podiatry

## 2018-01-19 ENCOUNTER — Encounter: Payer: Self-pay | Admitting: Podiatry

## 2018-01-19 DIAGNOSIS — R6889 Other general symptoms and signs: Secondary | ICD-10-CM | POA: Diagnosis not present

## 2018-01-19 DIAGNOSIS — M659 Synovitis and tenosynovitis, unspecified: Secondary | ICD-10-CM

## 2018-01-19 DIAGNOSIS — M19072 Primary osteoarthritis, left ankle and foot: Secondary | ICD-10-CM

## 2018-01-19 MED ORDER — MELOXICAM 15 MG PO TABS: 15.0000 mg | ORAL_TABLET | Freq: Every day | ORAL | 1 refills | Status: AC

## 2018-01-19 NOTE — Progress Notes (Signed)
Subjective:  Patient presents today for bilateral foot pain.  Patient underwent retrocalcaneal exostectomy with repair of Achilles tendon right lower extremity on 03/09/2017.  Patient has had multiple complications including revisional surgery secondary to a fall injury as well as his postsurgical infection.  Patient recently finished her oral antibiotic.  She believes that the ulceration/draining sinus to the posterior heel has healed. Patient also presents for left ankle pain with crepitus.  Last visit MRI was ordered.  She presents today to review the MRI results and discuss further treatment options.  Past Medical History:  Diagnosis Date  . Acid reflux   . Anxiety   . Chronic lower back pain    osteoarthritis in luber spine  . COPD (chronic obstructive pulmonary disease) (HCC)   . Depression    Bipolar  . Dysrhythmia    "skips" beats rx  . Heart murmur   . History of degenerative disc disease   . History of pulmonary embolus (PE)   . HTN (hypertension) 03/20/2015  . Hypertension   . Osteoarthritis of lumbosacral spine   . Peripheral vascular disease (HCC) 2013   had PE after hysterectomy  . Pneumonia    hx  . PSVT (paroxysmal supraventricular tachycardia) (HCC)   . Right Achilles tendinitis   . Thrombus    right lung in 2015, off anticoagulation now  . Transaminitis      Social History   Socioeconomic History  . Marital status: Single    Spouse name: Not on file  . Number of children: Not on file  . Years of education: Not on file  . Highest education level: Not on file  Social Needs  . Financial resource strain: Not on file  . Food insecurity - worry: Not on file  . Food insecurity - inability: Not on file  . Transportation needs - medical: Not on file  . Transportation needs - non-medical: Not on file  Occupational History  . Not on file  Tobacco Use  . Smoking status: Never Smoker  . Smokeless tobacco: Never Used  Substance and Sexual Activity  . Alcohol  use: No  . Drug use: No    Comment: Patient denies, but per previous H&P from Psych- cocaine use in the past  . Sexual activity: Yes    Birth control/protection: Surgical  Other Topics Concern  . Not on file  Social History Narrative  . Not on file    Objective/Physical Exam Physical Exam General: The patient is alert and oriented x3 in no acute distress.  Dermatology: Skin is cool, dry and supple bilateral lower extremities. Negative for open lesions or macerations.  Vascular: Palpable pedal pulses bilaterally. No edema or erythema noted. Capillary refill within normal limits.  Neurological: Epicritic and protective threshold grossly intact bilaterally.   Musculoskeletal exam: Today there is a significant amount of pain on palpation to the anterior medial and lateral aspects of the patient's left ankle joint.  There is some audible crepitus with ankle joint motion.  Most significantly noted on dorsiflexion of the ankle joint.   MRI Impression LT ankle: Insertional Achilles tendinopathy with associated retrocalcaneal bursitis and prominent dorsal process of the calcaneus all compatible with Haglund's deformity.  Small subchondral cyst in the posterior aspect of the lateral talus. A larger area of T2 hyperintensity in the distal fibula may be degenerative in nature or could be a chondroid lesion such as enchondroma. No aggressive bony lesion is identified.   Assessment: 1. s/p retrocalcaneal exostectomy with repair of  Achilles tendon revision surgery. DOS: 03/09/17. 2.  Posterior tibial tendinitis right lower extremity-resolved 3.  Neuritis right foot-resolved 4.  Ankle synovitis left with possible DJD and crepitus 5.  Small draining sinus right posterior heel-resolved  Plan of Care:  1. Patient was evaluated.  MRI reviewed today. 2.  Patient continues to experience pain and sensitivity to the left ankle.  Today injection of 0.5 cc Celestone Soluspan injected in the lateral  aspect of the patient's left ankle joint. 3.  Patient was also seen by pain management on 12/27/2017 at which time opioid pain medication was not recommended for long-term use.  I explained to the patient that I can no longer provide opioid pain prescriptions.  Patient understands. 4.  Prescription for meloxicam 15 mg 5.  Return to clinic as needed  Felecia ShellingBrent M. Keyna Blizard, DPM [facility]  Dr. Felecia ShellingBrent M. Deloise Marchant, DPM    [address]                                        [place], [place] [address]                Office [phone]  Fax [phone]

## 2018-01-30 DIAGNOSIS — Z79899 Other long term (current) drug therapy: Secondary | ICD-10-CM | POA: Diagnosis not present

## 2018-01-30 DIAGNOSIS — Z5181 Encounter for therapeutic drug level monitoring: Secondary | ICD-10-CM | POA: Diagnosis not present

## 2018-02-01 ENCOUNTER — Encounter (INDEPENDENT_AMBULATORY_CARE_PROVIDER_SITE_OTHER): Payer: Medicare HMO

## 2018-02-01 ENCOUNTER — Ambulatory Visit (INDEPENDENT_AMBULATORY_CARE_PROVIDER_SITE_OTHER): Payer: Medicare HMO | Admitting: Vascular Surgery

## 2018-02-01 DIAGNOSIS — Z79899 Other long term (current) drug therapy: Secondary | ICD-10-CM | POA: Diagnosis not present

## 2018-02-01 DIAGNOSIS — Z5181 Encounter for therapeutic drug level monitoring: Secondary | ICD-10-CM | POA: Diagnosis not present

## 2018-02-05 DIAGNOSIS — Z79899 Other long term (current) drug therapy: Secondary | ICD-10-CM | POA: Diagnosis not present

## 2018-02-05 DIAGNOSIS — Z5181 Encounter for therapeutic drug level monitoring: Secondary | ICD-10-CM | POA: Diagnosis not present

## 2018-02-07 DIAGNOSIS — Z79899 Other long term (current) drug therapy: Secondary | ICD-10-CM | POA: Diagnosis not present

## 2018-02-07 DIAGNOSIS — Z5181 Encounter for therapeutic drug level monitoring: Secondary | ICD-10-CM | POA: Diagnosis not present

## 2018-02-12 DIAGNOSIS — Z79899 Other long term (current) drug therapy: Secondary | ICD-10-CM | POA: Diagnosis not present

## 2018-02-12 DIAGNOSIS — Z5181 Encounter for therapeutic drug level monitoring: Secondary | ICD-10-CM | POA: Diagnosis not present

## 2018-02-14 DIAGNOSIS — Z5181 Encounter for therapeutic drug level monitoring: Secondary | ICD-10-CM | POA: Diagnosis not present

## 2018-02-14 DIAGNOSIS — R6889 Other general symptoms and signs: Secondary | ICD-10-CM | POA: Diagnosis not present

## 2018-02-14 DIAGNOSIS — Z79899 Other long term (current) drug therapy: Secondary | ICD-10-CM | POA: Diagnosis not present

## 2018-02-19 DIAGNOSIS — Z79899 Other long term (current) drug therapy: Secondary | ICD-10-CM | POA: Diagnosis not present

## 2018-02-19 DIAGNOSIS — Z5181 Encounter for therapeutic drug level monitoring: Secondary | ICD-10-CM | POA: Diagnosis not present

## 2018-02-21 DIAGNOSIS — Z5181 Encounter for therapeutic drug level monitoring: Secondary | ICD-10-CM | POA: Diagnosis not present

## 2018-02-21 DIAGNOSIS — Z79899 Other long term (current) drug therapy: Secondary | ICD-10-CM | POA: Diagnosis not present

## 2018-02-27 DIAGNOSIS — Z79899 Other long term (current) drug therapy: Secondary | ICD-10-CM | POA: Diagnosis not present

## 2018-02-27 DIAGNOSIS — Z5181 Encounter for therapeutic drug level monitoring: Secondary | ICD-10-CM | POA: Diagnosis not present

## 2018-03-01 DIAGNOSIS — Z5181 Encounter for therapeutic drug level monitoring: Secondary | ICD-10-CM | POA: Diagnosis not present

## 2018-03-01 DIAGNOSIS — Z79899 Other long term (current) drug therapy: Secondary | ICD-10-CM | POA: Diagnosis not present

## 2018-03-01 DIAGNOSIS — R6889 Other general symptoms and signs: Secondary | ICD-10-CM | POA: Diagnosis not present

## 2018-03-05 DIAGNOSIS — Z79899 Other long term (current) drug therapy: Secondary | ICD-10-CM | POA: Diagnosis not present

## 2018-03-05 DIAGNOSIS — Z5181 Encounter for therapeutic drug level monitoring: Secondary | ICD-10-CM | POA: Diagnosis not present

## 2018-03-07 DIAGNOSIS — Z5181 Encounter for therapeutic drug level monitoring: Secondary | ICD-10-CM | POA: Diagnosis not present

## 2018-03-07 DIAGNOSIS — Z79899 Other long term (current) drug therapy: Secondary | ICD-10-CM | POA: Diagnosis not present

## 2018-03-13 DIAGNOSIS — Z5181 Encounter for therapeutic drug level monitoring: Secondary | ICD-10-CM | POA: Diagnosis not present

## 2018-03-13 DIAGNOSIS — Z79899 Other long term (current) drug therapy: Secondary | ICD-10-CM | POA: Diagnosis not present

## 2018-03-15 DIAGNOSIS — Z5181 Encounter for therapeutic drug level monitoring: Secondary | ICD-10-CM | POA: Diagnosis not present

## 2018-03-15 DIAGNOSIS — Z79899 Other long term (current) drug therapy: Secondary | ICD-10-CM | POA: Diagnosis not present

## 2018-03-19 DIAGNOSIS — Z79899 Other long term (current) drug therapy: Secondary | ICD-10-CM | POA: Diagnosis not present

## 2018-03-19 DIAGNOSIS — Z5181 Encounter for therapeutic drug level monitoring: Secondary | ICD-10-CM | POA: Diagnosis not present

## 2018-03-21 DIAGNOSIS — Z5181 Encounter for therapeutic drug level monitoring: Secondary | ICD-10-CM | POA: Diagnosis not present

## 2018-03-21 DIAGNOSIS — Z79899 Other long term (current) drug therapy: Secondary | ICD-10-CM | POA: Diagnosis not present

## 2018-03-27 DIAGNOSIS — Z79899 Other long term (current) drug therapy: Secondary | ICD-10-CM | POA: Diagnosis not present

## 2018-03-27 DIAGNOSIS — Z5181 Encounter for therapeutic drug level monitoring: Secondary | ICD-10-CM | POA: Diagnosis not present

## 2018-03-29 DIAGNOSIS — Z5181 Encounter for therapeutic drug level monitoring: Secondary | ICD-10-CM | POA: Diagnosis not present

## 2018-03-29 DIAGNOSIS — Z79899 Other long term (current) drug therapy: Secondary | ICD-10-CM | POA: Diagnosis not present

## 2018-04-02 DIAGNOSIS — Z5181 Encounter for therapeutic drug level monitoring: Secondary | ICD-10-CM | POA: Diagnosis not present

## 2018-04-02 DIAGNOSIS — Z79899 Other long term (current) drug therapy: Secondary | ICD-10-CM | POA: Diagnosis not present

## 2018-04-03 ENCOUNTER — Telehealth: Payer: Self-pay | Admitting: *Deleted

## 2018-04-03 ENCOUNTER — Telehealth: Payer: Self-pay | Admitting: Podiatry

## 2018-04-03 MED ORDER — GENTAMICIN SULFATE 0.1 % EX CREA: 1.0000 "application " | TOPICAL_CREAM | Freq: Three times a day (TID) | CUTANEOUS | 0 refills | Status: AC

## 2018-04-03 NOTE — Telephone Encounter (Signed)
I spoke with pt and she states she still has swelling and pain with that foot and scab area, and she has to watch how she walks or gets up on that foot she doesn't want to hurt the foot again, she couldn't stand it. I told pt I would transfer to scheduler and I also sent a message to the schedulers to call for appt.

## 2018-04-03 NOTE — Telephone Encounter (Signed)
Called left voicemail for pt to call office to schedule appt with Dr. Logan Bores.

## 2018-04-03 NOTE — Telephone Encounter (Signed)
[facility] sent request for gentamycin ointment refill. Pt has not been seen in office since 01/2018 and longer for wound.

## 2018-04-04 DIAGNOSIS — Z5181 Encounter for therapeutic drug level monitoring: Secondary | ICD-10-CM | POA: Diagnosis not present

## 2018-04-04 DIAGNOSIS — Z79899 Other long term (current) drug therapy: Secondary | ICD-10-CM | POA: Diagnosis not present

## 2018-04-11 DIAGNOSIS — Z79899 Other long term (current) drug therapy: Secondary | ICD-10-CM | POA: Diagnosis not present

## 2018-04-11 DIAGNOSIS — Z5181 Encounter for therapeutic drug level monitoring: Secondary | ICD-10-CM | POA: Diagnosis not present

## 2018-04-12 DIAGNOSIS — F331 Major depressive disorder, recurrent, moderate: Secondary | ICD-10-CM | POA: Diagnosis not present

## 2018-04-12 DIAGNOSIS — Z79899 Other long term (current) drug therapy: Secondary | ICD-10-CM | POA: Diagnosis not present

## 2018-04-12 DIAGNOSIS — F064 Anxiety disorder due to known physiological condition: Secondary | ICD-10-CM | POA: Diagnosis not present

## 2018-04-12 DIAGNOSIS — F410 Panic disorder [episodic paroxysmal anxiety] without agoraphobia: Secondary | ICD-10-CM | POA: Diagnosis not present

## 2018-04-12 DIAGNOSIS — F4310 Post-traumatic stress disorder, unspecified: Secondary | ICD-10-CM | POA: Diagnosis not present

## 2018-04-12 DIAGNOSIS — R6889 Other general symptoms and signs: Secondary | ICD-10-CM | POA: Diagnosis not present

## 2018-04-23 DIAGNOSIS — Z5181 Encounter for therapeutic drug level monitoring: Secondary | ICD-10-CM | POA: Diagnosis not present

## 2018-04-23 DIAGNOSIS — Z79899 Other long term (current) drug therapy: Secondary | ICD-10-CM | POA: Diagnosis not present

## 2018-04-25 DIAGNOSIS — Z79899 Other long term (current) drug therapy: Secondary | ICD-10-CM | POA: Diagnosis not present

## 2018-04-25 DIAGNOSIS — Z5181 Encounter for therapeutic drug level monitoring: Secondary | ICD-10-CM | POA: Diagnosis not present

## 2018-05-23 DIAGNOSIS — Z79899 Other long term (current) drug therapy: Secondary | ICD-10-CM | POA: Diagnosis not present

## 2018-05-23 DIAGNOSIS — Z5181 Encounter for therapeutic drug level monitoring: Secondary | ICD-10-CM | POA: Diagnosis not present

## 2018-06-04 DIAGNOSIS — M25571 Pain in right ankle and joints of right foot: Secondary | ICD-10-CM | POA: Diagnosis not present

## 2018-06-04 DIAGNOSIS — R350 Frequency of micturition: Secondary | ICD-10-CM | POA: Diagnosis not present

## 2018-06-04 DIAGNOSIS — M25511 Pain in right shoulder: Secondary | ICD-10-CM | POA: Diagnosis not present

## 2018-06-04 DIAGNOSIS — G894 Chronic pain syndrome: Secondary | ICD-10-CM | POA: Diagnosis not present

## 2018-06-04 DIAGNOSIS — M25562 Pain in left knee: Secondary | ICD-10-CM | POA: Diagnosis not present

## 2018-06-04 DIAGNOSIS — K219 Gastro-esophageal reflux disease without esophagitis: Secondary | ICD-10-CM | POA: Diagnosis not present

## 2018-06-04 DIAGNOSIS — F419 Anxiety disorder, unspecified: Secondary | ICD-10-CM | POA: Diagnosis not present

## 2018-06-04 DIAGNOSIS — M5136 Other intervertebral disc degeneration, lumbar region: Secondary | ICD-10-CM | POA: Diagnosis not present

## 2018-06-04 DIAGNOSIS — G8929 Other chronic pain: Secondary | ICD-10-CM | POA: Diagnosis not present

## 2018-06-04 DIAGNOSIS — E782 Mixed hyperlipidemia: Secondary | ICD-10-CM | POA: Diagnosis not present

## 2018-06-04 DIAGNOSIS — R6889 Other general symptoms and signs: Secondary | ICD-10-CM | POA: Diagnosis not present

## 2018-06-04 DIAGNOSIS — M25561 Pain in right knee: Secondary | ICD-10-CM | POA: Diagnosis not present

## 2018-09-10 DIAGNOSIS — F4310 Post-traumatic stress disorder, unspecified: Secondary | ICD-10-CM | POA: Diagnosis not present

## 2018-09-10 DIAGNOSIS — R6889 Other general symptoms and signs: Secondary | ICD-10-CM | POA: Diagnosis not present

## 2018-09-10 DIAGNOSIS — F331 Major depressive disorder, recurrent, moderate: Secondary | ICD-10-CM | POA: Diagnosis not present

## 2018-09-10 DIAGNOSIS — F064 Anxiety disorder due to known physiological condition: Secondary | ICD-10-CM | POA: Diagnosis not present

## 2018-09-10 DIAGNOSIS — Z79899 Other long term (current) drug therapy: Secondary | ICD-10-CM | POA: Diagnosis not present

## 2018-09-10 DIAGNOSIS — F410 Panic disorder [episodic paroxysmal anxiety] without agoraphobia: Secondary | ICD-10-CM | POA: Diagnosis not present

## 2018-10-23 ENCOUNTER — Emergency Department: Payer: Medicare HMO

## 2018-10-23 ENCOUNTER — Other Ambulatory Visit: Payer: Self-pay

## 2018-10-23 ENCOUNTER — Emergency Department
Admission: EM | Admit: 2018-10-23 | Discharge: 2018-10-23 | Disposition: A | Discharge status: Home / Self Care | Payer: Medicare HMO | Attending: Student in an Organized Health Care Education/Training Program | Admitting: Student in an Organized Health Care Education/Training Program

## 2018-10-23 DIAGNOSIS — L02211 Cutaneous abscess of abdominal wall: Secondary | ICD-10-CM | POA: Diagnosis not present

## 2018-10-23 DIAGNOSIS — J449 Chronic obstructive pulmonary disease, unspecified: Secondary | ICD-10-CM | POA: Insufficient documentation

## 2018-10-23 DIAGNOSIS — Y998 Other external cause status: Secondary | ICD-10-CM | POA: Diagnosis not present

## 2018-10-23 DIAGNOSIS — S76312A Strain of muscle, fascia and tendon of the posterior muscle group at thigh level, left thigh, initial encounter: Secondary | ICD-10-CM | POA: Diagnosis not present

## 2018-10-23 DIAGNOSIS — Y9389 Activity, other specified: Secondary | ICD-10-CM | POA: Insufficient documentation

## 2018-10-23 DIAGNOSIS — S8012XA Contusion of left lower leg, initial encounter: Secondary | ICD-10-CM | POA: Insufficient documentation

## 2018-10-23 DIAGNOSIS — R2242 Localized swelling, mass and lump, left lower limb: Secondary | ICD-10-CM | POA: Diagnosis not present

## 2018-10-23 DIAGNOSIS — I10 Essential (primary) hypertension: Secondary | ICD-10-CM | POA: Diagnosis not present

## 2018-10-23 DIAGNOSIS — Z79899 Other long term (current) drug therapy: Secondary | ICD-10-CM | POA: Diagnosis not present

## 2018-10-23 DIAGNOSIS — W010XXA Fall on same level from slipping, tripping and stumbling without subsequent striking against object, initial encounter: Secondary | ICD-10-CM | POA: Insufficient documentation

## 2018-10-23 DIAGNOSIS — S3993XA Unspecified injury of pelvis, initial encounter: Secondary | ICD-10-CM | POA: Diagnosis not present

## 2018-10-23 DIAGNOSIS — R102 Pelvic and perineal pain: Secondary | ICD-10-CM | POA: Diagnosis not present

## 2018-10-23 DIAGNOSIS — S76309A Unspecified injury of muscle, fascia and tendon of the posterior muscle group at thigh level, unspecified thigh, initial encounter: Secondary | ICD-10-CM | POA: Diagnosis not present

## 2018-10-23 DIAGNOSIS — S79922A Unspecified injury of left thigh, initial encounter: Secondary | ICD-10-CM | POA: Diagnosis not present

## 2018-10-23 DIAGNOSIS — M79605 Pain in left leg: Secondary | ICD-10-CM | POA: Diagnosis not present

## 2018-10-23 DIAGNOSIS — Y92017 Garden or yard in single-family (private) house as the place of occurrence of the external cause: Secondary | ICD-10-CM | POA: Insufficient documentation

## 2018-10-23 DIAGNOSIS — S7012XA Contusion of left thigh, initial encounter: Secondary | ICD-10-CM | POA: Diagnosis not present

## 2018-10-23 DIAGNOSIS — M7989 Other specified soft tissue disorders: Secondary | ICD-10-CM | POA: Diagnosis not present

## 2018-10-23 DIAGNOSIS — M79652 Pain in left thigh: Secondary | ICD-10-CM | POA: Diagnosis not present

## 2018-10-23 LAB — BASIC METABOLIC PANEL
ANION GAP: 4 — AB (ref 5–15)
BUN: 12 mg/dL (ref 6–20)
CO2: 28 mmol/L (ref 22–32)
Calcium: 9.1 mg/dL (ref 8.9–10.3)
Chloride: 108 mmol/L (ref 98–111)
Creatinine, Ser: 0.68 mg/dL (ref 0.44–1.00)
GFR calc Af Amer: >60 mL/min (ref >60)
GFR calc non Af Amer: >60 mL/min (ref >60)
GLUCOSE: 99 mg/dL (ref 70–99)
Potassium: 3.6 mmol/L (ref 3.5–5.1)
Sodium: 140 mmol/L (ref 135–145)

## 2018-10-23 LAB — CBC
HCT: 35.1 % — ABNORMAL LOW (ref 36.0–46.0)
Hemoglobin: 11.7 g/dL — ABNORMAL LOW (ref 12.0–15.0)
MCH: 30.9 pg (ref 26.0–34.0)
MCHC: 33.3 g/dL (ref 30.0–36.0)
MCV: 92.6 fL (ref 80.0–100.0)
Platelets: 393 10*3/uL (ref 150–400)
RBC: 3.79 MIL/uL — AB (ref 3.87–5.11)
RDW: 14.4 % (ref 11.5–15.5)
WBC: 6.4 10*3/uL (ref 4.0–10.5)
nRBC: 0 % (ref 0.0–0.2)

## 2018-10-23 MED ORDER — CLINDAMYCIN HCL 300 MG PO CAPS: 300.0000 mg | ORAL_CAPSULE | Freq: Three times a day (TID) | ORAL | 0 refills | Status: DC

## 2018-10-23 MED ORDER — HYDROCODONE-ACETAMINOPHEN 5-325 MG PO TABS: 1.0000 | ORAL_TABLET | ORAL | 0 refills | Status: AC | PRN

## 2018-10-23 MED ORDER — CLINDAMYCIN HCL 300 MG PO CAPS: 300.0000 mg | ORAL_CAPSULE | Freq: Three times a day (TID) | ORAL | 0 refills | Status: AC

## 2018-10-23 MED ORDER — LIDOCAINE-EPINEPHRINE-TETRACAINE (LET) SOLUTION
3.0000 mL | Freq: Once | NASAL | Status: AC
  Administered 2018-10-23: 3 mL via TOPICAL

## 2018-10-23 MED ORDER — LIDOCAINE-EPINEPHRINE-TETRACAINE (LET) SOLUTION
NASAL | Status: AC
  Filled 2018-10-23: qty 3

## 2018-10-23 MED ORDER — HYDROCODONE-ACETAMINOPHEN 5-325 MG PO TABS
1.0000 | ORAL_TABLET | Freq: Once | ORAL | Status: AC
  Administered 2018-10-23: 1 via ORAL
  Filled 2018-10-23: qty 1

## 2018-10-23 NOTE — ED Notes (Signed)
Verbal orders obtained from Dr. Scotty CourtStafford via telephone conversation.

## 2018-10-23 NOTE — ED Triage Notes (Addendum)
PT fell X 1 week ago onto left hip. Pt has large amount of blue/purple bruising to entire left buttock and down posterior left thigh wrapping around to inner left thigh and calf of left leg. Swelling to left thigh present. Pt states pain is gnawing and radiates down through left calf. Reports tailbone pain as well. Pt also c/o right lower abdomen abscess. Pt alert and oriented X4, active, cooperative, pt in NAD. RR even and unlabored, color WNL.

## 2018-10-23 NOTE — ED Notes (Signed)
Pt to CT and xray  

## 2018-10-23 NOTE — Discharge Instructions (Addendum)
Your scanner work-up in the ER shows that you have a tear of the hamstring your left leg which is causing all of the bruising.  Recommend ice and rest.  Crutches were provided to help with ambulation.  Please follow-up with PCP as well as orthopedics.  Return for worsening symptoms questions or concerns.

## 2018-10-23 NOTE — ED Provider Notes (Addendum)
[facility] Emergency Department Provider Note    First MD Initiated Contact with Patient 10/23/18 1844     (approximate)  I have reviewed the triage vital signs and the nursing notes.   HISTORY  Chief Complaint Hip Pain and Bleeding/Bruising    HPI Rebecca Ramos is a 57 y.o. female below listed past medical history presents the ER for swelling and pain to the left hip and thigh that occurred but is progressively worsening since 1 week ago when she had a mechanical fall in her yard slipping on wet grass.  States that she is been ambulating since then but is having worsening swelling and pain in the posterior thigh.  Is not on any blood thinners.  Does have a history of blood clot.  Denies any chest pain or shortness of breath.  Is also complaining about pain to the right abdominal wall that has been intermittently draining pus.  States that she irritated it when she fell same time.  No recent antibiotics.    Past Medical History:  Diagnosis Date  . Acid reflux   . Anxiety   . Chronic lower back pain    osteoarthritis in luber spine  . COPD (chronic obstructive pulmonary disease) (HCC)   . Depression    Bipolar  . Dysrhythmia    "skips" beats rx  . Heart murmur   . History of degenerative disc disease   . History of pulmonary embolus (PE)   . HTN (hypertension) 03/20/2015  . Hypertension   . Osteoarthritis of lumbosacral spine   . Peripheral vascular disease (HCC) 2013   had PE after hysterectomy  . Pneumonia    hx  . PSVT (paroxysmal supraventricular tachycardia) (HCC)   . Right Achilles tendinitis   . Thrombus    right lung in 2015, off anticoagulation now  . Transaminitis    Family History  Problem Relation Age of Onset  . CAD Mother   . Heart disease Mother        Pacemaker  . CAD Sister   . Heart disease Sister        Heart surgery x 1  . Throat cancer Brother   . CAD Maternal Grandmother   . CAD Maternal Grandfather    Past  Surgical History:  Procedure Laterality Date  . ABDOMINAL HYSTERECTOMY    . ACHILLES TENDON SURGERY Right 02/09/2017   Procedure: ACHILLES TENDON REPAIR;  Surgeon: Felecia ShellingBrent M Evans, DPM;  [facility];  Service: Podiatry;  Laterality: Right;  . ANKLE ARTHROSCOPY Right 02/09/2017   Procedure: ANKLE ARTHROSCOPY RIGHT FOOT;  Surgeon: Felecia ShellingBrent M Evans, DPM;  [facility];  Service: Podiatry;  Laterality: Right;  . CALCANEAL OSTEOTOMY Right 02/09/2017   Procedure: RETRO CALCANEOUS EXOSTECTOMY;  Surgeon: Felecia ShellingBrent M Evans, DPM;  [facility];  Service: Podiatry;  Laterality: Right;  . CESAREAN SECTION Bilateral   . CESAREAN SECTION Bilateral   . ELBOW SURGERY     bilateral  . thumb surgery     right   . TONSILLECTOMY     Patient Active Problem List   Diagnosis Date Noted  . Pressure injury of skin of right heel 12/29/2017  . Pre-op evaluation 01/16/2017  . Paresthesia of left arm 12/08/2016  . Headache 11/10/2016  . Anxiety 11/10/2016  . Palpitations 11/09/2016  . Fatty liver 11/09/2016  . Abdominal pain, left upper quadrant 10/20/2016  . Nausea without vomiting 08/09/2016  . Pedal edema 08/09/2016  . Frequent urination 06/23/2016  .  Chronic pain syndrome 06/23/2016  . GERD (gastroesophageal reflux disease) 06/10/2016  . COPD (chronic obstructive pulmonary disease) (HCC) 06/08/2016  . Acute bronchitis 06/08/2016  . Hyperlipidemia 06/08/2016  . Obesity 06/08/2016  . Cocaine abuse (HCC) 06/08/2016  . Tobacco abuse counseling 06/08/2016  . Atypical chest pain 06/06/2016  . Cocaine use disorder, moderate, dependence (HCC) 03/28/2016  . Severe recurrent major depression without psychotic features (HCC) 03/26/2016  . Suicide attempt (HCC) 03/26/2016  . Chronic back pain 03/26/2016  . HTN (hypertension) 03/20/2015      Prior to Admission medications   Medication Sig Start Date End Date Taking? Authorizing Provider  ALPRAZolam Prudy Feeler) 1 MG tablet Take 1 mg by mouth 4 (four) times  daily as needed for anxiety.  05/18/16   [provider]  Blood Pressure Monitoring (BLOOD PRESSURE CUFF) MISC 1 each by Does not apply route daily. 06/16/16   Loura Pardon, NP  budesonide-formoterol (SYMBICORT) 160-4.5 MCG/ACT inhaler Inhale 2 puffs into the lungs 2 (two) times daily. 09/27/16 09/27/17  Glori Luis, MD  ciprofloxacin (CIPRO) 500 MG tablet Take 1 tablet (500 mg total) by mouth 2 (two) times daily. Patient not taking: Reported on 12/28/2017 12/19/17   Felecia Shelling, DPM  clindamycin (CLEOCIN) 300 MG capsule Take 1 capsule (300 mg total) by mouth 3 (three) times daily for 7 days. 10/23/18 10/30/18  Willy Eddy, MD  COMBIVENT RESPIMAT 20-100 MCG/ACT AERS respimat Inhale 1 puff into the lungs 4 (four) times daily as needed. For wheezing. Patient not taking: Reported on 12/28/2017 06/10/16   Loura Pardon, NP  cyanocobalamin (,VITAMIN B-12,) 1000 MCG/ML injection Inject 1,000 mcg into the muscle every 30 (thirty) days.  01/13/16   [provider]  diltiazem (CARDIZEM CD) 180 MG 24 hr capsule Take 1 capsule (180 mg total) by mouth daily. 11/29/16 02/27/17  End, Cristal Deer, MD  enoxaparin (LOVENOX) 40 MG/0.4ML injection Inject 0.4 mLs (40 mg total) into the skin daily. Please take one syringe with you day of surgery so someone can show you how to use it. 02/01/17   Logan Bores, Larena Glassman, DPM  esomeprazole (NEXIUM) 20 MG capsule TAKE ONE CAPSULE BY MOUTH TWICE DAILY BEFORE A MEAL 04/12/17   Glori Luis, MD  fluticasone [facility]) 50 MCG/ACT nasal spray Place 2 sprays into both nostrils daily as needed for rhinitis.     [provider]  furosemide (LASIX) 20 MG tablet TAKE 1 TABLET(20 MG) BY MOUTH DAILY 09/30/16   Glori Luis, MD  gabapentin (NEURONTIN) 300 MG capsule Take 1 capsule (300 mg total) by mouth 2 (two) times daily. Patient not taking: Reported on 12/28/2017 09/08/17   Felecia Shelling, DPM  gabapentin (NEURONTIN) 600 MG tablet Take 1 tablet  (600 mg total) by mouth 3 (three) times daily. Patient not taking: Reported on 12/28/2017 10/03/17   Felecia Shelling, DPM  gentamicin cream (GARAMYCIN) 0.1 % Apply 1 application topically 3 (three) times daily. 10/03/17   Felecia Shelling, DPM  gentamicin cream (GARAMYCIN) 0.1 % Apply 1 application topically 3 (three) times daily. 04/03/18   Felecia Shelling, DPM  HYDROcodone-acetaminophen (NORCO) 5-325 MG tablet Take 1 tablet by mouth every 4 (four) hours as needed for moderate pain. 10/23/18   Willy Eddy, MD  HYDROmorphone (DILAUDID) 4 MG tablet Take 1 tablet (4 mg total) by mouth every 4 (four) hours as needed for severe pain. 04/24/17   Felecia Shelling, DPM  hydrOXYzine (VISTARIL) 50 MG capsule Take 50 mg  by mouth 3 (three) times daily as needed for itching. 01/29/17   [provider]  ibuprofen (ADVIL,MOTRIN) 600 MG tablet Take 1 tablet (600 mg total) by mouth every 8 (eight) hours as needed. 01/08/17   Merrily Brittle, MD  levofloxacin (LEVAQUIN) 500 MG tablet Take 500 mg by mouth daily.    [provider]  LYRICA 75 MG capsule  12/18/17   [provider]  mupirocin ointment (BACTROBAN) 2 % Place 1 application into the nose 2 (two) times daily. 12/19/17   Felecia Shelling, DPM  NONFORMULARY OR COMPOUNDED ITEM See pharmacy note 06/06/17   Felecia Shelling, DPM  omega-3 acid ethyl esters (LOVAZA) 1 g capsule Take 2 g by mouth daily.  06/17/16   [provider]  oxyCODONE-acetaminophen (PERCOCET) 10-325 MG tablet Take 1 tablet by mouth every 6 (six) hours as needed for pain (for severe pain). 12/19/17   Felecia Shelling, DPM  polyethylene glycol powder (GLYCOLAX/MIRALAX) powder Take 17 g by mouth daily. Patient taking differently: Take 17 g by mouth daily as needed (for constipation.).  10/21/16   Glori Luis, MD  ranitidine (ZANTAC) 150 MG tablet Take 150 mg by mouth at bedtime.     [provider]  rosuvastatin (CRESTOR) 10 MG tablet Take by mouth. 12/02/17  12/02/18  [provider]  silver sulfADIAZINE (SILVADENE) 1 % cream Apply 1 application topically daily. 12/28/17   Stegmayer, Ranae Plumber, PA-C  traMADol (ULTRAM) 50 MG tablet Take 1 tablet (50 mg total) by mouth every 6 (six) hours as needed. Patient not taking: Reported on 12/28/2017 06/09/17   Felecia Shelling, DPM  zolpidem (AMBIEN) 10 MG tablet Take 10 mg by mouth at bedtime as needed for sleep.  06/20/16   [provider]    Allergies Honey bee venom; Sulfacetamide sodium; Sulfamethoxazole-trimethoprim; Sulfasalazine; Levofloxacin; Sulfa antibiotics; and Codeine    Social History Social History   Tobacco Use  . Smoking status: Never Smoker  . Smokeless tobacco: Never Used  Substance Use Topics  . Alcohol use: No  . Drug use: No    Comment: Patient denies, but per previous H&P from Psych- cocaine use in the past    Review of Systems Patient denies headaches, rhinorrhea, blurry vision, numbness, shortness of breath, chest pain, edema, cough, abdominal pain, nausea, vomiting, diarrhea, dysuria, fevers, rashes or hallucinations unless otherwise stated above in HPI. ____________________________________________   PHYSICAL EXAM:  VITAL SIGNS: Vitals:   10/23/18 1652 10/23/18 2021  BP: 138/85 134/78  Pulse: 80 62  Resp: 20 18  Temp: 97.8 F (36.6 C)   SpO2: 100% 98%    Constitutional: Alert and oriented.  Eyes: Conjunctivae are normal.  Head: Atraumatic. Nose: No congestion/rhinnorhea. Mouth/Throat: Mucous membranes are moist.   Neck: No stridor. Painless ROM.  Cardiovascular: Normal rate, regular rhythm. Grossly normal heart sounds.  Good peripheral circulation. Respiratory: Normal respiratory effort.  No retractions. Lungs CTAB. Gastrointestinal: Small 3cm fluctuant abscess to the right lower quadrant abdominal wall with scant purulent drainage and small amount of surrounding cellulitis.  Soft and nontender. No distention. No abdominal bruits. No CVA  tenderness. Genitourinary:  Musculoskeletal: Significant swelling to the left thigh.  Compartments are soft.  She is able to range her lower extremity and knee but has dependent ecchymosis going down from the left gluteus down the posterior aspect down to the calf.  Strong DP and PT pulses.  Neurovascularly intact distally.  Swelling no joint effusions. Neurologic:  Normal speech and  language. No gross focal neurologic deficits are appreciated. No facial droop Skin:  Skin is warm, dry and intact. No rash noted. Psychiatric: Mood and affect are normal. Speech and behavior are normal.  ____________________________________________   LABS (all labs ordered are listed, but only abnormal results are displayed)  Results for orders placed or performed during the hospital encounter of 10/23/18 (from the past 24 hour(s))  Basic metabolic panel     Status: Abnormal   Collection Time: 10/23/18  5:11 PM  Result Value Ref Range   Sodium 140 135 - 145 mmol/L   Potassium 3.6 3.5 - 5.1 mmol/L   Chloride 108 98 - 111 mmol/L   CO2 28 22 - 32 mmol/L   Glucose, Bld 99 70 - 99 mg/dL   BUN 12 6 - 20 mg/dL   Creatinine, Ser 2.95 0.44 - 1.00 mg/dL   Calcium 9.1 8.9 - 62.1 mg/dL   GFR calc non Af Amer >60 >60 mL/min   GFR calc Af Amer >60 >60 mL/min   Anion gap 4 (L) 5 - 15  CBC     Status: Abnormal   Collection Time: 10/23/18  5:11 PM  Result Value Ref Range   WBC 6.4 4.0 - 10.5 K/uL   RBC 3.79 (L) 3.87 - 5.11 MIL/uL   Hemoglobin 11.7 (L) 12.0 - 15.0 g/dL   HCT 30.8 (L) 65.7 - 84.6 %   MCV 92.6 80.0 - 100.0 fL   MCH 30.9 26.0 - 34.0 pg   MCHC 33.3 30.0 - 36.0 g/dL   RDW 96.2 95.2 - 84.1 %   Platelets 393 150 - 400 K/uL   nRBC 0.0 0.0 - 0.2 %   ____________________________________________ ____________________________________________  RADIOLOGY  I personally reviewed all radiographic images ordered to evaluate for the above acute complaints and reviewed radiology reports and findings.  These  findings were personally discussed with the patient.  Please see medical record for radiology report.  ____________________________________________   PROCEDURES  Procedure(s) performed:  Marland KitchenMarland KitchenIncision and Drainage Date/Time: 10/23/2018 8:57 PM Performed by: Willy Eddy, MD Authorized by: Willy Eddy, MD   Consent:    Consent obtained:  Verbal   Consent given by:  Patient   Risks discussed:  Bleeding, infection, incomplete drainage and pain   Alternatives discussed:  Alternative treatment, delayed treatment and observation Location:    Type:  Abscess   Location:  Trunk   Trunk location:  Abdomen Pre-procedure details:    Skin preparation:  Betadine Anesthesia (see MAR for exact dosages):    Anesthesia method:  Topical application   Topical anesthetic:  LET Procedure type:    Complexity:  Simple Procedure details:    Incision types:  Stab incision   Incision depth:  Subcutaneous   Scalpel blade:  11   Wound management:  Probed and deloculated   Drainage:  Purulent   Drainage amount:  Scant   Wound treatment:  Wound left open Post-procedure details:    Patient tolerance of procedure:  Tolerated well, no immediate complications      Critical Care performed: no ____________________________________________   INITIAL IMPRESSION / ASSESSMENT AND PLAN / ED COURSE  Pertinent labs & imaging results that were available during my care of the patient were reviewed by me and considered in my medical decision making (see chart for details).   DDX: Contusion, fracture, avulsion injury,  hematoma, doubt compartment syndrome  Rebecca Ramos is a 57 y.o. who presents to the ED with symptoms as described above.  Extensive  bruising and ecchymosis of the left leg.  X-rays not show no evidence of fracture.  Will order CT to further evaluate.  Does not seem clinically consistent with compartment syndrome based on duration.  Probable muscle tear but does also have some abnormal  calcifications in the posterior compartment where she has so much swelling.  Also with superficial abscess to the right lower quadrant which will require I&D.  Clinical Course as of Oct 23 2099  Tue Oct 23, 2018  2028 I&D performed and patient tolerated well.  Will place on antibiotics.  3 area of surrounding cellulitis.   [PR]    Clinical Course User Index [PR] Willy Eddy, MD   CT imaging shows evidence of muscle tear and surrounding hematoma and edema.  Get follow-up with orthopedics as well as crutches.  Discussed conservative management rest, ice and signs and symptoms for which she should return to the ER.  Repeat exam shows soft compartments.  Again this is not consistent with compartment syndrome.  Have discussed with the patient and available family all diagnostics and treatments performed thus far and all questions were answered to the best of my ability. The patient demonstrates understanding and agreement with plan.   As part of my medical decision making, I reviewed the following data within the electronic MEDICAL RECORD NUMBER Nursing notes reviewed and incorporated, Labs reviewed, notes from prior ED visits.   ____________________________________________   FINAL CLINICAL IMPRESSION(S) / ED DIAGNOSES  Final diagnoses:  Hamstring injury, initial encounter  Contusion of left lower leg, initial encounter  Cutaneous abscess of abdominal wall      NEW MEDICATIONS STARTED DURING THIS VISIT:  New Prescriptions   CLINDAMYCIN (CLEOCIN) 300 MG CAPSULE    Take 1 capsule (300 mg total) by mouth 3 (three) times daily for 7 days.   HYDROCODONE-ACETAMINOPHEN (NORCO) 5-325 MG TABLET    Take 1 tablet by mouth every 4 (four) hours as needed for moderate pain.     Note:  This document was prepared using Dragon voice recognition software and may include unintentional dictation errors.    Willy Eddy, MD 10/23/18 2059    Willy Eddy, MD 10/23/18 2100

## 2018-11-20 ENCOUNTER — Emergency Department: Payer: Medicare HMO

## 2018-11-20 ENCOUNTER — Emergency Department
Admission: EM | Admit: 2018-11-20 | Discharge: 2018-11-20 | Disposition: A | Discharge status: Home / Self Care | Payer: Medicare HMO | Attending: Emergency Medicine | Admitting: Emergency Medicine

## 2018-11-20 ENCOUNTER — Other Ambulatory Visit: Payer: Self-pay

## 2018-11-20 DIAGNOSIS — Y929 Unspecified place or not applicable: Secondary | ICD-10-CM | POA: Insufficient documentation

## 2018-11-20 DIAGNOSIS — R110 Nausea: Secondary | ICD-10-CM | POA: Diagnosis not present

## 2018-11-20 DIAGNOSIS — J449 Chronic obstructive pulmonary disease, unspecified: Secondary | ICD-10-CM | POA: Insufficient documentation

## 2018-11-20 DIAGNOSIS — R52 Pain, unspecified: Secondary | ICD-10-CM | POA: Diagnosis not present

## 2018-11-20 DIAGNOSIS — S0101XA Laceration without foreign body of scalp, initial encounter: Secondary | ICD-10-CM | POA: Insufficient documentation

## 2018-11-20 DIAGNOSIS — R58 Hemorrhage, not elsewhere classified: Secondary | ICD-10-CM | POA: Diagnosis not present

## 2018-11-20 DIAGNOSIS — Z23 Encounter for immunization: Secondary | ICD-10-CM | POA: Diagnosis not present

## 2018-11-20 DIAGNOSIS — I10 Essential (primary) hypertension: Secondary | ICD-10-CM | POA: Insufficient documentation

## 2018-11-20 DIAGNOSIS — S060X1A Concussion with loss of consciousness of 30 minutes or less, initial encounter: Secondary | ICD-10-CM | POA: Diagnosis not present

## 2018-11-20 DIAGNOSIS — Z79899 Other long term (current) drug therapy: Secondary | ICD-10-CM | POA: Diagnosis not present

## 2018-11-20 DIAGNOSIS — S0990XA Unspecified injury of head, initial encounter: Secondary | ICD-10-CM | POA: Diagnosis not present

## 2018-11-20 DIAGNOSIS — R410 Disorientation, unspecified: Secondary | ICD-10-CM | POA: Diagnosis not present

## 2018-11-20 DIAGNOSIS — Y998 Other external cause status: Secondary | ICD-10-CM | POA: Insufficient documentation

## 2018-11-20 DIAGNOSIS — Y9389 Activity, other specified: Secondary | ICD-10-CM | POA: Diagnosis not present

## 2018-11-20 DIAGNOSIS — W19XXXA Unspecified fall, initial encounter: Secondary | ICD-10-CM | POA: Diagnosis not present

## 2018-11-20 DIAGNOSIS — W01190A Fall on same level from slipping, tripping and stumbling with subsequent striking against furniture, initial encounter: Secondary | ICD-10-CM | POA: Insufficient documentation

## 2018-11-20 DIAGNOSIS — R51 Headache: Secondary | ICD-10-CM | POA: Diagnosis not present

## 2018-11-20 MED ORDER — ONDANSETRON 4 MG PO TBDP
ORAL_TABLET | ORAL | Status: AC
  Administered 2018-11-20: 13:00:00
  Filled 2018-11-20: qty 1

## 2018-11-20 MED ORDER — OXYCODONE-ACETAMINOPHEN 5-325 MG PO TABS
1.0000 | ORAL_TABLET | Freq: Once | ORAL | Status: AC
  Administered 2018-11-20: 1 via ORAL
  Filled 2018-11-20: qty 1

## 2018-11-20 MED ORDER — LIDOCAINE-EPINEPHRINE 2 %-1:100000 IJ SOLN
20.0000 mL | Freq: Once | INTRAMUSCULAR | Status: AC
  Administered 2018-11-20: 20 mL via INTRADERMAL
  Filled 2018-11-20: qty 1

## 2018-11-20 MED ORDER — OXYCODONE-ACETAMINOPHEN 5-325 MG PO TABS: 1.0000 | ORAL_TABLET | ORAL | 0 refills | Status: AC | PRN

## 2018-11-20 MED ORDER — TETANUS-DIPHTH-ACELL PERTUSSIS 5-2.5-18.5 LF-MCG/0.5 IM SUSP
0.5000 mL | Freq: Once | INTRAMUSCULAR | Status: AC
  Administered 2018-11-20: 0.5 mL via INTRAMUSCULAR
  Filled 2018-11-20: qty 0.5

## 2018-11-20 NOTE — ED Notes (Signed)
Patient transported to X-ray 

## 2018-11-20 NOTE — ED Notes (Signed)
Esign not working at this time. Pt verbalized discharge instructions and has no questions at this time. 

## 2018-11-20 NOTE — ED Notes (Signed)
Pt back from CT

## 2018-11-20 NOTE — ED Triage Notes (Signed)
Pt come via [facility] from home after a fall. EMS reports pt had foot surgery and since has been unsteady on her feet and with balance. Pt had a brief LOC and little daised. Pt fell backwards and hit her head on glass table.  Pt is alert and oriented X4. Pt's head is covered with blood and unable to see cut at this time.  Pt states severe nausea. MD at bedside. Pt given ODT zofran.

## 2018-11-20 NOTE — ED Notes (Signed)
Patient transported to CT 

## 2018-11-20 NOTE — ED Notes (Signed)
Pt has lots of bleeding noted to back of head. Pt states pain in head from fall. No cuts noted to other parts of body. Unable to tell if laceration or cut is on back of head.

## 2018-11-20 NOTE — ED Provider Notes (Signed)
[facility] Emergency Department Provider Note  ____________________________________________   First MD Initiated Contact with Patient 11/20/18 1306     (approximate)  I have reviewed the triage vital signs and the nursing notes.   HISTORY  Chief Complaint Fall   HPI Rebecca Ramos is a 58 y.o. female with a history of COPD, peripheral vascular disease and PE no longer on anticoagulation was presenting after a fall.  The patient says that she had surgery in her right foot approximate 1 year ago and has had ambulatory difficulty ever since.  Says that she fell backwards today because of her ambulatory difficulty and hit her head on a glass table, breaking the table.  She suffered a laceration to the back of her head.  Possible short loss of consciousness.  Patient now feeling nauseous.  Was initially confused but is now back to her baseline mental status.  She is complaining of moderate to severe pain posteriorly to her head but denies any neck pain or any pain anywhere else.  Does not know the date of her last tetanus shot.  Does not report any weakness or numbness.   Past Medical History:  Diagnosis Date  . Acid reflux   . Anxiety   . Chronic lower back pain    osteoarthritis in luber spine  . COPD (chronic obstructive pulmonary disease) (HCC)   . Depression    Bipolar  . Dysrhythmia    "skips" beats rx  . Heart murmur   . History of degenerative disc disease   . History of pulmonary embolus (PE)   . HTN (hypertension) 03/20/2015  . Hypertension   . Osteoarthritis of lumbosacral spine   . Peripheral vascular disease (HCC) 2013   had PE after hysterectomy  . Pneumonia    hx  . PSVT (paroxysmal supraventricular tachycardia) (HCC)   . Right Achilles tendinitis   . Thrombus    right lung in 2015, off anticoagulation now  . Transaminitis     Patient Active Problem List   Diagnosis Date Noted  . Pressure injury of skin of right heel 12/29/2017  .  Pre-op evaluation 01/16/2017  . Paresthesia of left arm 12/08/2016  . Headache 11/10/2016  . Anxiety 11/10/2016  . Palpitations 11/09/2016  . Fatty liver 11/09/2016  . Abdominal pain, left upper quadrant 10/20/2016  . Nausea without vomiting 08/09/2016  . Pedal edema 08/09/2016  . Frequent urination 06/23/2016  . Chronic pain syndrome 06/23/2016  . GERD (gastroesophageal reflux disease) 06/10/2016  . COPD (chronic obstructive pulmonary disease) (HCC) 06/08/2016  . Acute bronchitis 06/08/2016  . Hyperlipidemia 06/08/2016  . Obesity 06/08/2016  . Cocaine abuse (HCC) 06/08/2016  . Tobacco abuse counseling 06/08/2016  . Atypical chest pain 06/06/2016  . Cocaine use disorder, moderate, dependence (HCC) 03/28/2016  . Severe recurrent major depression without psychotic features (HCC) 03/26/2016  . Suicide attempt (HCC) 03/26/2016  . Chronic back pain 03/26/2016  . HTN (hypertension) 03/20/2015    Past Surgical History:  Procedure Laterality Date  . ABDOMINAL HYSTERECTOMY    . ACHILLES TENDON SURGERY Right 02/09/2017   Procedure: ACHILLES TENDON REPAIR;  Surgeon: Felecia ShellingBrent M Evans, DPM;  [facility];  Service: Podiatry;  Laterality: Right;  . ANKLE ARTHROSCOPY Right 02/09/2017   Procedure: ANKLE ARTHROSCOPY RIGHT FOOT;  Surgeon: Felecia ShellingBrent M Evans, DPM;  [facility];  Service: Podiatry;  Laterality: Right;  . CALCANEAL OSTEOTOMY Right 02/09/2017   Procedure: RETRO CALCANEOUS EXOSTECTOMY;  Surgeon: Felecia ShellingBrent M Evans, DPM;  Location: [facility] OR;  Service: Podiatry;  Laterality: Right;  . CESAREAN SECTION Bilateral   . CESAREAN SECTION Bilateral   . ELBOW SURGERY     bilateral  . thumb surgery     right   . TONSILLECTOMY      Prior to Admission medications   Medication Sig Start Date End Date Taking? Authorizing Provider  ALPRAZolam Prudy Feeler) 1 MG tablet Take 1 mg by mouth 4 (four) times daily as needed for anxiety.  05/18/16   [provider]  Blood Pressure Monitoring (BLOOD PRESSURE  CUFF) MISC 1 each by Does not apply route daily. 06/16/16   Loura Pardon, NP  budesonide-formoterol (SYMBICORT) 160-4.5 MCG/ACT inhaler Inhale 2 puffs into the lungs 2 (two) times daily. 09/27/16 09/27/17  Glori Luis, MD  ciprofloxacin (CIPRO) 500 MG tablet Take 1 tablet (500 mg total) by mouth 2 (two) times daily. Patient not taking: Reported on 12/28/2017 12/19/17   Felecia Shelling, DPM  COMBIVENT RESPIMAT 20-100 MCG/ACT AERS respimat Inhale 1 puff into the lungs 4 (four) times daily as needed. For wheezing. Patient not taking: Reported on 12/28/2017 06/10/16   Loura Pardon, NP  cyanocobalamin (,VITAMIN B-12,) 1000 MCG/ML injection Inject 1,000 mcg into the muscle every 30 (thirty) days.  01/13/16   [provider]  diltiazem (CARDIZEM CD) 180 MG 24 hr capsule Take 1 capsule (180 mg total) by mouth daily. 11/29/16 02/27/17  End, Cristal Deer, MD  enoxaparin (LOVENOX) 40 MG/0.4ML injection Inject 0.4 mLs (40 mg total) into the skin daily. Please take one syringe with you day of surgery so someone can show you how to use it. 02/01/17   Logan Bores, Larena Glassman, DPM  esomeprazole (NEXIUM) 20 MG capsule TAKE ONE CAPSULE BY MOUTH TWICE DAILY BEFORE A MEAL 04/12/17   Glori Luis, MD  fluticasone [facility]) 50 MCG/ACT nasal spray Place 2 sprays into both nostrils daily as needed for rhinitis.     [provider]  furosemide (LASIX) 20 MG tablet TAKE 1 TABLET(20 MG) BY MOUTH DAILY 09/30/16   Glori Luis, MD  gabapentin (NEURONTIN) 300 MG capsule Take 1 capsule (300 mg total) by mouth 2 (two) times daily. Patient not taking: Reported on 12/28/2017 09/08/17   Felecia Shelling, DPM  gabapentin (NEURONTIN) 600 MG tablet Take 1 tablet (600 mg total) by mouth 3 (three) times daily. Patient not taking: Reported on 12/28/2017 10/03/17   Felecia Shelling, DPM  gentamicin cream (GARAMYCIN) 0.1 % Apply 1 application topically 3 (three) times daily. 10/03/17   Felecia Shelling, DPM  gentamicin cream  (GARAMYCIN) 0.1 % Apply 1 application topically 3 (three) times daily. 04/03/18   Felecia Shelling, DPM  HYDROcodone-acetaminophen (NORCO) 5-325 MG tablet Take 1 tablet by mouth every 4 (four) hours as needed for moderate pain. 10/23/18   Willy Eddy, MD  HYDROmorphone (DILAUDID) 4 MG tablet Take 1 tablet (4 mg total) by mouth every 4 (four) hours as needed for severe pain. 04/24/17   Felecia Shelling, DPM  hydrOXYzine (VISTARIL) 50 MG capsule Take 50 mg by mouth 3 (three) times daily as needed for itching. 01/29/17   [provider]  ibuprofen (ADVIL,MOTRIN) 600 MG tablet Take 1 tablet (600 mg total) by mouth every 8 (eight) hours as needed. 01/08/17   Merrily Brittle, MD  levofloxacin (LEVAQUIN) 500 MG tablet Take 500 mg by mouth daily.    [provider]  LYRICA 75 MG capsule  12/18/17   [provider]  mupirocin ointment (BACTROBAN) 2 % Place 1 application into the nose 2 (two) times daily. 12/19/17   Felecia Shelling, DPM  NONFORMULARY OR COMPOUNDED ITEM See pharmacy note 06/06/17   Felecia Shelling, DPM  omega-3 acid ethyl esters (LOVAZA) 1 g capsule Take 2 g by mouth daily.  06/17/16   [provider]  oxyCODONE-acetaminophen (PERCOCET) 10-325 MG tablet Take 1 tablet by mouth every 6 (six) hours as needed for pain (for severe pain). 12/19/17   Felecia Shelling, DPM  polyethylene glycol powder (GLYCOLAX/MIRALAX) powder Take 17 g by mouth daily. Patient taking differently: Take 17 g by mouth daily as needed (for constipation.).  10/21/16   Glori Luis, MD  ranitidine (ZANTAC) 150 MG tablet Take 150 mg by mouth at bedtime.     [provider]  rosuvastatin (CRESTOR) 10 MG tablet Take by mouth. 12/02/17 12/02/18  [provider]  silver sulfADIAZINE (SILVADENE) 1 % cream Apply 1 application topically daily. 12/28/17   Stegmayer, Ranae Plumber, PA-C  traMADol (ULTRAM) 50 MG tablet Take 1 tablet (50 mg total) by mouth every 6 (six) hours as needed. Patient not  taking: Reported on 12/28/2017 06/09/17   Felecia Shelling, DPM  zolpidem (AMBIEN) 10 MG tablet Take 10 mg by mouth at bedtime as needed for sleep.  06/20/16   [provider]    Allergies Honey bee venom; Sulfacetamide sodium; Sulfamethoxazole-trimethoprim; Sulfasalazine; Levofloxacin; Sulfa antibiotics; and Codeine  Family History  Problem Relation Age of Onset  . CAD Mother   . Heart disease Mother        Pacemaker  . CAD Sister   . Heart disease Sister        Heart surgery x 1  . Throat cancer Brother   . CAD Maternal Grandmother   . CAD Maternal Grandfather     Social History Social History   Tobacco Use  . Smoking status: Never Smoker  . Smokeless tobacco: Never Used  Substance Use Topics  . Alcohol use: No  . Drug use: No    Comment: Patient denies, but per previous H&P from Psych- cocaine use in the past    Review of Systems  Constitutional: No fever/chills Eyes: No visual changes. ENT: No sore throat. Cardiovascular: Denies chest pain. Respiratory: Denies shortness of breath. Gastrointestinal: No abdominal pain.  No vomiting.  No diarrhea.  No constipation. Genitourinary: Negative for dysuria. Musculoskeletal: Negative for back pain. Skin: Negative for rash. Neurological: Negative for focal weakness or numbness.   ____________________________________________   PHYSICAL EXAM:  VITAL SIGNS: ED Triage Vitals  Enc Vitals Group     BP 11/20/18 1309 102/66     Pulse Rate 11/20/18 1309 70     Resp --      Temp 11/20/18 1309 97.8 F (36.6 C)     Temp Source 11/20/18 1309 Oral     SpO2 11/20/18 1309 100 %     Weight 11/20/18 1313 180 lb (81.6 kg)     Height 11/20/18 1313 5\' 2"  (1.575 m)     Head Circumference --      Peak Flow --      Pain Score 11/20/18 1313 8     Pain Loc --      Pain Edu? --      Excl. in GC? --     Constitutional: Alert and oriented.  Patient appears uncomfortable.  Holding vomitus bag. Eyes: Conjunctivae are normal.    Head: Large amount of blood to the  hair over the occiput.  Glass shards as well in the hair over the occiput.  I was able to remove 2 of the glass shards.  No pulsatile or active bleeding visualized.  Patient showered and rinsed out glass shards as well as blood.  Revealed a 7 cm curvilinear laceration to the right parietal region.  Oozing blood without pulsatile bleeding.  Down to adipose layer without skull or galea exposed.  Nose: No congestion/rhinnorhea. Mouth/Throat: Mucous membranes are moist.  Neck: No stridor.  No tenderness to the posterior cervical spine. Cardiovascular: Normal rate, regular rhythm. Grossly normal heart sounds.  No tenderness to the chest wall. Respiratory: Normal respiratory effort.  No retractions. Lungs CTAB. Gastrointestinal: Soft and nontender. No distention. No CVA tenderness. Musculoskeletal: No lower extremity tenderness nor edema.  No joint effusions. Neurologic:  Normal speech and language. No gross focal neurologic deficits are appreciated. Skin:  Skin is warm, dry and intact. No rash noted. Psychiatric: Mood and affect are normal. Speech and behavior are normal.  ____________________________________________   LABS (all labs ordered are listed, but only abnormal results are displayed)  Labs Reviewed - No data to display ____________________________________________  EKG   ____________________________________________  RADIOLOGY  CT head without any acute intracranial abnormalities. ____________________________________________   PROCEDURES  Procedure(s) performed:   Marland Kitchen.Marland Kitchen.Laceration Repair Date/Time: 11/20/2018 3:44 PM Performed by: Myrna BlazerSchaevitz, Elese Rane Matthew, MD Authorized by: Myrna BlazerSchaevitz, Garrell Flagg Matthew, MD   Consent:    Consent obtained:  Verbal   Consent given by:  Patient   Risks discussed:  Infection, pain, retained foreign body, poor cosmetic result and poor wound healing Anesthesia (see MAR for exact dosages):    Anesthesia method:   Local infiltration   Local anesthetic:  Lidocaine 1% WITH epi Laceration details:    Location:  Scalp   Scalp location:  L parietal   Length (cm):  7   Depth (mm):  4 Repair type:    Repair type:  Simple Exploration:    Hemostasis achieved with:  Direct pressure   Wound exploration: entire depth of wound probed and visualized     Contaminated: no (no foreign bodies visualized.  no glass in the wound.  patient asked does not have a foreign body sensation )   Treatment:    Area cleansed with:  Saline   Amount of cleaning:  Extensive   Irrigation solution:  Sterile saline   Irrigation method:  Pressure wash and syringe   Visualized foreign bodies/material removed: no   Skin repair:    Repair method:  Staples   Number of staples:  7 Approximation:    Approximation:  Close Post-procedure details:    Dressing:  Sterile dressing   Patient tolerance of procedure:  Tolerated well, no immediate complications    Critical Care performed:   ____________________________________________   INITIAL IMPRESSION / ASSESSMENT AND PLAN / ED COURSE  Pertinent labs & imaging results that were available during my care of the patient were reviewed by me and considered in my medical decision making (see chart for details).  DDX: Laceration, foreign body, head trauma, skull fracture, intracranial hemorrhage, concussion As part of my medical decision making, I reviewed the following data within the electronic MEDICAL RECORD NUMBER Notes from prior ED visits  ----------------------------------------- 3:45 PM on 11/20/2018 -----------------------------------------  Patient tolerated the suturing well.  Bleeding controlled.  Patient no longer nauseous.  Requesting pain meds.  Will give several Norco.  However, I stressed the use of ice as well as over-the-counter NSAIDs.  Patient  also knows to keep the laceration dry for the first 24 hours but then may shower normally.  Patient with persistent symptoms  although much more mild than previous.  We will diagnosed with concussion.  Fall was mechanical. ____________________________________________   FINAL CLINICAL IMPRESSION(S) / ED DIAGNOSES  Concussion.  Scalp laceration.  NEW MEDICATIONS STARTED DURING THIS VISIT:  New Prescriptions   No medications on file     Note:  This document was prepared using Dragon voice recognition software and may include unintentional dictation errors.     Myrna Blazer, MD 11/20/18 [phone]

## 2018-11-20 NOTE — ED Notes (Signed)
[facility] cleaned up pt's head and got most all of the blood washed off. Pt cleaned and dressed in dry clothes. Pt given warm blanket and repositioned back in bed at this time.

## 2018-12-02 ENCOUNTER — Other Ambulatory Visit: Payer: Self-pay

## 2018-12-02 ENCOUNTER — Emergency Department
Admission: EM | Admit: 2018-12-02 | Discharge: 2018-12-02 | Disposition: A | Discharge status: Home / Self Care | Payer: Medicare HMO | Attending: Emergency Medicine | Admitting: Emergency Medicine

## 2018-12-02 DIAGNOSIS — Z4802 Encounter for removal of sutures: Secondary | ICD-10-CM | POA: Insufficient documentation

## 2018-12-02 NOTE — ED Notes (Signed)
Pt placed in triage 3 for Dr. Manson Passey to removed staples.

## 2018-12-02 NOTE — ED Triage Notes (Signed)
Here for staple removal from head. A&O, ambulatory.

## 2018-12-02 NOTE — ED Notes (Signed)
Pt verbalized understanding of discharge instructions. NAD at this time. 

## 2019-01-07 DIAGNOSIS — F410 Panic disorder [episodic paroxysmal anxiety] without agoraphobia: Secondary | ICD-10-CM | POA: Diagnosis not present

## 2019-01-07 DIAGNOSIS — F4310 Post-traumatic stress disorder, unspecified: Secondary | ICD-10-CM | POA: Diagnosis not present

## 2019-01-07 DIAGNOSIS — R6889 Other general symptoms and signs: Secondary | ICD-10-CM | POA: Diagnosis not present

## 2019-01-07 DIAGNOSIS — Z79899 Other long term (current) drug therapy: Secondary | ICD-10-CM | POA: Diagnosis not present

## 2019-01-07 DIAGNOSIS — F331 Major depressive disorder, recurrent, moderate: Secondary | ICD-10-CM | POA: Diagnosis not present

## 2019-01-15 ENCOUNTER — Emergency Department: Payer: Medicare HMO

## 2019-01-15 ENCOUNTER — Other Ambulatory Visit: Payer: Self-pay

## 2019-01-15 ENCOUNTER — Emergency Department
Admission: EM | Admit: 2019-01-15 | Discharge: 2019-01-15 | Disposition: A | Discharge status: Home / Self Care | Payer: Medicare HMO | Attending: Emergency Medicine | Admitting: Emergency Medicine

## 2019-01-15 DIAGNOSIS — F419 Anxiety disorder, unspecified: Secondary | ICD-10-CM | POA: Diagnosis not present

## 2019-01-15 DIAGNOSIS — J441 Chronic obstructive pulmonary disease with (acute) exacerbation: Secondary | ICD-10-CM | POA: Diagnosis not present

## 2019-01-15 DIAGNOSIS — F1410 Cocaine abuse, uncomplicated: Secondary | ICD-10-CM | POA: Diagnosis not present

## 2019-01-15 DIAGNOSIS — R0602 Shortness of breath: Secondary | ICD-10-CM | POA: Diagnosis not present

## 2019-01-15 DIAGNOSIS — F319 Bipolar disorder, unspecified: Secondary | ICD-10-CM | POA: Diagnosis not present

## 2019-01-15 DIAGNOSIS — Z79899 Other long term (current) drug therapy: Secondary | ICD-10-CM | POA: Insufficient documentation

## 2019-01-15 DIAGNOSIS — I10 Essential (primary) hypertension: Secondary | ICD-10-CM | POA: Diagnosis not present

## 2019-01-15 DIAGNOSIS — R05 Cough: Secondary | ICD-10-CM | POA: Diagnosis not present

## 2019-01-15 LAB — BASIC METABOLIC PANEL
Anion gap: 7 (ref 5–15)
BUN: 13 mg/dL (ref 6–20)
CHLORIDE: 105 mmol/L (ref 98–111)
CO2: 28 mmol/L (ref 22–32)
CREATININE: 0.86 mg/dL (ref 0.44–1.00)
Calcium: 8.7 mg/dL — ABNORMAL LOW (ref 8.9–10.3)
GFR calc Af Amer: >60 mL/min (ref >60)
GFR calc non Af Amer: >60 mL/min (ref >60)
Glucose, Bld: 99 mg/dL (ref 70–99)
Potassium: 3.8 mmol/L (ref 3.5–5.1)
Sodium: 140 mmol/L (ref 135–145)

## 2019-01-15 LAB — CBC
HCT: 41 % (ref 36.0–46.0)
Hemoglobin: 13.6 g/dL (ref 12.0–15.0)
MCH: 30 pg (ref 26.0–34.0)
MCHC: 33.2 g/dL (ref 30.0–36.0)
MCV: 90.3 fL (ref 80.0–100.0)
Platelets: 281 10*3/uL (ref 150–400)
RBC: 4.54 MIL/uL (ref 3.87–5.11)
RDW: 12.9 % (ref 11.5–15.5)
WBC: 5.6 10*3/uL (ref 4.0–10.5)
nRBC: 0 % (ref 0.0–0.2)

## 2019-01-15 LAB — TROPONIN I: Troponin I: <0.03 ng/mL (ref <0.03)

## 2019-01-15 MED ORDER — METHYLPREDNISOLONE SODIUM SUCC 125 MG IJ SOLR: 125.0000 mg | Freq: Once | INTRAMUSCULAR | Status: DC

## 2019-01-15 MED ORDER — IPRATROPIUM-ALBUTEROL 0.5-2.5 (3) MG/3ML IN SOLN
3.0000 mL | Freq: Once | RESPIRATORY_TRACT | Status: AC
  Administered 2019-01-15: 3 mL via RESPIRATORY_TRACT
  Filled 2019-01-15: qty 3

## 2019-01-15 MED ORDER — METHYLPREDNISOLONE SODIUM SUCC 125 MG IJ SOLR
125.0000 mg | Freq: Once | INTRAMUSCULAR | Status: AC
  Administered 2019-01-15: 125 mg via INTRAVENOUS
  Filled 2019-01-15 (×2): qty 2

## 2019-01-15 MED ORDER — ALBUTEROL SULFATE (2.5 MG/3ML) 0.083% IN NEBU
5.0000 mg | INHALATION_SOLUTION | Freq: Once | RESPIRATORY_TRACT | Status: AC
  Administered 2019-01-15: 5 mg via RESPIRATORY_TRACT
  Filled 2019-01-15: qty 6

## 2019-01-15 MED ORDER — PREDNISONE 50 MG PO TABS: 50.0000 mg | ORAL_TABLET | Freq: Every day | ORAL | 0 refills | Status: AC

## 2019-01-15 MED ORDER — ALBUTEROL SULFATE (2.5 MG/3ML) 0.083% IN NEBU
2.5000 mg | INHALATION_SOLUTION | Freq: Once | RESPIRATORY_TRACT | Status: AC
  Administered 2019-01-15: 2.5 mg via RESPIRATORY_TRACT
  Filled 2019-01-15: qty 3

## 2019-01-15 NOTE — ED Provider Notes (Signed)
[facility] Emergency Department Provider Note   ____________________________________________    I have reviewed the triage vital signs and the nursing notes.   HISTORY  Chief Complaint Shortness of Breath and Wheezing     HPI Rebecca Ramos is a 57 y.o. female who presents with complaints of shortness of breath and wheezing.  Patient has a history of COPD although she has never smoked in her life her family did smoke.  She reports over the last week she has had worsening wheezing, chest tightness and shortness of breath.  No recent travel.  Positive dry cough.  Has not checked her temperature but has had chills.  No nausea or vomiting or diaphoresis.  Past Medical History:  Diagnosis Date  . Acid reflux   . Anxiety   . Chronic lower back pain    osteoarthritis in luber spine  . COPD (chronic obstructive pulmonary disease) (HCC)   . Depression    Bipolar  . Dysrhythmia    "skips" beats rx  . Heart murmur   . History of degenerative disc disease   . History of pulmonary embolus (PE)   . HTN (hypertension) 03/20/2015  . Hypertension   . Osteoarthritis of lumbosacral spine   . Peripheral vascular disease (HCC) 2013   had PE after hysterectomy  . Pneumonia    hx  . PSVT (paroxysmal supraventricular tachycardia) (HCC)   . Right Achilles tendinitis   . Thrombus    right lung in 2015, off anticoagulation now  . Transaminitis     Patient Active Problem List   Diagnosis Date Noted  . Pressure injury of skin of right heel 12/29/2017  . Pre-op evaluation 01/16/2017  . Paresthesia of left arm 12/08/2016  . Headache 11/10/2016  . Anxiety 11/10/2016  . Palpitations 11/09/2016  . Fatty liver 11/09/2016  . Abdominal pain, left upper quadrant 10/20/2016  . Nausea without vomiting 08/09/2016  . Pedal edema 08/09/2016  . Frequent urination 06/23/2016  . Chronic pain syndrome 06/23/2016  . GERD (gastroesophageal reflux disease) 06/10/2016  . COPD  (chronic obstructive pulmonary disease) (HCC) 06/08/2016  . Acute bronchitis 06/08/2016  . Hyperlipidemia 06/08/2016  . Obesity 06/08/2016  . Cocaine abuse (HCC) 06/08/2016  . Tobacco abuse counseling 06/08/2016  . Atypical chest pain 06/06/2016  . Cocaine use disorder, moderate, dependence (HCC) 03/28/2016  . Severe recurrent major depression without psychotic features (HCC) 03/26/2016  . Suicide attempt (HCC) 03/26/2016  . Chronic back pain 03/26/2016  . HTN (hypertension) 03/20/2015    Past Surgical History:  Procedure Laterality Date  . ABDOMINAL HYSTERECTOMY    . ACHILLES TENDON SURGERY Right 02/09/2017   Procedure: ACHILLES TENDON REPAIR;  Surgeon: Felecia Shelling, DPM;  Location: [facility] OR;  Service: Podiatry;  Laterality: Right;  . ANKLE ARTHROSCOPY Right 02/09/2017   Procedure: ANKLE ARTHROSCOPY RIGHT FOOT;  Surgeon: Felecia Shelling, DPM;  Location: [facility] OR;  Service: Podiatry;  Laterality: Right;  . CALCANEAL OSTEOTOMY Right 02/09/2017   Procedure: RETRO CALCANEOUS EXOSTECTOMY;  Surgeon: Felecia Shelling, DPM;  Location: [facility] OR;  Service: Podiatry;  Laterality: Right;  . CESAREAN SECTION Bilateral   . CESAREAN SECTION Bilateral   . ELBOW SURGERY     bilateral  . thumb surgery     right   . TONSILLECTOMY      Prior to Admission medications   Medication Sig Start Date End Date Taking? Authorizing Provider  ALPRAZolam Prudy Feeler) 1 MG tablet Take 1 mg by mouth 4 (four)  times daily as needed for anxiety.  05/18/16   [provider]  Blood Pressure Monitoring (BLOOD PRESSURE CUFF) MISC 1 each by Does not apply route daily. 06/16/16   Loura Pardon, NP  budesonide-formoterol (SYMBICORT) 160-4.5 MCG/ACT inhaler Inhale 2 puffs into the lungs 2 (two) times daily. 09/27/16 09/27/17  Glori Luis, MD  ciprofloxacin (CIPRO) 500 MG tablet Take 1 tablet (500 mg total) by mouth 2 (two) times daily. Patient not taking: Reported on 12/28/2017 12/19/17   Felecia Shelling, DPM  COMBIVENT RESPIMAT  20-100 MCG/ACT AERS respimat Inhale 1 puff into the lungs 4 (four) times daily as needed. For wheezing. Patient not taking: Reported on 12/28/2017 06/10/16   Loura Pardon, NP  cyanocobalamin (,VITAMIN B-12,) 1000 MCG/ML injection Inject 1,000 mcg into the muscle every 30 (thirty) days.  01/13/16   [provider]  diltiazem (CARDIZEM CD) 180 MG 24 hr capsule Take 1 capsule (180 mg total) by mouth daily. 11/29/16 02/27/17  End, Cristal Deer, MD  enoxaparin (LOVENOX) 40 MG/0.4ML injection Inject 0.4 mLs (40 mg total) into the skin daily. Please take one syringe with you day of surgery so someone can show you how to use it. 02/01/17   Logan Bores, Larena Glassman, DPM  esomeprazole (NEXIUM) 20 MG capsule TAKE ONE CAPSULE BY MOUTH TWICE DAILY BEFORE A MEAL 04/12/17   Glori Luis, MD  fluticasone [facility]) 50 MCG/ACT nasal spray Place 2 sprays into both nostrils daily as needed for rhinitis.     [provider]  furosemide (LASIX) 20 MG tablet TAKE 1 TABLET(20 MG) BY MOUTH DAILY 09/30/16   Glori Luis, MD  gabapentin (NEURONTIN) 300 MG capsule Take 1 capsule (300 mg total) by mouth 2 (two) times daily. Patient not taking: Reported on 12/28/2017 09/08/17   Felecia Shelling, DPM  gabapentin (NEURONTIN) 600 MG tablet Take 1 tablet (600 mg total) by mouth 3 (three) times daily. Patient not taking: Reported on 12/28/2017 10/03/17   Felecia Shelling, DPM  gentamicin cream (GARAMYCIN) 0.1 % Apply 1 application topically 3 (three) times daily. 10/03/17   Felecia Shelling, DPM  gentamicin cream (GARAMYCIN) 0.1 % Apply 1 application topically 3 (three) times daily. 04/03/18   Felecia Shelling, DPM  HYDROcodone-acetaminophen (NORCO) 5-325 MG tablet Take 1 tablet by mouth every 4 (four) hours as needed for moderate pain. 10/23/18   Willy Eddy, MD  HYDROmorphone (DILAUDID) 4 MG tablet Take 1 tablet (4 mg total) by mouth every 4 (four) hours as needed for severe pain. 04/24/17   Felecia Shelling, DPM    hydrOXYzine (VISTARIL) 50 MG capsule Take 50 mg by mouth 3 (three) times daily as needed for itching. 01/29/17   [provider]  ibuprofen (ADVIL,MOTRIN) 600 MG tablet Take 1 tablet (600 mg total) by mouth every 8 (eight) hours as needed. 01/08/17   Merrily Brittle, MD  levofloxacin (LEVAQUIN) 500 MG tablet Take 500 mg by mouth daily.    [provider]  LYRICA 75 MG capsule  12/18/17   [provider]  mupirocin ointment (BACTROBAN) 2 % Place 1 application into the nose 2 (two) times daily. 12/19/17   Felecia Shelling, DPM  NONFORMULARY OR COMPOUNDED ITEM See pharmacy note 06/06/17   Felecia Shelling, DPM  omega-3 acid ethyl esters (LOVAZA) 1 g capsule Take 2 g by mouth daily.  06/17/16   [provider]  oxyCODONE-acetaminophen (PERCOCET) 5-325 MG tablet Take 1 tablet by mouth every 4 (four) hours  as needed for moderate pain or severe pain. 11/20/18 11/20/19  Myrna Blazer, MD  polyethylene glycol powder (GLYCOLAX/MIRALAX) powder Take 17 g by mouth daily. Patient taking differently: Take 17 g by mouth daily as needed (for constipation.).  10/21/16   Glori Luis, MD  predniSONE (DELTASONE) 50 MG tablet Take 1 tablet (50 mg total) by mouth daily with breakfast. 01/15/19   Jene Every, MD  ranitidine (ZANTAC) 150 MG tablet Take 150 mg by mouth at bedtime.     [provider]  rosuvastatin (CRESTOR) 10 MG tablet Take by mouth. 12/02/17 12/02/18  [provider]  silver sulfADIAZINE (SILVADENE) 1 % cream Apply 1 application topically daily. 12/28/17   Stegmayer, Ranae Plumber, PA-C  traMADol (ULTRAM) 50 MG tablet Take 1 tablet (50 mg total) by mouth every 6 (six) hours as needed. Patient not taking: Reported on 12/28/2017 06/09/17   Felecia Shelling, DPM  zolpidem (AMBIEN) 10 MG tablet Take 10 mg by mouth at bedtime as needed for sleep.  06/20/16   [provider]     Allergies Honey bee venom; Sulfacetamide sodium;  Sulfamethoxazole-trimethoprim; Sulfasalazine; Levofloxacin; Sulfa antibiotics; and Codeine  Family History  Problem Relation Age of Onset  . CAD Mother   . Heart disease Mother        Pacemaker  . CAD Sister   . Heart disease Sister        Heart surgery x 1  . Throat cancer Brother   . CAD Maternal Grandmother   . CAD Maternal Grandfather     Social History Social History   Tobacco Use  . Smoking status: Never Smoker  . Smokeless tobacco: Never Used  Substance Use Topics  . Alcohol use: No  . Drug use: No    Comment: Patient denies, but per previous H&P from Psych- cocaine use in the past    Review of Systems  Constitutional: As above Eyes: No visual changes.  ENT: No sore throat. Cardiovascular: Chest tightness. Respiratory: As above Gastrointestinal: No abdominal pain. Genitourinary: Negative for dysuria. Musculoskeletal: Negative for back pain. Skin: Negative for rash. Neurological: Negative for headaches    ____________________________________________   PHYSICAL EXAM:  VITAL SIGNS: ED Triage Vitals  Enc Vitals Group     BP 01/15/19 1557 126/90     Pulse Rate 01/15/19 1557 64     Resp --      Temp 01/15/19 1557 98.3 F (36.8 C)     Temp Source 01/15/19 1557 Oral     SpO2 01/15/19 1557 100 %     Weight 01/15/19 1558 86.2 kg (190 lb)     Height 01/15/19 1558 1.575 m (5\' 2" )     Head Circumference --      Peak Flow --      Pain Score 01/15/19 1605 8     Pain Loc --      Pain Edu? --      Excl. in GC? --     Constitutional: Alert and oriented.  Eyes: Conjunctivae are normal.  . Nose: No congestion/rhinnorhea. Mouth/Throat: Mucous membranes are moist.    Cardiovascular: Normal rate, regular rhythm. Grossly normal heart sounds.  Good peripheral circulation. Respiratory: Increased respiratory effort with mild tachypnea, no retractions, diffuse wheezing Gastrointestinal: Soft and nontender. No distention.   Musculoskeletal: No lower extremity  tenderness nor edema.  Warm and well perfused Neurologic:  Normal speech and language. No gross focal neurologic deficits are appreciated.  Skin:  Skin is warm, dry and  intact. No rash noted. Psychiatric: Mood and affect are normal. Speech and behavior are normal.  ____________________________________________   LABS (all labs ordered are listed, but only abnormal results are displayed)  Labs Reviewed  BASIC METABOLIC PANEL - Abnormal; Notable for the following components:      Result Value   Calcium 8.7 (*)    All other components within normal limits  CBC  TROPONIN I   ____________________________________________  EKG  ED ECG REPORT I, Jene Every, the attending physician, personally viewed and interpreted this ECG.  Date: 01/15/2019  Rhythm: normal sinus rhythm QRS Axis: normal Intervals: normal ST/T Wave abnormalities: normal Narrative Interpretation: no evidence of acute ischemia  ____________________________________________  RADIOLOGY  Chest x-ray unremarkable ____________________________________________   PROCEDURES  Procedure(s) performed: No  Procedures   Critical Care performed: No ____________________________________________   INITIAL IMPRESSION / ASSESSMENT AND PLAN / ED COURSE  Pertinent labs & imaging results that were available during my care of the patient were reviewed by me and considered in my medical decision making (see chart for details).  Patient presents with shortness of breath, chest tightness and wheezing on exam.  Consistent with COPD exacerbation.  Lab work is reassuring, chest x-ray negative for pneumonia.  Will treat with IV Solu-Medrol, multiple duo nebs and reevaluate  Patient with significant improvement in symptoms after treatment, will give an additional DuoNeb and reevaluate  Patient notes her breathing is much better after additional DuoNeb, she would like to go home, she does still have some scattered wheezes but she  notes that she can always come back if she has any worsening symptoms.  I feel this is a reasonable plan, will discharge with prednisone p.o., albuterol every 4 hours 2 puffs, strict return precautions    ____________________________________________   FINAL CLINICAL IMPRESSION(S) / ED DIAGNOSES  Final diagnoses:  COPD exacerbation (HCC)        Note:  This document was prepared using Dragon voice recognition software and may include unintentional dictation errors.   Jene Every, MD 01/15/19 2049

## 2019-01-15 NOTE — ED Triage Notes (Addendum)
Cough and wheezing X 2 weeks. Other members in house dx with strep. Reports worsening over last week. Wheezing at time of triage, nonproductive cough. Pt in NAD. Chest tightness, worse with coughing. Rib pain with coughing.

## 2019-02-18 DIAGNOSIS — Z0100 Encounter for examination of eyes and vision without abnormal findings: Secondary | ICD-10-CM | POA: Diagnosis not present

## 2019-09-03 ENCOUNTER — Ambulatory Visit: Payer: Medicare HMO | Admitting: Podiatry
